# Patient Record
Sex: Female | Born: 1935 | Race: White | Hispanic: No | Marital: Married | Smoking: Former smoker
Health system: Southern US, Community
[De-identification: ages and names within clinical notes are randomized; demographics above are authoritative.]

## PROBLEM LIST (undated history)

## (undated) DIAGNOSIS — K529 Noninfective gastroenteritis and colitis, unspecified: Secondary | ICD-10-CM

## (undated) DIAGNOSIS — I4891 Unspecified atrial fibrillation: Secondary | ICD-10-CM

## (undated) DIAGNOSIS — I10 Essential (primary) hypertension: Secondary | ICD-10-CM

## (undated) DIAGNOSIS — G459 Transient cerebral ischemic attack, unspecified: Secondary | ICD-10-CM

## (undated) DIAGNOSIS — Z7901 Long term (current) use of anticoagulants: Secondary | ICD-10-CM

## (undated) DIAGNOSIS — I351 Nonrheumatic aortic (valve) insufficiency: Secondary | ICD-10-CM

## (undated) DIAGNOSIS — H9313 Tinnitus, bilateral: Secondary | ICD-10-CM

## (undated) DIAGNOSIS — K219 Gastro-esophageal reflux disease without esophagitis: Secondary | ICD-10-CM

## (undated) DIAGNOSIS — I639 Cerebral infarction, unspecified: Secondary | ICD-10-CM

## (undated) DIAGNOSIS — E785 Hyperlipidemia, unspecified: Secondary | ICD-10-CM

## (undated) DIAGNOSIS — C4331 Malignant melanoma of nose: Secondary | ICD-10-CM

## (undated) DIAGNOSIS — R0602 Shortness of breath: Secondary | ICD-10-CM

## (undated) DIAGNOSIS — M1990 Unspecified osteoarthritis, unspecified site: Secondary | ICD-10-CM

## (undated) DIAGNOSIS — H409 Unspecified glaucoma: Secondary | ICD-10-CM

## (undated) DIAGNOSIS — F419 Anxiety disorder, unspecified: Secondary | ICD-10-CM

## (undated) HISTORY — DX: Shortness of breath: R06.02

## (undated) HISTORY — DX: Noninfective gastroenteritis and colitis, unspecified: K52.9

## (undated) HISTORY — PX: TONSILLECTOMY: SUR1361

## (undated) HISTORY — DX: Nonrheumatic aortic (valve) insufficiency: I35.1

## (undated) HISTORY — DX: Malignant melanoma of nose: C43.31

## (undated) HISTORY — DX: Hyperlipidemia, unspecified: E78.5

## (undated) HISTORY — DX: Cerebral infarction, unspecified: I63.9

## (undated) HISTORY — DX: Long term (current) use of anticoagulants: Z79.01

## (undated) HISTORY — PX: BLEPHAROPLASTY: SUR158

## (undated) HISTORY — PX: OTHER SURGICAL HISTORY: SHX169

## (undated) HISTORY — DX: Unspecified osteoarthritis, unspecified site: M19.90

## (undated) HISTORY — DX: Unspecified atrial fibrillation: I48.91

## (undated) HISTORY — PX: CARDIOVERSION: SHX1299

## (undated) HISTORY — DX: Anxiety disorder, unspecified: F41.9

## (undated) HISTORY — PX: FRACTURE SURGERY: SHX138

## (undated) HISTORY — DX: Essential (primary) hypertension: I10

## (undated) HISTORY — DX: Gastro-esophageal reflux disease without esophagitis: K21.9

---

## 1997-09-09 ENCOUNTER — Ambulatory Visit (HOSPITAL_COMMUNITY): Admission: RE | Admit: 1997-09-09 | Discharge: 1997-09-09 | Payer: Self-pay | Admitting: Rheumatology

## 1997-11-04 ENCOUNTER — Other Ambulatory Visit: Admission: RE | Admit: 1997-11-04 | Discharge: 1997-11-04 | Payer: Self-pay | Admitting: Obstetrics & Gynecology

## 1998-11-30 ENCOUNTER — Other Ambulatory Visit: Admission: RE | Admit: 1998-11-30 | Discharge: 1998-11-30 | Payer: Self-pay | Admitting: Obstetrics & Gynecology

## 1999-01-05 ENCOUNTER — Ambulatory Visit (HOSPITAL_COMMUNITY): Admission: RE | Admit: 1999-01-05 | Discharge: 1999-01-05 | Payer: Self-pay | Admitting: Obstetrics & Gynecology

## 2001-08-04 ENCOUNTER — Encounter: Payer: Self-pay | Admitting: Orthopedic Surgery

## 2001-08-04 ENCOUNTER — Encounter: Payer: Self-pay | Admitting: Emergency Medicine

## 2001-08-04 ENCOUNTER — Inpatient Hospital Stay (HOSPITAL_COMMUNITY): Admission: EM | Admit: 2001-08-04 | Discharge: 2001-08-07 | Payer: Self-pay | Admitting: Emergency Medicine

## 2001-09-15 ENCOUNTER — Encounter: Payer: Self-pay | Admitting: Emergency Medicine

## 2001-09-15 ENCOUNTER — Inpatient Hospital Stay (HOSPITAL_COMMUNITY): Admission: EM | Admit: 2001-09-15 | Discharge: 2001-09-16 | Payer: Self-pay | Admitting: Emergency Medicine

## 2001-09-16 ENCOUNTER — Encounter: Payer: Self-pay | Admitting: Cardiology

## 2001-10-16 ENCOUNTER — Inpatient Hospital Stay (HOSPITAL_COMMUNITY): Admission: EM | Admit: 2001-10-16 | Discharge: 2001-10-17 | Payer: Self-pay

## 2002-02-11 ENCOUNTER — Other Ambulatory Visit: Admission: RE | Admit: 2002-02-11 | Discharge: 2002-02-11 | Payer: Self-pay | Admitting: Obstetrics & Gynecology

## 2002-08-26 ENCOUNTER — Encounter: Payer: Self-pay | Admitting: Rheumatology

## 2002-08-26 ENCOUNTER — Encounter: Admission: RE | Admit: 2002-08-26 | Discharge: 2002-08-26 | Payer: Self-pay | Admitting: Rheumatology

## 2002-09-25 ENCOUNTER — Encounter: Payer: Self-pay | Admitting: Rheumatology

## 2002-09-25 ENCOUNTER — Encounter: Admission: RE | Admit: 2002-09-25 | Discharge: 2002-09-25 | Payer: Self-pay | Admitting: Rheumatology

## 2002-12-12 ENCOUNTER — Encounter: Admission: RE | Admit: 2002-12-12 | Discharge: 2002-12-12 | Payer: Self-pay | Admitting: Internal Medicine

## 2003-01-28 ENCOUNTER — Observation Stay (HOSPITAL_COMMUNITY): Admission: RE | Admit: 2003-01-28 | Discharge: 2003-01-29 | Payer: Self-pay | Admitting: Orthopedic Surgery

## 2003-04-27 ENCOUNTER — Encounter (INDEPENDENT_AMBULATORY_CARE_PROVIDER_SITE_OTHER): Payer: Self-pay | Admitting: Specialist

## 2003-04-27 ENCOUNTER — Ambulatory Visit (HOSPITAL_COMMUNITY): Admission: RE | Admit: 2003-04-27 | Discharge: 2003-04-27 | Payer: Self-pay | Admitting: Gastroenterology

## 2004-03-29 ENCOUNTER — Ambulatory Visit: Payer: Self-pay | Admitting: Pulmonary Disease

## 2004-04-19 ENCOUNTER — Ambulatory Visit: Payer: Self-pay | Admitting: Internal Medicine

## 2004-05-04 ENCOUNTER — Emergency Department (HOSPITAL_COMMUNITY): Admission: EM | Admit: 2004-05-04 | Discharge: 2004-05-04 | Payer: Self-pay | Admitting: Emergency Medicine

## 2004-05-06 ENCOUNTER — Encounter: Admission: RE | Admit: 2004-05-06 | Discharge: 2004-05-06 | Payer: Self-pay | Admitting: Internal Medicine

## 2004-05-06 ENCOUNTER — Ambulatory Visit: Payer: Self-pay | Admitting: Internal Medicine

## 2004-05-18 ENCOUNTER — Ambulatory Visit: Payer: Self-pay | Admitting: Internal Medicine

## 2004-06-01 ENCOUNTER — Ambulatory Visit: Payer: Self-pay | Admitting: Internal Medicine

## 2004-06-02 ENCOUNTER — Encounter (INDEPENDENT_AMBULATORY_CARE_PROVIDER_SITE_OTHER): Payer: Self-pay | Admitting: *Deleted

## 2004-06-02 ENCOUNTER — Ambulatory Visit: Admission: RE | Admit: 2004-06-02 | Discharge: 2004-06-02 | Payer: Self-pay | Admitting: Internal Medicine

## 2004-06-02 ENCOUNTER — Ambulatory Visit: Payer: Self-pay | Admitting: Internal Medicine

## 2004-06-07 ENCOUNTER — Ambulatory Visit: Payer: Self-pay | Admitting: Internal Medicine

## 2004-07-20 ENCOUNTER — Ambulatory Visit: Payer: Self-pay | Admitting: Pulmonary Disease

## 2004-08-22 ENCOUNTER — Other Ambulatory Visit: Admission: RE | Admit: 2004-08-22 | Discharge: 2004-08-22 | Payer: Self-pay | Admitting: Obstetrics & Gynecology

## 2004-08-24 ENCOUNTER — Ambulatory Visit: Payer: Self-pay | Admitting: Internal Medicine

## 2004-10-06 ENCOUNTER — Ambulatory Visit: Payer: Self-pay | Admitting: Internal Medicine

## 2004-12-26 ENCOUNTER — Ambulatory Visit: Payer: Self-pay | Admitting: Internal Medicine

## 2005-01-24 ENCOUNTER — Ambulatory Visit: Payer: Self-pay | Admitting: Internal Medicine

## 2005-02-14 ENCOUNTER — Ambulatory Visit: Payer: Self-pay | Admitting: Internal Medicine

## 2005-02-24 ENCOUNTER — Ambulatory Visit: Payer: Self-pay | Admitting: Internal Medicine

## 2005-03-17 ENCOUNTER — Ambulatory Visit: Payer: Self-pay | Admitting: Internal Medicine

## 2005-04-03 ENCOUNTER — Ambulatory Visit (HOSPITAL_COMMUNITY): Admission: RE | Admit: 2005-04-03 | Discharge: 2005-04-03 | Payer: Self-pay | Admitting: Internal Medicine

## 2006-05-29 ENCOUNTER — Inpatient Hospital Stay (HOSPITAL_COMMUNITY): Admission: EM | Admit: 2006-05-29 | Discharge: 2006-05-31 | Payer: Self-pay | Admitting: Emergency Medicine

## 2006-05-30 ENCOUNTER — Ambulatory Visit: Payer: Self-pay | Admitting: Vascular Surgery

## 2006-07-13 ENCOUNTER — Ambulatory Visit: Payer: Self-pay | Admitting: Internal Medicine

## 2006-07-24 ENCOUNTER — Encounter: Admission: RE | Admit: 2006-07-24 | Discharge: 2006-07-24 | Payer: Self-pay | Admitting: Rheumatology

## 2007-03-04 ENCOUNTER — Emergency Department (HOSPITAL_COMMUNITY): Admission: EM | Admit: 2007-03-04 | Discharge: 2007-03-04 | Payer: Self-pay | Admitting: Emergency Medicine

## 2007-03-29 ENCOUNTER — Emergency Department (HOSPITAL_COMMUNITY): Admission: EM | Admit: 2007-03-29 | Discharge: 2007-03-29 | Payer: Self-pay | Admitting: Emergency Medicine

## 2007-04-05 ENCOUNTER — Ambulatory Visit (HOSPITAL_COMMUNITY): Admission: RE | Admit: 2007-04-05 | Discharge: 2007-04-05 | Payer: Self-pay | Admitting: Cardiovascular Disease

## 2008-08-14 ENCOUNTER — Encounter: Admission: RE | Admit: 2008-08-14 | Discharge: 2008-08-14 | Payer: Self-pay | Admitting: Family Medicine

## 2008-08-27 ENCOUNTER — Ambulatory Visit (HOSPITAL_COMMUNITY): Admission: RE | Admit: 2008-08-27 | Discharge: 2008-08-27 | Payer: Self-pay | Admitting: Family Medicine

## 2008-08-27 ENCOUNTER — Encounter (INDEPENDENT_AMBULATORY_CARE_PROVIDER_SITE_OTHER): Payer: Self-pay | Admitting: Family Medicine

## 2008-08-27 ENCOUNTER — Ambulatory Visit: Payer: Self-pay | Admitting: Vascular Surgery

## 2009-05-06 ENCOUNTER — Encounter: Admission: RE | Admit: 2009-05-06 | Discharge: 2009-05-06 | Payer: Self-pay | Admitting: Family Medicine

## 2009-05-13 ENCOUNTER — Encounter: Admission: RE | Admit: 2009-05-13 | Discharge: 2009-05-13 | Payer: Self-pay | Admitting: Gastroenterology

## 2009-10-13 ENCOUNTER — Ambulatory Visit: Payer: Self-pay | Admitting: Cardiology

## 2009-11-08 ENCOUNTER — Encounter: Payer: Self-pay | Admitting: Emergency Medicine

## 2009-11-10 ENCOUNTER — Ambulatory Visit: Payer: Self-pay | Admitting: Cardiovascular Disease

## 2009-12-08 ENCOUNTER — Ambulatory Visit: Payer: Self-pay | Admitting: Cardiovascular Disease

## 2009-12-13 DIAGNOSIS — I4891 Unspecified atrial fibrillation: Secondary | ICD-10-CM

## 2009-12-13 DIAGNOSIS — M109 Gout, unspecified: Secondary | ICD-10-CM

## 2009-12-13 DIAGNOSIS — K219 Gastro-esophageal reflux disease without esophagitis: Secondary | ICD-10-CM | POA: Insufficient documentation

## 2009-12-13 DIAGNOSIS — I10 Essential (primary) hypertension: Secondary | ICD-10-CM

## 2009-12-13 DIAGNOSIS — C4330 Malignant melanoma of unspecified part of face: Secondary | ICD-10-CM

## 2009-12-13 DIAGNOSIS — M810 Age-related osteoporosis without current pathological fracture: Secondary | ICD-10-CM | POA: Insufficient documentation

## 2009-12-13 DIAGNOSIS — G459 Transient cerebral ischemic attack, unspecified: Secondary | ICD-10-CM | POA: Insufficient documentation

## 2009-12-13 DIAGNOSIS — K5289 Other specified noninfective gastroenteritis and colitis: Secondary | ICD-10-CM | POA: Insufficient documentation

## 2009-12-13 DIAGNOSIS — E785 Hyperlipidemia, unspecified: Secondary | ICD-10-CM

## 2009-12-14 ENCOUNTER — Ambulatory Visit: Payer: Self-pay | Admitting: Emergency Medicine

## 2009-12-14 DIAGNOSIS — R05 Cough: Secondary | ICD-10-CM

## 2009-12-14 DIAGNOSIS — R059 Cough, unspecified: Secondary | ICD-10-CM | POA: Insufficient documentation

## 2009-12-14 DIAGNOSIS — R911 Solitary pulmonary nodule: Secondary | ICD-10-CM

## 2009-12-22 ENCOUNTER — Ambulatory Visit: Payer: Self-pay | Admitting: Cardiovascular Disease

## 2010-01-04 ENCOUNTER — Encounter: Admission: RE | Admit: 2010-01-04 | Discharge: 2010-01-04 | Payer: Self-pay | Admitting: Family Medicine

## 2010-01-05 ENCOUNTER — Ambulatory Visit: Payer: Self-pay | Admitting: Emergency Medicine

## 2010-01-19 ENCOUNTER — Ambulatory Visit: Payer: Self-pay | Admitting: Cardiovascular Disease

## 2010-02-11 ENCOUNTER — Ambulatory Visit: Payer: Self-pay | Admitting: Emergency Medicine

## 2010-02-23 ENCOUNTER — Ambulatory Visit: Payer: Self-pay | Admitting: Cardiology

## 2010-03-23 ENCOUNTER — Ambulatory Visit: Payer: Self-pay | Admitting: Cardiovascular Disease

## 2010-03-31 ENCOUNTER — Ambulatory Visit: Payer: Self-pay | Admitting: Cardiovascular Disease

## 2010-04-03 ENCOUNTER — Encounter: Payer: Self-pay | Admitting: Gastroenterology

## 2010-04-14 NOTE — Assessment & Plan Note (Signed)
Summary: cough, nodule   Visit Type:  Follow-up Copy to:  Antony Haste Primary Cleda Imel/Referring Mitchelle Sultan:  Dr. Antony Haste  CC:  Cough follow-up...the patient says her cough has improved...no longer using nasal spray due to nose bleeds.  History of Present Illness: 77 yo former smoker, HTN, A Fib on coumadin, GERD, chronic recurrent cough followed previously by Dr Sherene Sires. Worked up in the past with negative sinus CT scan in 2006, a negative chest CT scan in 2006, a negative bronchoscopy also in 2006, with no evidence of eosinophilia and a negative methacholine challenge test in January of 2007. PFT in 03/2005 showed mixed moderate obstruction and restriction.   12/14/09 -- Presents back for dry cough that began this Summer. Exposed to URI in August, developed scratchy throat, nasal drainage and congestion. Began to have cough prod of greenish mucous. Was treated w symptomatic relief and abx x 3. The nasal symptoms improved but left w dry cough. Bothers her all day, doesn't wake her from sleep. + globus sensation, + throat clearing. Occas nasal congestion. No real dyspnea, able to maintain her exercise regimen. CXR showed clear lungs, R parasternal nodule  01/04/10 -- 77 yo, chronic cough. Taking loratadine, stopped the nasonex 2 days ago due to nose bleed. Has taken the prilosec two times a day up until today. Cough is much better! Worked hard on not throat clearing. Planning for repeat CXR Dec  per Dr Cyndia Bent.   Current Medications (verified): 1)  Plavix 75 Mg Tabs (Clopidogrel Bisulfate) .Marland Kitchen.. 1 By Mouth Daily 2)  Zocor 40 Mg Tabs (Simvastatin) .Marland Kitchen.. 1 By Mouth Daily 3)  Toprol Xl 50 Mg Xr24h-Tab (Metoprolol Succinate) .Marland Kitchen.. 1 By Mouth Daily 4)  Digoxin 0.25 Mg Tabs (Digoxin) .Marland Kitchen.. 1 By Mouth Daily 5)  Benicar 20 Mg Tabs (Olmesartan Medoxomil) .Marland Kitchen.. 1 By Mouth Daily 6)  Coumadin 5 Mg Tabs (Warfarin Sodium) .... Use As Directed 7)  Prilosec 20 Mg Cpdr (Omeprazole) .Marland Kitchen.. 1 By Mouth Daily 8)   Centrum  Tabs (Multiple Vitamins-Minerals) .Marland Kitchen.. 1 By Mouth Daily 9)  Citracal Plus  Tabs (Multiple Minerals-Vitamins) .Marland Kitchen.. 1 By Mouth Daily 10)  Vitamin D 2000 Unit Tabs (Cholecalciferol) .Marland Kitchen.. 1 By Mouth Every Other Day 11)  Aspirin 81 Mg Tbec (Aspirin) .Marland Kitchen.. 1 By Mouth Daily 12)  Loratadine 10 Mg Tabs (Loratadine) .Marland Kitchen.. 1 By Mouth Once Daily  Allergies (verified): 1)  ! * Levaquin 2)  ! * Fosamax 3)  ! * Tramadol 4)  ! * Sulfa  Vital Signs:  Patient profile:   77 year old female Height:      67 inches (170.18 cm) Weight:      143 pounds (65 kg) BMI:     22.48 O2 Sat:      96 % on Room air Temp:     98.0 degrees F (36.67 degrees C) oral Pulse rate:   77 / minute BP sitting:   128 / 74  (left arm) Cuff size:   regular  Vitals Entered By: Michel Bickers CMA (January 05, 2010 1:25 PM)  O2 Sat at Rest %:  96 O2 Flow:  Room air CC: Cough follow-up...the patient says her cough has improved...no longer using nasal spray due to nose bleeds Comments Medications reviewed with patient Michel Bickers CMA  January 05, 2010 1:35 PM   Physical Exam  General:  normal appearance and healthy appearing.   Head:  normocephalic and atraumatic Eyes:  conjunctiva and sclera clear Nose:  no deformity, discharge, inflammation, or  lesions Mouth:  no deformity or lesions Neck:  no masses, thyromegaly, or abnormal cervical nodes, no stridor Lungs:  clear bilaterally to auscultation Heart:  regular rate and rhythm, S1, S2 without murmurs, rubs, gallops, or clicks Abdomen:  not examined Msk:  no deformity or scoliosis noted with normal posture Extremities:  no clubbing, cyanosis, edema, or deformity noted Neurologic:  non-focal Skin:  intact without lesions or rashes Psych:  alert and cooperative; normal mood and affect; normal attention span and concentration   Impression & Recommendations:  Problem # 1:  COUGH (ICD-786.2)  Orders: Est. Patient Level IV (04540)  Problem # 2:  PULMONARY NODULE  (ICD-518.89)  f/u CXR next time.   Orders: Est. Patient Level IV (98119)  Medications Added to Medication List This Visit: 1)  Zocor 40 Mg Tabs (Simvastatin) .Marland Kitchen.. 1 by mouth daily  Patient Instructions: 1)  Decrease omeprazole 20mg  to once daily  2)  Continue the loratadine 10mg  once daily  3)  Stop nasonex.  4)  Avoid throat clearing if possible 5)  Get your CXR as planned 6)  Follow up with Dr Delton Coombes in December. Please bring your CXRs with you to your appointment.    Immunization History:  Influenza Immunization History:    Influenza:  historical (12/29/2009)

## 2010-04-14 NOTE — Assessment & Plan Note (Signed)
Summary: cough, nodule   Visit Type:  Initial Consult Copy to:  Antony Haste Primary Provider/Referring Provider:  Dr. Antony Haste  CC:  Pulmonary consult...last seen by Dr. Sherene Sires in 2008...here for cough.  History of Present Illness: 77 yo former smoker, HTN, A Fib on coumadin, GERD, chronic recurrent cough followed previously by Dr Sherene Sires. Worked up in the past with negative sinus CT scan in 2006, a negative chest CT scan in 2006, a negative bronchoscopy also in 2006, with no evidence of eosinophilia and a negative methacholine challenge test in January of 2007. PFT in 03/2005 showed mixed moderate obstruction and restriction.   12/14/09 -- Presents back for dry cough that began this Summer. Exposed to URI in August, developed scratchy throat, nasal drainage and congestion. Began to have cough prod of greenish mucous. Was treated w symptomatic relief and abx x 3. The nasal symptoms improved but left w dry cough. Bothers her all day, doesn't wake her from sleep. + globus sensation, + throat clearing. Occas nasal congestion. No real dyspnea, able to maintain her exercise regimen. CXR showed clear lungs, R parasternal nodule.   Preventive Screening-Counseling & Management  Alcohol-Tobacco     Smoking Status: quit     Packs/Day: 0.75     Year Started: 1972     Year Quit: 1992     Pack years: 15  Current Medications (verified): 1)  Plavix 75 Mg Tabs (Clopidogrel Bisulfate) .Marland Kitchen.. 1 By Mouth Daily 2)  Zocor 80 Mg Tabs (Simvastatin) .Marland Kitchen.. 1 By Mouth Daily 3)  Toprol Xl 50 Mg Xr24h-Tab (Metoprolol Succinate) .Marland Kitchen.. 1 By Mouth Daily 4)  Digoxin 0.25 Mg Tabs (Digoxin) .Marland Kitchen.. 1 By Mouth Daily 5)  Benicar 20 Mg Tabs (Olmesartan Medoxomil) .Marland Kitchen.. 1 By Mouth Daily 6)  Coumadin 5 Mg Tabs (Warfarin Sodium) .... Use As Directed 7)  Prilosec 20 Mg Cpdr (Omeprazole) .Marland Kitchen.. 1 By Mouth Daily 8)  Centrum  Tabs (Multiple Vitamins-Minerals) .Marland Kitchen.. 1 By Mouth Daily 9)  Citracal Plus  Tabs (Multiple Minerals-Vitamins)  .Marland Kitchen.. 1 By Mouth Daily 10)  Vitamin D 2000 Unit Tabs (Cholecalciferol) .Marland Kitchen.. 1 By Mouth Every Other Day 11)  Aspirin 81 Mg Tbec (Aspirin) .Marland Kitchen.. 1 By Mouth Daily  Allergies (verified): 1)  ! * Levaquin 2)  ! * Fosamax 3)  ! * Tramadol 4)  ! 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  Smoking Status:  quit Packs/Day:  0.75 Pack years:  15  Vital Signs:  Patient profile:   77 year old female Height:      67 inches (170.18 cm) Weight:      142 pounds (64.55 kg) BMI:     22.32 O2 Sat:      97 % on Room air Temp:     97.6 degrees F (36.44 degrees C) oral Pulse rate:   91 / minute BP sitting:   130 / 82  (left arm) Cuff size:   regular  Vitals Entered By: Michel Bickers CMA (December 14, 2009 8:54 AM)  O2 Sat at Rest %:  97 O2 Flow:  Room air CC: Pulmonary consult...last seen by Dr. Sherene Sires in 2008...here for cough Comments Medications reviewed with patient Daytime phone verified. Michel Bickers CMA  December 14, 2009 9:04 AM   Physical Exam  General:  normal appearance and healthy appearing.   Head:  normocephalic and atraumatic Eyes:  conjunctiva and sclera clear Nose:  no deformity, discharge, inflammation, or lesions Mouth:  no deformity or lesions Neck:  no masses, thyromegaly, or abnormal cervical nodes, no stridor Lungs:  clear bilaterally to auscultation Heart:  regular rate and rhythm, S1, S2 without murmurs, rubs, gallops, or clicks Abdomen:  not examined Msk:  no deformity or scoliosis noted with normal posture Extremities:  no clubbing, cyanosis, edema, or deformity noted Neurologic:  non-focal Skin:  intact without lesions or rashes Psych:  alert and cooperative; normal mood and affect; normal attention span and concentration   Impression & Recommendations:  Problem # 1:  COUGH (ICD-786.2)  Started with URI, likely being drven by mild GERD and allergies. She is on Benicar which could be a minor contributor. Also  has mod AFL on PFT's from 2007 whoich could sustain cough.  - treat allergies and GERD empirically - follow up in 3 -4 weeks  - consider BD's if cough persists given her prior PFTs - consider repeat PFTs to compare w 2007  Orders: Prescription Created Electronically [phone]) Consultation Level IV [phone])  Problem # 2:  PULMONARY NODULE (ICD-518.89)  Planning for repeat CXR in December, will forward the films to me  Orders: Consultation Level IV (09811)  Medications Added to Medication List This Visit: 1)  Aspirin 81 Mg Tbec (Aspirin) .Marland Kitchen.. 1 by mouth daily 2)  Loratadine 10 Mg Tabs (Loratadine) .Marland Kitchen.. 1 by mouth once daily  Patient Instructions: 1)  Increase your Prilosec to two times a day for the next 2 weeks, then go back to once daily  2)  Start loratadine 10mg  once daily until next visit 3)  Start Nasonex 2 sprays each nostril once daily  4)  Repeat CXR on Dec 1,2011 as planned and have films forwarded to Dr Delton Coombes.  5)  Avoid throat clearing if at all possible.  6)  Try to rest your voice.  7)  Follow up with Dr Delton Coombes in 3 -4 weeks.  Prescriptions: LORATADINE 10 MG TABS (LORATADINE) 1 by mouth once daily  #30 x 3   Entered and Authorized by:   Leslye Peer MD   Signed by:   Leslye Peer MD on 12/14/2009   Method used:   Electronically to        [facility] [address] [phone]* (retail)       [address]       [place], [place]  [address]       Ph: [phone] or [phone]       Fax: [phone]   RxID:   [id number]

## 2010-04-14 NOTE — Assessment & Plan Note (Signed)
Summary: cough, pulmonary nodule   Visit Type:  Follow-up Copy to:  Antony Haste Primary Provider/Referring Provider:  Dr. Antony Haste  CC:  2 month followup with cxr.  Pt states that her cough has improved.  Only has occ dry cough.  No new complaints today.Marland Kitchen  History of Present Illness: 77 yo former smoker, HTN, A Fib on coumadin, GERD, chronic recurrent cough followed previously by Dr Sherene Sires. Worked up in the past with negative sinus CT scan in 2006, a negative chest CT scan in 2006, a negative bronchoscopy also in 2006, with no evidence of eosinophilia and a negative methacholine challenge test in January of 2007. PFT in 03/2005 showed mixed moderate obstruction and restriction.   12/14/09 -- Presents back for dry cough that began this Summer. Exposed to URI in August, developed scratchy throat, nasal drainage and congestion. Began to have cough prod of greenish mucous. Was treated w symptomatic relief and abx x 3. The nasal symptoms improved but left w dry cough. Bothers her all day, doesn't wake her from sleep. + globus sensation, + throat clearing. Occas nasal congestion. No real dyspnea, able to maintain her exercise regimen. CXR showed clear lungs, R parasternal nodule  01/04/10 -- 77 yo, chronic cough. Taking loratadine, stopped the nasonex 2 days ago due to nose bleed. Has taken the prilosec two times a day up until today. Cough is much better! Worked hard on not throat clearing. Planning for repeat CXR Dec per Dr Cyndia Bent.   ROV 02/11/10 -- 77 yo woman, chronic cough, better on prilosec two times a day. Last time we stopped nasonex and decreased omeprazole to once daily. Still w cough but not as much, not bothering her. She had CXR in 10/11 that showed R parasternal nodule, her prior films and today's film show chronic R mediastinal LAD.   Current Medications (verified): 1)  Plavix 75 Mg Tabs (Clopidogrel Bisulfate) .Marland Kitchen.. 1 By Mouth Daily 2)  Zocor 40 Mg Tabs (Simvastatin) .Marland Kitchen.. 1 By Mouth  Daily 3)  Toprol Xl 50 Mg Xr24h-Tab (Metoprolol Succinate) .Marland Kitchen.. 1 By Mouth Daily 4)  Digoxin 0.25 Mg Tabs (Digoxin) .Marland Kitchen.. 1 By Mouth Daily 5)  Benicar 20 Mg Tabs (Olmesartan Medoxomil) .Marland Kitchen.. 1 By Mouth Daily 6)  Coumadin 5 Mg Tabs (Warfarin Sodium) .... Use As Directed 7)  Prilosec 20 Mg Cpdr (Omeprazole) .Marland Kitchen.. 1 By Mouth Daily 8)  Centrum  Tabs (Multiple Vitamins-Minerals) .Marland Kitchen.. 1 By Mouth Daily 9)  Citracal Plus  Tabs (Multiple Minerals-Vitamins) .Marland Kitchen.. 1 By Mouth Daily 10)  Vitamin D 2000 Unit Tabs (Cholecalciferol) .Marland Kitchen.. 1 By Mouth Every Other Day 11)  Aspirin 81 Mg Tbec (Aspirin) .Marland Kitchen.. 1 By Mouth Daily  Allergies (verified): 1)  ! * Levaquin 2)  ! * Fosamax 3)  ! * Tramadol 4)  ! * Sulfa  Vital Signs:  Patient profile:   77 year old female Weight:      148 pounds O2 Sat:      97 % on Room air Temp:     97.5 degrees F oral Pulse rate:   75 / minute BP sitting:   152 / 80  (left arm)  Vitals Entered By: Vernie Murders (February 11, 2010 2:26 PM)  O2 Flow:  Room air  Physical Exam  General:  normal appearance and healthy appearing.   Head:  normocephalic and atraumatic Eyes:  conjunctiva and sclera clear Nose:  no deformity, discharge, inflammation, or lesions Mouth:  no deformity or lesions Neck:  no masses,  thyromegaly, or abnormal cervical nodes, no stridor Lungs:  clear bilaterally to auscultation Heart:  regular rate and rhythm, S1, S2 without murmurs, rubs, gallops, or clicks Abdomen:  not examined Msk:  no deformity or scoliosis noted with normal posture Extremities:  no clubbing, cyanosis, edema, or deformity noted Neurologic:  non-focal Skin:  intact without lesions or rashes Psych:  alert and cooperative; normal mood and affect; normal attention span and concentration   Impression & Recommendations:  Problem # 1:  PULMONARY NODULE (ICD-518.89) Chronic granulomatous disease noted on today's CXR, no different from multiple prior films. Consistent with histo,  nothing to support malignancy.   Problem # 2:  COUGH (ICD-786.2)  - stopped nasal steroid - stopped loratadine 1 weeks  ago.  - omeprazole appears to be helping, no real drop off since decrease to once daily   Orders: Est. Patient Level III (57846)  Other Orders: T-2 View CXR ([id number])  Patient Instructions: 1)  Continue your prilosec (omeprazole) once daily  2)  If your cough returns, restart loratadine 10mg  once daily  3)  Your CXR is stable, unchanged from prior films, and shows evidence for old lymph node inflammation.  4)  Follow up with Dr Delton Coombes as needed

## 2010-04-21 ENCOUNTER — Other Ambulatory Visit: Payer: Self-pay

## 2010-04-26 ENCOUNTER — Ambulatory Visit (INDEPENDENT_AMBULATORY_CARE_PROVIDER_SITE_OTHER): Payer: Medicare Other | Admitting: *Deleted

## 2010-04-26 DIAGNOSIS — I10 Essential (primary) hypertension: Secondary | ICD-10-CM

## 2010-05-10 ENCOUNTER — Telehealth: Payer: Self-pay | Admitting: *Deleted

## 2010-05-10 NOTE — Telephone Encounter (Signed)
Pt called

## 2010-05-24 ENCOUNTER — Ambulatory Visit: Payer: Medicare Other | Admitting: Nurse Practitioner

## 2010-05-26 ENCOUNTER — Encounter: Payer: Self-pay | Admitting: Cardiovascular Disease

## 2010-05-26 ENCOUNTER — Ambulatory Visit (INDEPENDENT_AMBULATORY_CARE_PROVIDER_SITE_OTHER): Payer: Medicare Other | Admitting: Cardiovascular Disease

## 2010-05-26 DIAGNOSIS — I10 Essential (primary) hypertension: Secondary | ICD-10-CM

## 2010-05-26 DIAGNOSIS — I4891 Unspecified atrial fibrillation: Secondary | ICD-10-CM

## 2010-05-26 DIAGNOSIS — I119 Hypertensive heart disease without heart failure: Secondary | ICD-10-CM

## 2010-05-26 DIAGNOSIS — Z7901 Long term (current) use of anticoagulants: Secondary | ICD-10-CM

## 2010-05-26 NOTE — Progress Notes (Signed)
Subjective: Natalie Ho an elderly female with a history of atrial fibrillation, remote stroke, aortic insufficiency, hypertension, and hypercholesterolemia. She presents today for followup of her hypertension. She has been seeing our nurse practitioner for labile blood pressure readings.She's been taking an extra One half tablet of Bystolic at night occasionally.  She has occasional episodes of shortness of breath. She also has occasional headaches. She denies any chest pain or dizziness.  Current medications: Benicar 20 mg twice a day Prilosec 20 mg a day Digoxin 0.5 mg a day Bystolic 5 mg once or twice a day Zoloft 50 mg a day Multivitamin once a day Aspirin 81 mg a day Vitamin D 2000 international units every other day Citracal once a day Coumadin 5 mg 5 days week, 5 mg 2 days week Atorvastatin 20 mg a day Plavix 75 mg a day Amlodipine 5 mg a day  Allergies  Allergen Reactions  . Alendronate Sodium   . Levofloxacin   . Sulfonamide Derivatives   . Tramadol     Patient Active Problem List  Diagnoses  . MELANOMA, NOSE  . HYPERLIPIDEMIA  . GOUT  . HYPERTENSION  . ATRIAL FIBRILLATION  . TIA  . PULMONARY NODULE  . G E R D  . COLITIS  . OSTEOPOROSIS  . COUGH    History  Smoking status  . Former Smoker  Smokeless tobacco  . Not on file    History  Alcohol Use  . 2.5 oz/week  . 5 drink(s) per week    No family history on file.  Review of Systems - Noted in the history of present illness. All other systems are negative  Physical Exam: The patient is alert and oriented x 3.  The mood and affect are normal.The HEENT exam reveals that the sclera are nonicteric.  The mucous membranes are moist.  The carotids are 2+ without bruits.  There is no thyromegaly.  There is no JVD.  The lungs are clear.  The chest wall is non tender.  The heart exam reveals an irregular rate with a normal S1 and S2.  There are no murmurs, gallops, or rubs.  The PMI is not displaced.   Abdominal exam  reveals good bowel sounds.  There is no guarding or rebound.  There is no hepatosplenomegaly or tenderness.  There are no masses.  Exam of the legs reveal no clubbing, cyanosis, or edema.  The legs are without rashes.  The distal pulses are intact.  Cranial nerves II - XII are intact.  Motor and sensory functions are intact.  The gait is normal.  Her INR levels have been therapeutic.

## 2010-05-26 NOTE — Patient Instructions (Signed)
Return office visit in 3

## 2010-05-26 NOTE — Assessment & Plan Note (Signed)
Her blood pressure readings have been somewhat variable. I think part of this because she is taking a different dose of blood pressure medicine every week. I've asked her to stick with a Dosage regimen. We'll evaluate that at her next visit. She is to call me with her systolic blood pressure goes above 200 if it goes below 80. We will have her take Bystolic 5 once in the morning.

## 2010-05-26 NOTE — Assessment & Plan Note (Addendum)
Her atrial fibrillation seems to be well-controlled.She does have some shortness of breath particularly with exertion. I suspect that her heart rate might be going fast. We'll place a Holter monitor on her. She does not want to get it today but she will return in several days. Alternatively may do a treadmill test as the heart rate responses to exercise. I'll see her again in the office in 3 months.

## 2010-05-30 ENCOUNTER — Other Ambulatory Visit: Payer: Medicare Other

## 2010-06-01 ENCOUNTER — Ambulatory Visit (INDEPENDENT_AMBULATORY_CARE_PROVIDER_SITE_OTHER): Payer: Medicare Other | Admitting: *Deleted

## 2010-06-01 DIAGNOSIS — R5383 Other fatigue: Secondary | ICD-10-CM

## 2010-06-01 DIAGNOSIS — I4891 Unspecified atrial fibrillation: Secondary | ICD-10-CM

## 2010-06-01 DIAGNOSIS — R0602 Shortness of breath: Secondary | ICD-10-CM

## 2010-06-07 ENCOUNTER — Telehealth: Payer: Self-pay | Admitting: *Deleted

## 2010-06-07 ENCOUNTER — Other Ambulatory Visit: Payer: Self-pay | Admitting: Cardiovascular Disease

## 2010-06-07 DIAGNOSIS — I4891 Unspecified atrial fibrillation: Secondary | ICD-10-CM

## 2010-06-07 NOTE — Telephone Encounter (Signed)
Pt called and informed hr was well controlled through holter monitoring. Pt states feeling well today and will set up follow up app soon.

## 2010-06-09 NOTE — Telephone Encounter (Signed)
[facility] °

## 2010-06-23 ENCOUNTER — Ambulatory Visit (INDEPENDENT_AMBULATORY_CARE_PROVIDER_SITE_OTHER): Payer: Medicare Other | Admitting: *Deleted

## 2010-06-23 DIAGNOSIS — R5383 Other fatigue: Secondary | ICD-10-CM

## 2010-06-23 DIAGNOSIS — I4891 Unspecified atrial fibrillation: Secondary | ICD-10-CM

## 2010-06-23 DIAGNOSIS — R0602 Shortness of breath: Secondary | ICD-10-CM

## 2010-07-19 ENCOUNTER — Other Ambulatory Visit: Payer: Self-pay | Admitting: *Deleted

## 2010-07-19 DIAGNOSIS — E7800 Pure hypercholesterolemia, unspecified: Secondary | ICD-10-CM

## 2010-07-22 ENCOUNTER — Ambulatory Visit (INDEPENDENT_AMBULATORY_CARE_PROVIDER_SITE_OTHER): Payer: Medicare Other | Admitting: *Deleted

## 2010-07-22 DIAGNOSIS — I4891 Unspecified atrial fibrillation: Secondary | ICD-10-CM

## 2010-07-26 NOTE — Assessment & Plan Note (Signed)
[facility]                             PULMONARY OFFICE NOTE   NAME:LINDEMEYERBrayla, Pat                     MRN:          [id number]  DATE:07/13/2006                            DOB:          April 22, 1930    PULMONARY/ACUTE EXTENDED OFFICE VISIT:   HISTORY:  A 77 year old white female with a recurrent pattern of cough  dating back at least 4 years, worked up in the past with negative sinus  CT scan in 2006, a negative chest CT scan in 2006, a negative  bronchoscopy also in 2006, with no evidence of eosinophilia and a  negative methacholine challenge test in January of 2007.  She has not  been seen since that time and reports that she was doing 100% for a  year, for about a month ago caught a cold that went into her chest and  has been present ever since.  She is coughing up slightly yellowish  sputum.  The cough is more prominent during the day, and also on lying  down.  It is not associated with any fevers, chills, sweats, unintended  weight loss, overt sinus or reflux symptoms at present.  No orthopnea,  PND or leg swelling.   Also she takes Prilosec daily, she did not remember to double the dose  as previously recommended, and has been taking Robitussin with limited  benefit.   PHYSICAL EXAMINATION:  GENERAL:  She is a pleasant, ambulatory white  female in no acute distress.  VITAL SIGNS:  Stable.  HEENT:  Unremarkable.  Oropharynx clear.  LUNGS:  Lung fields reveal end expiratory rhonchi bilaterally, but a  prominent pseudo-wheeze is present.  HEART:  Regular rhythm without murmur, gallop, or rub.  ABDOMEN:  Soft, benign.  EXTREMITIES:  Warm without calf tenderness, clubbing, cyanosis, or  edema.   MEDICATIONS:  Taken in detail on the progress note dated Jul 13, 2006.  Significantly, she takes p.r.n. propranolol, but says she has not used  it in over a month.   CHEST X-RAY:  The chest x-ray today showed no definite abnormalities.   IMPRESSION:  Classic upper airway cough syndrome, perhaps initially  triggered by a head and chest cold with persistent slightly discolored  sputum.  This suggests the possibility of sinusitis, but note that we  have ruled out chronic sinus disease previously, as well as asthma and  most likely at least a component of her cough is related to a cyclical  phenomenon.   RECOMMENDATIONS:  Cyclical pattern of reflux and pseudo-wheeze.  1. I recommend, therefore, the following:  Prilosec 20 mg tablets      taken 30 to 60 minutes before the first meal and last meal until      100% better.  Reglan also 10 mg tablets one-half before eating and      at bedtime until 100% better, and then eliminate Reglan and      continue Prilosec on a maintenance basis.  2. I emphasized the optimal use of diet (I noticed that she had been      using lots of cough drops which have menthol,  which I have strongly      discouraged; I recommended she use no mint or menthol products and      use ice and sugarless candy instead).  3. For cough, use Mucinex DM 1-2 every 12 hours.  4. Because she has purulent sputum, I recommended a 10-day course of      Augmentin and a follow up sinus CT scan if not improved within 2      weeks (number given to call to schedule the appointment).   The patient's chart arrived after she left, indicating that shefelt  dizzy on tramadol, which had been recommended in the past every 4 hours  p.r.n. extensive coughing.  I will check by phone to make sure she is  tolerating this or offer alternative therapy.     Charlaine Dalton. Sherene Sires, MD, [facility]  Electronically Signed    MBW/MedQ  DD: 07/16/2006  DT: 07/16/2006  Job #: [id number]   cc:   Demetria Pore. Coral Spikes, M.D.

## 2010-07-29 NOTE — H&P (Signed)
NAME:  CHARISMA, CHARLOT NO.:  [id number]   MEDICAL RECORD NO.:  [id number]                   PATIENT TYPE:  INP   LOCATION:  4740                                 FACILITY:  [facility]   PHYSICIAN:  Vesta Mixer, M.D.              DATE OF BIRTH:  07/27/1930   DATE OF ADMISSION:  10/16/2001  DATE OF DISCHARGE:  10/17/2001                                HISTORY & PHYSICAL   Ms. Morace is a 77 year old white female with a history of intermittent  atrial fibrillation and recent hip surgery, who is admitted with atrial  fibrillation.   The patient has a history of atrial fibrillation diagnosed last month.  She  had been relatively healthy with the exception of some hyperlipidemia.  She  presented to the emergency room with palpitations and was found to have  intermittent atrial fibrillation.  The atrial fibrillation resolved fairly  quickly after being started on a Cardizem drip.  She did fairly well and  awoke this morning around 3 a.m. with tachycardic palpations.  She was taken  to the emergency room and was found to have recurrent atrial fibrillation.   She denies any episodes of chest pain or shortness of breath.  She does have  some weakness.  She denies any syncope or presyncope.   CURRENT MEDICATIONS:  1. Lipitor 10 mg a day.  2. Toprol XL 25 mg a day.  3. Cipro twice a day.   ALLERGIES:  No known drug allergies   PAST MEDICAL HISTORY:  1. History of hip fracture.  2. Hypercholesterolemia.  3. Intermittent atrial fibrillation.   SOCIAL HISTORY:  The patient is a nonsmoker.  She drinks wine occasionally.   FAMILY HISTORY:  Positive for cardiac disease.   REVIEW OF SYSTEMS:  Was reviewed and is negative except as noted in the HPI.   PHYSICAL EXAMINATION:  GENERAL:  She is a middle-aged female in no acute  distress.  She is alert and oriented x 3, and her mood and affect are  normal.  VITAL SIGNS:  Heart rate 100, blood pressure  110/71,  NECK:  Exam reveals 2+ carotids.  She has no bruits.  There is no JVD.  LUNGS:  Clear to auscultation.  HEART:  Irregularly irregular.  She has no murmurs, gallops, or rubs.  ABDOMEN:  Good bowel sounds and is nontender.  EXTREMITIES:  She has good pulses.  There is no calf tenderness.  She has no  clubbing, cyanosis, or edema.  NEUROLOGIC:  Nonfocal.  Cranial nerves II-XII intact, and motor and sensory  functions are intact.   LABORATORY DATA:  Pending.   EKG reveals atrial fibrillation with a rapid ventricular response.   ASSESSMENT:  Ms. Lepak presents with recurrent atrial fibrillation.  We  will start her on a Cardizem drip.  We will consider heparin with possible  Coumadin.  This is her second episode of atrial  fibrillation in a month.  We  will also add digoxin 0.25 mg a day and will consider increasing Toprol  versus adding p.o. Cardizem.  She has not had any cardiac symptoms, so I do  not think that this represents a myocardial infarction.                                               Vesta Mixer, M.D.    PJN/MEDQ  D:  10/16/2001  T:  10/20/2001  Job:  [id number]   cc:   Demetria Pore. Coral Spikes, M.D.

## 2010-07-29 NOTE — Consult Note (Signed)
[place]. [facility]  Patient:    Natalie Ho, Natalie Ho Visit Number: [id number] MRN: [id number]          Service Type: MED Location: 2000 2009 01 Attending Physician:  Julieanne Manson Dictated by:   Alvia Grove., M.D. Proc. Date: 09/15/01 Admit Date:  09/15/2001 Discharge Date: 09/16/2001   CC:         Demetria Pore. Coral Spikes, M.D.   Consultation Report  REASON FOR CONSULTATION:  The patient is a relatively healthy 77 year old female with a history of hyperlipidemia. She presents to the emergency room with palpitations and dyspnea.  HISTORY OF PRESENT ILLNESS:  The patient has been relatively healthy. She has had hyperlipidemia which has been fairly well controlled. She recently fell and broke her hip when she stumbled while running. She was treated with Coumadin for several weeks for a DVT prophylaxis. She has been under considerable stress recently with the christening of a grandchild and her husbands illness (he recently developed blindness in the right eye). Today, she developed tachypalpitations and dyspnea. She presented to the emergency room and was found to have atrial fibrillation with a rapid ventricular response. We are consulted for further management.  She denies any chest pain or shortness of breath. She is somewhat fatigued. She denies any syncope or presyncopal episodes.  CURRENT MEDICATIONS:  Lipitor 10 mg a day.  ALLERGIES:  No known drug allergies.  PAST MEDICAL HISTORY: 1. Hypercholesterolemia. 2. Recent hip fracture.  SOCIAL HISTORY:  The patient is a nonsmoker. She drinks wine occasionally.  FAMILY HISTORY:  Positive for cardiac disease.  REVIEW OF SYSTEMS:  Was reviewed. It is as per the HPI. It is otherwise negative.  PHYSICAL EXAMINATION:  GENERAL:  She is an elderly female in no acute distress. She is alert and oriented x3 and her mood and affect are normal.  VITAL SIGNS:  Heart rate 110 (on Cardizem). Her  blood pressure is 140/100. She is afebrile.  HEENT:  Reveals 2+ carotids. She has no bruits. There is no JVD.  LUNGS:  Clear to auscultation.  HEART:  Irregularly irregular. She has no murmurs, rubs or gallops.  ABDOMEN:  Good bowel sounds and was nontender.  EXTREMITIES:  She has no cyanosis, clubbing, or edema. She has no palpable cords. Her pulses are intact.  NEUROLOGICAL:  Nonfocal. Cranial nerves II-XII are intact and her motor and sensory function are intact.  LABORATORY AND ACCESSORY DATA:  Her EKG reveals atrial fibrillation with a rapid ventricular response.  Her laboratory and chest x-ray are pending.  IMPRESSION:  The patient presents with atrial fibrillation with a rapid ventricular response. She is currently on a Cardizem drip. We will titrate the drip to aim for a heart rate between 70 and 80. I agree with her Lovenox and Coumadin for now. We will get an echocardiogram in the morning or perhaps as an outpatient. We will also start her on low-dose Toprol.  Her hypercholesterolemia is well controlled. We will continue with her current dose of Lipitor. Dictated by:   Alvia Grove., M.D. Attending Physician:  Julieanne Manson DD:  09/15/01 TD:  09/17/01 Job: [id number]

## 2010-07-29 NOTE — Discharge Summary (Signed)
NAME:  Natalie Ho, Natalie Ho NO.:  [id number]   MEDICAL RECORD NO.:  [id number]                   PATIENT TYPE:  INP   LOCATION:  2009                                 FACILITY:  [facility]   PHYSICIAN:  Marcene Duos, M.D.         DATE OF BIRTH:  [birth year]   DATE OF ADMISSION:  09/15/2001  DATE OF DISCHARGE:  09/16/2001                                 DISCHARGE SUMMARY   ADMISSION DIAGNOSES:  1. Atrial fibrillation, new onset.  2. Hyperlipidemia.  3. Recent left hip fracture and repair.   DISCHARGE DIAGNOSES:  1. Atrial fibrillation, new onset.  2. Hyperlipidemia.  3. Recent left hip fracture and repair.   CONSULTATIONS:  Dr. Elease Hashimoto, Cardiology.   HISTORY OF PRESENT ILLNESS AND HOSPITAL COURSE:  This is a 77 year old with  history of hypercholesterolemia and left hip pain six weeks ago following  left hip fracture who presented to the emergency room with a history of an  irregular heartbeat with a rate ranging from 77 to 144 since 6:00 a.m. the  day of admission.  Denied any history of shortness of breath, chest pain or  lightheadedness associated with this.  She has had a bit of fatigue and  anxiousness.  She described a similar episode one year ago that lasted  around two hours and resolved on its own.  She did not receive medical  attention at that time.   FINDINGS:  Patient states that she was on Coumadin for two weeks  postoperatively from her hip pinning six weeks ago.  She has had some  cramping in the left thigh since.  On examination in the emergency room her  vitals showed heart rate of 90-120 on 5 mg of Cardizem IV, respiratory rate  was 20, O2 saturations 99% on room air, blood pressure 122/81.  EKG showed  atrial fibrillation without definite ischemic changes.  She had an  occasional PVC versus supraventricular aberrantly conducted complex.  Electrolytes were normal except for mildly elevated glucose at 123.  Hemoglobin was 14.3.   On cardiac examination her rhythm was regularly  irregular with variable rate as noted previously.  Lungs were clear.  CT  scan of the chest and thighs was obtained to rule out pulmonary embolus with  recent hip surgery.  That was negative for DVT or PE.  Cardiac enzymes were  negative for cardiac injury.  Patient was placed on Lovenox and Coumadin and  IV Cardizem were increased for better rate control and conversion.   PROBLEM LIST:  Atrial fibrillation.  Cardiology was consulted the evening of  admission.  Dr. Delrae Alfred saw the patient and agreed with echocardiogram.  Patient to be initiated on low-dose Toprol XL.  Echocardiogram was received  the following morning and patient was noted to have converted to sinus  bradycardia around midnight.  Blood pressure was stable and she remained 95%  on room air.  Serial cardiac enzymes remained in  the normal range.  Later in  the day, Dr. Elease Hashimoto rounded on the patient and wrote for discharge orders  following echocardiogram.  Placed her on Toprol XL 25 mg daily to continue  Lipitor 10 mg daily and enteric-coated aspirin 325 mg daily.  Patient was  quite a good historian and had been in atrial fibrillation less than 24  hours.  Echocardiogram did return subsequent to discharge which showed  normal left ventricular size.  Ejection fraction 55-65%, trivial mitral  valvular regurgitation, normal left atrial size, trial pulmonic  regurgitation and trivial tricuspid valvular regurgitation.  Right and left  atrial size were normal.  There were no regional wall motion abnormalities.  The patient was instructed to follow up with Dr. Elease Hashimoto in two weeks for an  ECG in the office.  At time of discharge, TSH was also pending that did  return normal at 3.002.  Patient was discharged thus now in sinus rhythm and  stable.                                                Marcene Duos, M.D.    EMM/MEDQ  D:  10/08/2001  T:  10/11/2001  Job:  [id number]    cc:   Demetria Pore. Coral Spikes, M.D.

## 2010-07-29 NOTE — Discharge Summary (Signed)
[place] . [facility]  Patient:    Natalie Ho, Natalie Ho Visit Number: [id number] MRN: [id number]          Service Type: SUR Location: 5000 5013 01 Attending Physician:  Loanne Drilling Dictated by:   Alexzandrew L. Perkins, P.A.-C. Admit Date:  08/04/2001 Discharge Date: 08/07/2001                             Discharge Summary  ADMISSION DIAGNOSES: 1. Left femoral neck fracture. 2. Hypercholesterolemia.  DISCHARGE DIAGNOSES: 1. Left femoral neck fracture status post pinning of left femoral neck. 2. Hypercholesterolemia.  PROCEDURE:  The patient was taken to the operating room on Aug 04, 2001 and underwent a pinning of the left femoral neck.  SURGEON:  Ollen Gross, M.D.  ANESTHESIA:  General anesthesia.  CONSULTATIONS:  None.  HISTORY OF PRESENT ILLNESS:  The patient is a 77 year old female who slipped on her kitchen floor earlier in the morning of Aug 04, 2001.  She sustained a left hip injury.  The patient had immediate onset of pain and was transferred to [facility] emergency department where x-rays revealed patient sustained a left femoral neck fracture.  The patient was seen and evaluated by Dr. Lequita Halt who felt she would require surgical intervention.  She was subsequently admitted to the hospital.  LABORATORY DATA:  CBC on admission showed hemoglobin 13.7, hematocrit 39.7, white blood cell count 5.1, red cell count 4.44.  Differential was within normal limits with the exception of minimally elevated neutrophils at 78. Postoperative hemoglobin and hematocrit 11.2 and 33.0.  Protime and PTT were 12.2 and 34 respectively with an INR of 0.8.  Serial protimes followed with last noted protime and INR were 17.1 and 1.5.  Chemistry panel on admission was all within normal limits with the exception of minimally elevated glucose of 135.  Electrocardiogram dated Aug 04, 2001 normal sinus rhythm, early repolarization, probably normal variant  confirmed by Dr. Lewayne Bunting.  Chest x-ray taken on Aug 04, 2001 cardiomegaly, no active disease.  HOSPITAL COURSE:  The patient was admitted to [facility], taken to the operating room and underwent the above-stated procedure without complications.  The patient tolerated the procedure well and was later transferred to the recovery room and then to the orthopedic floor for continued postoperative care.  The patient was placed on PCA analgesics for pain control following surgery, received 24 hours of postoperative antibiotics in the form of Ancef.  She was placed on Coumadin protocol for three weeks. She was weaned off her PCA analgesics and her p.o. analgesics by postoperative day #1 and postoperative day #2.  Physical therapy was consulted to assist with gait training ambulation.  She was allowed to be weight-bearing as tolerated.  Dressing was changed on postoperative day #2.  Incision was healing well with no signs of infection.  The patient continued to progress. Discharge planning was consulted to assist with post hospitalization needs. By postoperative day #3 the patient was doing quite well and was ready to go home.  DISCHARGE PLANS:  The patient is discharged home on Aug 07, 2001.  DISCHARGE DIAGNOSES: Please see above.  DISCHARGE MEDICATIONS: 1. Patient was placed on Colace 100 mg b.i.d. 2. Coumadin as per pharmacy protocol. 3. Percocet 5 mg one or two q.4-6h. p.r.n. pain. 4. Robaxin 500 mg p.o. q.6h. p.r.n. spasm.  DIET:  Low cholesterol diet as tolerated.  ACTIVITY:  She is weight-bearing as tolerated  to the left lower extremity. She may be up as tolerated.  She will walk for mobility and therapy.  DISPOSITION:  Home.  FOLLOW UP:  Follow up in two weeks from surgery.  CONDITION ON DISCHARGE:  Improved.Dictated by:   Alexzandrew L. Perkins, P.A.-C. Attending Physician:  Loanne Drilling DD:  09/11/01 TD:  09/13/01 Job: [id number]

## 2010-07-29 NOTE — Discharge Summary (Signed)
NAME:  Natalie Ho, Natalie Ho NO.:  [id number]   MEDICAL RECORD NO.:  [id number]          PATIENT TYPE:  INP   LOCATION:  3731                         FACILITY:  [facility]   PHYSICIAN:  Vesta Mixer, M.D. DATE OF BIRTH:  [birth year]   DATE OF ADMISSION:  05/29/2006  DATE OF DISCHARGE:  05/31/2006                               DISCHARGE SUMMARY   DISCHARGE DIAGNOSES:  1. Transient ischemic attack.  2. Intermittent atrial fibrillation.  3. Dyslipidemia.  4. Hypertension.   DISCHARGE MEDICATIONS:  1. Coumadin 7.5 mg two days a week with 5 mg five days a week.  2. Plavix 75 mg a day.  3. Lipitor 20 mg a day  4. Benicar 20 mg a day.  5. Digoxin 0.25 mg a day.  6. Toprol XL 50 mg a day.  7. Calcium 1200 mg a day.  8. Prilosec 20 mg a day.  9. Aspirin 81 mg a day.   DISPOSITION:  The patient will see Dr. Elease Hashimoto in 2-3 weeks.  She is to  check a protime in our office on Monday.   HISTORY:  [name] is an elderly female with a history of  intermittent atrial fibrillation.  She was admitted with findings  consistent with a TIA.  Please see dictated H&P for further details.   HOSPITAL COURSE BY PROBLEMS:  1. TIA.  The patient was admitted for TIA symptoms.  Her INR was found      to be 1.4.  She was started on heparin and her Coumadin was      increased.  Her INR is now 2.0 and she is ready for discharge.  Her      TIA symptoms have completely cleared and there are no neurologic      deficits.  She will follow up with Dr. Vickey Huger for further      evaluation of her TIA.   The patient had a head CT which was unremarkable.   1. Atrial fibrillation.  The patient has known intermittent atrial      fibrillation.  She has remained in sinus rhythm during this      hospitalization.  We will send her home on the above noted      medications.  We have added an aspirin 81 mg a day.   All of her other medical problems are stable.     ______________________________  Vesta Mixer, M.D.     PJN/MEDQ  D:  05/31/2006  T:  05/31/2006  Job:  [id number]   cc:   Melvyn Novas, M.D.  Vesta Mixer, M.D.

## 2010-07-29 NOTE — H&P (Signed)
[place]. [facility]  Patient:    Natalie Ho, Natalie Ho Visit Number: [id number] MRN: [id number]          Service Type: MED Location: 2000 2009 01 Attending Physician:  Julieanne Manson Dictated by:   Julieanne Manson, M.D. Admit Date:  09/15/2001 Discharge Date: 09/16/2001                           History and Physical  DATE OF BIRTH:  07/11/1930  CHIEF COMPLAINT:  Irregular heartbeat with a rate of 77-144 throughout today.  HISTORY OF PRESENT ILLNESS:  This is a 77 year old female with a history of hypercholesterolemia and left hip pinning six weeks ago following a left hip fracture who presents with an irregular heartbeat with a rate ranging from 77-144 since 6 a.m. today.  She has no history of shortness of breath, chest pain, or lightheadedness associated with this.  She possibly has a bit of fatigue and anxiousness.  Similar episode suffered one year ago lasting around two years that resolved on its own.  She did not seek medical attention at that time.  She has no history of thyroid or heart disease.  No history of diabetes mellitus.  Hip pinning six weeks ago.  Was on Coumadin for two weeks postoperatively. Has had some cramping in left thigh since.  No swelling or erythema of lower extremity.  PAST MEDICAL HISTORY: 1. Hyperlipidemia. 2. Left hip fracture.  PAST SURGICAL HISTORY:  Left hip pinning x3.  Pinned six weeks ago.  MEDICATIONS: 1. Aspirin 325 mg p.o. q.d. 2. Calcium. 3. Vitamins. 4. Omega-3. 5. Fish oil. 6. Folate 800 mcg p.o. q.d.  ALLERGIES:  No known drug allergies.  REVIEW OF SYSTEMS:  Flexible sigmoidoscopy two years ago within normal limits. She has a history of hemorrhoids.  No history of a GI bleed.  SOCIAL HISTORY:  Married for 49 years.  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  She is currently babysitting for her grandson.  FAMILY HISTORY:  Father died age 74 heart  disease, question of embolism, unknown type.  Mother died age 58 MI.  She had diabetes and was obese.  Five sisters all in their 71s-80s, essentially healthy.  Two brothers died age 42 of alcohol related problems and heart disease and another at 20 of same.  One brother age 4 with a CABG x3 recently.  Two children ages 48 and 57 healthy.  PHYSICAL EXAMINATION  VITAL SIGNS:  Temperature 97.5, blood pressure 122/81, heart rate 90s-120 on 5 mg Cardizem IV, respiratory rate 20, SAO2 99% on room air.  EKG shows atrial fibrillation.  No definite ischemic changes.  Occasional PVC versus an eberrantly conducted complex.  Electrolytes within normal limits save for mildly elevated glucose at 123, hemoglobin 14.3.  HEENT:  Pupils are equal, round, and reactive to light.  Extraocular movements are intact.  Disks are sharp.  Tympanic membranes are clear.  Throat is without injection.  NECK:  Supple without adenopathy.  No thyromegaly.  CHEST:  Clear.  CARDIOVASCULAR:  Irregularly irregular rhythm with no S3, murmur, or rub.  No carotid bruits.  Carotid radial, femoral, DP, PT pulses normodynamic and equal.  No JVD.  ABDOMEN:  Soft, nontender.  No organomegaly or masses appreciated.  Bowel sounds present throughout.  EXTREMITIES:  No edema.  Nontender to palpation.  RECTAL:  External hemorrhoids.  Heme-negative light brown stool.  NEUROLOGIC:  Alert and oriented x3.  Cranial nerves II-XII grossly intact.  ASSESSMENT AND PLAN:  New onset atrial fibrillation.  Check echocardiogram. Check CT scan of chest and thighs.  Rule out pulmonary embolism, deep venous thrombosis with recent orthopedic history.  Rule out myocardial infarction, unlikely.  TSH pending.  Will anticoagulate with Lovenox and Coumadin. Intravenous Cardizem for rate control and hopefully conversion.  Dr. Elease Hashimoto has been consulted also in her evaluation. Dictated by:   Julieanne Manson, M.D. Attending Physician:  Julieanne Manson DD:  09/15/01 TD:  09/17/01 Job: [id number]

## 2010-07-29 NOTE — Op Note (Signed)
NAME:  Natalie Ho, Natalie Ho NO.:  [id number]   MEDICAL RECORD NO.:  [id number]                   PATIENT TYPE:  AMB   LOCATION:  ENDO                                 FACILITY:  [facility]   PHYSICIAN:  Danise Edge, M.D.                DATE OF BIRTH:  1930-10-24   DATE OF PROCEDURE:  04/27/2003  DATE OF DISCHARGE:                                 OPERATIVE REPORT   REFERRING PHYSICIAN:  Dr. Lennox Pippins   PROCEDURE:  Diagnostic colonoscopy.   PROCEDURE INDICATION:  [name] is a 77 year old female, born  April 30, 1930.  Diagnostic colonoscopy is scheduled to evaluate painless  hematochezia.   ENDOSCOPIST:  Charolett Bumpers, M.D.   PREMEDICATION:  1. Versed 5 mg.  2. Demerol 50 mg.   DESCRIPTION OF PROCEDURE:  After obtaining informed consent, [name]  was placed in the left lateral decubitus position.  I administered  intravenous Demerol and intravenous Versed to achieve conscious sedation for  the procedure.  The patient's blood pressure, oxygen saturation, and cardiac  rhythm were monitored throughout the procedure and documented in the medical  record.   Anal inspection revealed a small, nonbleeding, thrombosed external  hemorrhoid.  Digital rectal exam was normal.  The Olympus adjustable  pediatric colonoscope was introduced into the rectum and advanced to the  cecum.  A normal-appearing ileocecal valve was intubated and the distal  ileum inspected.  Colonic preparation for the exam today was excellent.   RECTUM:  From the mid-distal rectum, three 1 mm sessile polyps were removed  with the cold biopsy forceps.  SIGMOID COLON AND DESCENDING COLON:  Normal.  SPLENIC FLEXURE:  Normal.  TRANSVERSE COLON:  Normal.  HEPATIC FLEXURE:  Normal.  ASCENDING COLON:  From the proximal ascending colon, a 1 mm sessile polyp  was removed with the cold biopsy forceps.  CECUM AND ILEOCECAL VALVE:  Normal.  DISTAL ILEUM:  Normal.   ASSESSMENT:  1. No endoscopic evidence for the presence of colon cancer.  2. Small thrombosed external hemorrhoid.  3. A diminutive polyp was removed from the ascending colon, and three     diminutive polyps were removed from the rectum.  All polyps were     submitted in one bottle for pathological evaluation.   RECOMMENDATIONS:  If polyps return neoplastic pathologically, [name]  is to undergo a repeat colonoscopy in 5 years.                                               Danise Edge, M.D.    MJ/MEDQ  D:  04/27/2003  T:  04/27/2003  Job:  [id number]   cc:   Demetria Pore. Coral Spikes, M.D.  [address]  [address]  [place]  [place] [address]  Fax: [phone]

## 2010-07-29 NOTE — Consult Note (Signed)
NAME:  Natalie Ho, Natalie Ho NO.:  [id number]   MEDICAL RECORD NO.:  [id number]          PATIENT TYPE:  EMS   LOCATION:  MAJO                         FACILITY:  [facility]   PHYSICIAN:  Melvyn Novas, M.D.  DATE OF BIRTH:  10/12/1930   DATE OF CONSULTATION:  05/29/2006  DATE OF DISCHARGE:                                 CONSULTATION   Patient of Dr. Kristeen Miss, seen at the 17 hours on May 29, 2006, at  the [facility]. [facility] Emergency Room, room five.   The patient is a pleasant 77 year old right-handed retired, married  Caucasian female who suffered, this morning, a spell of palpitation that  she says she frequently encounters between 3:00 and 6:00 a.m.  This  irregular heart rate is known to her and she relates it to her known  paroxysmal condition of atrial fibrillation.  She went to have an INR  drawn recently at her  cardiology office which returned this morning at  1.4 and she was asked to increase her Coumadin dose.  She felt fine for  the rest of the day until about a 4:30 when she had sudden onset of  blurred vision and a decreased level of consciousness.  It is not clear  to me if the patient actually passed out.  He felt dizzy, more in the  form of lightheadedness, there was no vertigo.  She denies any nausea or  fever.  She did not have any lateralized symptoms.   The ER called a code stroke but the NIH stroke scale at the time of  arrival already 0.  The patient mentioned to me a cardiologist, Dr.  Elease Hashimoto, to have him informed of her admission.  I called his partner  today, Dr. Reyes Ivan.   PHYSICAL EXAMINATION:  VITAL SIGNS:  The patient is currently in 94% O2  on room air.  She is in normal sinus rhythm at this point with 78-82  beats per minute and a blood pressure of 113/78.  GENERAL APPEARANCE:  Alert and oriented x3 with fluent and clear speech.  LUNGS:  Clear to auscultation.  COR:  Regular rate and rhythm.  No murmur.  No bruit.  HEENT:  Mallampati grade B.  NEUROLOGIC:  The patient has full extraocular movements and pupils react  equal to light.  She has full visual fields.  No papilledema.  Facial  symmetry is preserved.  Facial sensory is preserved.  Tongue and uvula  are in midline.  She has equal deep tendon reflexes at 2+ in the upper  and 1+ in the lower extremities.  There is no Babinski response.  No  sensory loss to touch, temperature, pinprick and vibration.   SOCIAL HISTORY:  She is married, retired.  She denies having been  exposed to any toxic substances in the past.  A nondrinker, nonsmoker.   FAMILY HISTORY:  Not available and I could, do to time constraints, not  returned to ask additional questions.   The patient had told me that she believes she had an onset of slurred  speech at the same time her blurring  of vision occurred. She dated the  symptoms at about 16 hours.   ASSESSMENT:  This patient suffered a possible transient ischemic attack  which has completely resolved at the time of evaluation.  The CT scan  was negative for bleed.  An EKG shows no atrial fibrillation at this  time.   PLAN:  The patient will be admitted for IV heparin to bridge over until  her Coumadin will resume an INR between 2 and 3.  Cardiologist, Dr.  Reyes Ivan on call, was so kind as to admit this patient to his service.      Melvyn Novas, M.D.  Electronically Signed     CD/MEDQ  D:  05/29/2006  T:  05/30/2006  Job:  [id number]

## 2010-07-29 NOTE — H&P (Signed)
NAME:  Natalie Ho, Natalie Ho NO.:  [id number]   MEDICAL RECORD NO.:  [id number]          PATIENT TYPE:  EMS   LOCATION:  MAJO                         FACILITY:  [facility]   PHYSICIAN:  Elmore Guise., M.D.DATE OF BIRTH:  12-29-1930   DATE OF ADMISSION:  05/29/2006  DATE OF DISCHARGE:                              HISTORY & PHYSICAL   INDICATION FOR ADMISSION:  1. Palpitations.  2. History of paroxysmal atrial fibrillation with possible transient      ischemic attack.   HISTORY OF PRESENT ILLNESS:  Natalie Ho is a very pleasant 77-year-  old white female with past medical history of paroxysmal atrial  fibrillation, hypertension, dyslipidemia who presents for evaluation of  headache, blurry vision and palpitations.  The patient has been followed  by Dr. Kristeen Miss for her symptoms.  She reports 9 episodes of  palpitations over the last 6 months.  They will last anywhere from 3-10  hours.  Today she went to the office, had blood work done, was  complaining of palpitations with her heart racing and skipping.  Her  symptoms started this morning approximately 3 a.m. and lasted until 10  a.m..  Her INR was subtherapeutic with a level 1.4 and she was told to  increase her Coumadin dose.  She then went about her normal activities,  started having a headache approximately 3 p.m.  This continued with  significant blurry vision, both resolved within 5 minutes.  She is now  referred for hospital admission.  The patient did request cardiology  evaluation and treatment.   CURRENT MEDICATIONS:  1. Prilosec.  2. Benicar.  3. Lipitor.  4. Toprol.  5. Digoxin.  6. Coumadin.  7. Plavix.   ALLERGIES:  None.   FAMILY HISTORY:  Positive for heart disease and stroke.   SOCIAL HISTORY:  She is married.  No tobacco, does have occasional  alcoholic beverage, primarily wine.   EXAM:  VITAL SIGNS:  She is satting 94% on room air,  blood pressure  113/78, heart rate 82 per  minute in normal sinus rhythm. GENERAL:  She  is a very pleasant, elderly white female, alert and oriented x4.  No  acute distress.  NECK:  She has no JVD and no bruits.  LUNGS:  Clear.  HEART:  Regular with no significant murmurs, gallops or rubs.  ABDOMEN:  Soft, nontender, nondistended.  EXTREMITIES:  Warm with 2+ pulses.  No significant edema.  NEUROLOGIC:  Cranial nerves are intact and strength is 5/5 and equal  bilaterally.   Her CT of the head shows no acute abnormalities with old right frontal  stroke.   Her lab work shows a hemoglobin of 14.5, white count of 5.5, platelet  count 317.  Her INR in the office 1.4.  ECG shows sinus bradycardia rate  of 53 per minute with possible old anterior infarct . No significant ST  or T-wave changes noted.   IMPRESSION:  1. Paroxysmal atrial fibrillation now in sinus rhythm with      subtherapeutic INR.  2. Possible transient ischemic attack.  3. Hypertension.  4. Dyslipidemia.  PLAN:  1. The patient will be admitted for telemetry monitoring.  She will      have an echocardiogram performed.  We will also continue her      Toprol, aspirin, Lipitor, increase her Coumadin 7.5 mg daily.  She      will continue with heparin until INR is therapeutic.  Neurology has      already been consulted and has been in for evaluation.  All the      patient's questions were answered.  She does request further      evaluation by Dr. Kristeen Miss who will be available in the      morning.      Elmore Guise., M.D.  Electronically Signed     TWK/MEDQ  D:  05/29/2006  T:  05/30/2006  Job:  [id number]   cc:   Vesta Mixer, M.D.

## 2010-07-29 NOTE — Op Note (Signed)
NAME:  Natalie Ho, KERNODLE NO.:  [id number]   MEDICAL RECORD NO.:  [id number]                   PATIENT TYPE:  AMB   LOCATION:  DAY                                  FACILITY:  [facility]   PHYSICIAN:  Ollen Gross, M.D.                 DATE OF BIRTH:  [birth year]   DATE OF PROCEDURE:  01/28/2003  DATE OF DISCHARGE:                                 OPERATIVE REPORT   PREOPERATIVE DIAGNOSIS:  Painful hardware, left hip.   POSTOPERATIVE DIAGNOSIS:  Painful hardware, left hip.   PROCEDURE:  Hardware removal, left hip.   SURGEON:  Ollen Gross, M.D.   ANESTHESIA:  General.   ESTIMATED BLOOD LOSS:  Minimal.   DRAINS:  None.   COMPLICATIONS:  None.   CONDITION:  Stable to recovery.   BRIEF CLINICAL NOTE:  [name] is a 77 year old female who had an in-  situ pinning of a left femoral neck fracture over a year ago.  She has had  pain with respect to the hardware.  The fracture healed uneventfully.  She  presents now for hardware removal.   PROCEDURE IN DETAIL:  After the successful administration of a general  anesthetic, the patient is placed in the right lateral decubitus position  with the left side up and held with the hip positioner.  The left lower  extremity was isolated from the perineum with a plastic drape, was then  prepped and draped in the usual sterile fashion.  Previous incisions were  utilized, skin cut with a 10 blade through the subcutaneous tissue to the  level of the fascia lata, which is incised in line with the skin incision.  Screw heads have been palpated and the screws are removed with the Ace  cannulated screwdriver.  There were three screws overall.  I then placed her  hip through a range of motion and everything moved as one solitary unit.  The wound was then copiously irrigated with antibiotic solution and the  fascia lata closed with interrupted #1 Vicryl, the subcu closed with  interrupted 2-0 Vicryl, and subcuticular  running 4-0 Monocryl.  The incision  was cleaned and dried and Steri-Strips and a bulky sterile dressing applied.  She was then awakened and transported to recovery in stable condition.                                               Ollen Gross, M.D.    FA/MEDQ  D:  01/28/2003  T:  01/28/2003  Job:  [id number]

## 2010-07-29 NOTE — Discharge Summary (Signed)
   NAME:  Natalie Ho, Natalie Ho NO.:  [id number]   MEDICAL RECORD NO.:  [id number]                   PATIENT TYPE:  INP   LOCATION:  4740                                 FACILITY:  [facility]   PHYSICIAN:  Vesta Mixer, M.D.              DATE OF BIRTH:  [birth year]   DATE OF ADMISSION:  10/16/2001  DATE OF DISCHARGE:  10/17/2001                                 DISCHARGE SUMMARY   DISCHARGE DIAGNOSIS:  Atrial fibrillation, now improved.   DISCHARGE MEDICATIONS:  1. Plavix 75 mg a day.  2. Enteric-coated aspirin 325 mg a day.  3. Toprol XL 25 mg a day.  4. Propranolol 10 mg 4 times a day as needed.  5. Digoxin 0.25 mg a day.   DISPOSITION:  The patient will return to see Dr. Elease Hashimoto in approximately one  week.   HISTORY:  [name] is a 77 year old female with a history of  intermittent atrial fibrillation.  She also has recent hip surgery.  She was  admitted with recurrent episodes of atrial fibrillation.   HOSPITAL COURSE:  1 - ATRIAL FIBRILLATION:  The patient was started on a  Cardizem drip in the hospital.  She did not have any episodes of chest pain  or shortness of breath.  She converted to normal sinus rhythm during the  night.  She did have some sinus bradycardia, and the Cardizem drip was  discontinued.  The patient's TSH was found to be normal.  The patient will  be discharged on the above-noted medications and disposition.  We had a long  discussion regarding Coumadin.  The patient does not want to start Coumadin  at this point.  Since her episodes of atrial fibrillation are fairly short  lived, I do not think that we need to start Coumadin at this point.  We will  continue with the aspirin, and we will add Plavix 75 mg a day.  We have also  added digoxin which is a new medicine for her.  I will see her again in one  week in the office.                                               Vesta Mixer, M.D.    PJN/MEDQ  D:  10/17/2001  T:   10/22/2001  Job:  [id number]   cc:   Demetria Pore. Coral Spikes, M.D.

## 2010-07-29 NOTE — Op Note (Signed)
NAME:  Natalie Ho, Natalie Ho NO.:  [id number]   MEDICAL RECORD NO.:  [id number]          PATIENT TYPE:  AMB   LOCATION:  CARD                         FACILITY:  [facility]   PHYSICIAN:  Casimiro Needle B. Sherene Sires, M.D. [facility] OF BIRTH:  05-16-1930   DATE OF PROCEDURE:  06/02/2004  DATE OF DISCHARGE:                                 OPERATIVE REPORT   PROCEDURE:  Fiberoptic bronchoscopy, diagnostic with lavage of the lingula.   HISTORY AND INDICATIONS:  Please see dictated office notes.  This is a 77-  year-old white female with a chronic cough with sputum production each  morning, variable in terms of quantity and quality.  I recommended before  any additional empiric therapy, obtaining lavage looking for any evidence of  eosinophilic bronchitis or __________ organisms such as MAI, and she agreed  to the procedure after a full discussion of risks, benefits, and  alternatives in the office.   The procedure was performed in the bronchoscopy suite with continuous  monitoring by surface ECG and oximetry.  The patient maintained adequate  saturation throughout the procedure in sinus rhythm.  She received a total  of 25 mg of Demerol IV and 5 mg IV Versed for adequate sedation and cough  suppression.   Using a standard flexible fiberoptic bronchoscope, the right naris was  easily cannulated with good visualization of the entire oropharynx and  larynx.  The cords moved normally, and there were no apparent upper airway  lesions.   Using additional 1% lidocaine as needed, the entire tracheobronchial tree  was explored bilaterally with the following findings.   The trachea, carina, and all of the major airways were opened widely to the  subsegmental area.  There were only minimal increased secretions, more on  the left side than the right side, more on the upper lobe than the lower  lobe.  These secretions were mucoid and not particularly thick.   The underlying mucosa was normal.   I  therefore elected to perform a lavage to the level of the lingula with  adequate return and sent this for cell count and differential, Gram stain  and culture, AFB and fungal stain and culture as well as cytology.   Follow up chest x-ray is pending.   The patient tolerated the procedure well.   IMPRESSION:  Chronic cough with minimal increased secretions and/or evidence  of airway inflammation based on bronchoscopy.   RECOMMENDATIONS:  Await BAL studies before any further empiric therapy.  Continue in the meantime treatment directed at suppressing reflux  vigorously.      MBW/MEDQ  D:  06/02/2004  T:  06/02/2004  Job:  [id number]   cc:   Demetria Pore. Coral Spikes, M.D.  [address]  [address]  [place]  [place] [address]  Fax: [phone]

## 2010-07-29 NOTE — Op Note (Signed)
[place]  Patient:    MAZELLA, DEEN Visit Number: [id number] MRN: [id number]          Service Type: SUR Location: [id number] Attending Physician:  Loanne Drilling Dictated by:   Ollen Gross, M.D. Proc. Date: 08/04/01 Admit Date:  08/04/2001                             Operative Report  PREOPERATIVE DIAGNOSIS:  Left femoral neck fracture.  POSTOPERATIVE DIAGNOSIS:  Left femoral neck fracture.  PROCEDURE:  Pinning of left femoral neck fracture.  SURGEON:  Ollen Gross, M.D.  ANESTHESIA:  General.  ESTIMATED BLOOD LOSS:  Minimal.  DRAINS:  None.  COMPLICATIONS:  None.  CONDITION:  Stable to recovery.  CLINICAL NOTE:  Ms. Creed is a 77 year old female who had a fall in her kitchen earlier this morning, sustaining a valgus-impacted left femoral neck fracture.  She presents now for pinning of the fracture.  DESCRIPTION OF PROCEDURE:  After the successful administration of general anesthetic, the patient was placed on the [facility] fracture table with her left foot well-padded in a traction boot and right leg well-padded in a well-leg holder.  The left lower extremity was then prepped and draped in the usual sterile fashion after fluoroscopy confirmed that the fracture had not displaced.  She had a valgus-impacted position.  Once she was prepped and draped, I put a guide pin over the thigh to mark my incision.  Skin incision was made approximately 1-1/2 inches in length.  Cut down through the skin and subcutaneous tissues to the fascia lata, then the fascia of the vastus lateralis was incised and the muscle fibers split to get to the lateral cortex of the femur.  First guide pin is placed.  We placed the guide pin so it would be at a 90 degree angle to the fracture; thus, it was in the center of the femoral neck but the inferior portion of the femoral head given the valgus position.  The first pin is placed and found to be in  excellent position on AP and then central positioned in the lateral.  We then placed another pin superior to this and another one anterior to this.  I was very pleased at the positions of the pins, and we placed appropriate-length screws of 100, 100, and 95 mm, respectively, utilizing 6.5 mm Ace cannulated screws.  Guide pins were then removed.  Spot fluoro film was taken AP and lateral showing the hardware to be in good position with maintenance of the fracture reduction. The wound was then copiously irrigated with antibiotic solution and the fascia lata closed with interrupted #1 Vicryl, subcu with interrupted 2-0 Vicryl, subcuticular in 4-0 Monocryl.  The incision cleaned, dried, and Steri-Strips and bulky sterile dressing applied.  The patient was awakened and transported to the recovery room in stable condition. Dictated by:   Ollen Gross, M.D. Attending Physician:  Loanne Drilling DD:  08/04/01 TD:  08/06/01 Job: [id number]

## 2010-08-05 ENCOUNTER — Other Ambulatory Visit: Payer: Self-pay | Admitting: Cardiovascular Disease

## 2010-08-05 NOTE — Telephone Encounter (Signed)
Fax received from pharmacy. Refill completed. Jodette Lyon Dumont RN  

## 2010-08-09 ENCOUNTER — Other Ambulatory Visit: Payer: Self-pay | Admitting: Obstetrics & Gynecology

## 2010-08-24 ENCOUNTER — Encounter: Payer: Self-pay | Admitting: Cardiovascular Disease

## 2010-08-24 ENCOUNTER — Ambulatory Visit (INDEPENDENT_AMBULATORY_CARE_PROVIDER_SITE_OTHER)
Admission: RE | Admit: 2010-08-24 | Discharge: 2010-08-24 | Disposition: A | Discharge status: Home / Self Care | Payer: Medicare Other | Source: Ambulatory Visit | Attending: Pulmonary Disease | Admitting: Pulmonary Disease

## 2010-08-24 ENCOUNTER — Ambulatory Visit (INDEPENDENT_AMBULATORY_CARE_PROVIDER_SITE_OTHER): Payer: Medicare Other | Admitting: Pulmonary Disease

## 2010-08-24 ENCOUNTER — Encounter: Payer: Self-pay | Admitting: Pulmonary Disease

## 2010-08-24 DIAGNOSIS — R05 Cough: Secondary | ICD-10-CM

## 2010-08-24 DIAGNOSIS — R059 Cough, unspecified: Secondary | ICD-10-CM

## 2010-08-24 DIAGNOSIS — R042 Hemoptysis: Secondary | ICD-10-CM

## 2010-08-24 NOTE — Patient Instructions (Signed)
Cont current medications and therapies as prescribed. Follow up with Dr Delton Coombes as needed if cough persists

## 2010-08-26 ENCOUNTER — Other Ambulatory Visit: Payer: Self-pay | Admitting: *Deleted

## 2010-08-26 DIAGNOSIS — E785 Hyperlipidemia, unspecified: Secondary | ICD-10-CM

## 2010-08-26 NOTE — Progress Notes (Signed)
  Subjective:    Patient ID: Natalie Ho, female    DOB: 05/27/1930, 77 y.o.   MRN: [id number]  HPI Pt of Dr Delton Coombes for chronic cough and PNDS. Has also been evaluated for a RUL nodule in past which is now deemed to be benign. Seen as add-on 6/13 with persistent to worsening cough, particularly in the past couple of weeks. Had 2 episodes of scant hemoptysis on 6/12. No increase in SOB. Denies CP or pleurodynia. Denies F/C/S, LE edema or calf tenderness. On warfarin for CAF. PT last checked approx 3 wks ago and has been in therapeutic range consistently. Denies overt epistaxis but thinks blood might have come from nose    Review of Systems  Constitutional: Negative.   HENT: Positive for congestion and postnasal drip. Negative for ear pain, nosebleeds, rhinorrhea, sneezing and ear discharge.   Eyes: Negative.   Respiratory: Negative.   Cardiovascular: Negative.   Gastrointestinal: Negative.   Hematological: Bruises/bleeds easily.       Objective:   Physical Exam  Constitutional: She is oriented to person, place, and time. She appears well-developed and well-nourished. No distress.  HENT:  Head: Normocephalic and atraumatic.  Right Ear: External ear normal.  Left Ear: External ear normal.  Nose: Nose normal.  Mouth/Throat: Oropharynx is clear and moist.  Eyes: Conjunctivae and EOM are normal. Pupils are equal, round, and reactive to light.  Neck: Neck supple. No JVD present. No thyromegaly present.  Cardiovascular: Normal rate.  Exam reveals no gallop.   No murmur heard. Pulmonary/Chest: Effort normal and breath sounds normal.  Abdominal: Soft. Bowel sounds are normal.  Musculoskeletal: She exhibits no edema.  Lymphadenopathy:    She has no cervical adenopathy.  Neurological: She is alert and oriented to person, place, and time.  Skin: Skin is warm and dry.  Psychiatric: She has a normal mood and affect.      CXR:  NSC    Assessment & Plan:  1) Chronic cough - perhaps  worse in past couple of weeks. Refractory to prior attempts @ therapy 2) RUL nodule - no change. Benign 3) Scant hemoptysis - likely due to anticoagulation and vigorous coughing. I have offered reassurance and no specific therapies. She will have her PT-INR rechecked next week as scheduled. She will followup with Dr Delton Coombes

## 2010-08-29 ENCOUNTER — Ambulatory Visit (INDEPENDENT_AMBULATORY_CARE_PROVIDER_SITE_OTHER): Payer: Medicare Other | Admitting: Cardiovascular Disease

## 2010-08-29 ENCOUNTER — Other Ambulatory Visit (INDEPENDENT_AMBULATORY_CARE_PROVIDER_SITE_OTHER): Payer: Medicare Other | Admitting: *Deleted

## 2010-08-29 ENCOUNTER — Encounter: Payer: Self-pay | Admitting: Cardiovascular Disease

## 2010-08-29 VITALS — BP 158/78 | HR 56 | Ht 66.5 in | Wt 138.6 lb

## 2010-08-29 DIAGNOSIS — E7800 Pure hypercholesterolemia, unspecified: Secondary | ICD-10-CM

## 2010-08-29 DIAGNOSIS — I10 Essential (primary) hypertension: Secondary | ICD-10-CM

## 2010-08-29 LAB — BASIC METABOLIC PANEL
CO2: 30 mEq/L (ref 19–32)
Chloride: 94 mEq/L — ABNORMAL LOW (ref 96–112)
Glucose, Bld: 101 mg/dL — ABNORMAL HIGH (ref 70–99)
Potassium: 4.4 mEq/L (ref 3.5–5.1)
Sodium: 135 mEq/L (ref 135–145)

## 2010-08-29 LAB — HEPATIC FUNCTION PANEL
ALT: 27 U/L (ref 0–35)
AST: 27 U/L (ref 0–37)
Albumin: 4.1 g/dL (ref 3.5–5.2)
Alkaline Phosphatase: 75 U/L (ref 39–117)
Bilirubin, Direct: 0.2 mg/dL (ref 0.0–0.3)
Total Protein: 6.6 g/dL (ref 6.0–8.3)

## 2010-08-29 LAB — LIPID PANEL
HDL: 45.2 mg/dL (ref >39.00)
Total CHOL/HDL Ratio: 3

## 2010-08-29 LAB — POCT INR: INR: 3.1

## 2010-08-29 MED ORDER — AMLODIPINE BESYLATE 2.5 MG PO TABS: 2.5000 mg | ORAL_TABLET | Freq: Every day | ORAL | Status: DC

## 2010-08-29 MED ORDER — NEBIVOLOL HCL 10 MG PO TABS: 10.0000 mg | ORAL_TABLET | Freq: Every day | ORAL | Status: DC

## 2010-08-29 NOTE — Patient Instructions (Signed)
Try taking Amlodipine at night instead of morning.

## 2010-08-29 NOTE — Progress Notes (Signed)
[name] Date of Birth  1930/10/12 [facility] [address]     [address] [place], [place]  [address] [phone]  Fax  [phone]  History of Present Illness:  Continues to have problems with HTN.  BP is normal during the day and then goes up at night.  160 range.  BP is normal at other times.  Active but not exercising much due to arthritis.  Current Outpatient Prescriptions on File Prior to Visit  Medication Sig Dispense Refill  . amLODipine (NORVASC) 5 MG tablet Take 2.5 mg by mouth daily.       Marland Kitchen aspirin 81 MG tablet Take 81 mg by mouth daily.        Marland Kitchen atorvastatin (LIPITOR) 20 MG tablet Take 20 mg by mouth daily.        . Cholecalciferol (VITAMIN D) 1000 UNITS capsule Take 1,000 Units by mouth every other day.       . clopidogrel (PLAVIX) 75 MG tablet Take 75 mg by mouth daily.        . digoxin (LANOXIN) 0.25 MG tablet take 1 tablet by mouth once daily  90 tablet  3  . nebivolol (BYSTOLIC) 5 MG tablet Take 10 mg by mouth daily.       Marland Kitchen olmesartan (BENICAR) 20 MG tablet Take 20 mg by mouth 2 (two) times daily.       Marland Kitchen omeprazole (PRILOSEC) 20 MG capsule Take 20 mg by mouth daily.        . sertraline (ZOLOFT) 50 MG tablet Take 50 mg by mouth daily.        Marland Kitchen warfarin (COUMADIN) 5 MG tablet take as directed  50 tablet  3      Allergies  Allergen Reactions  . Alendronate Sodium   . Levofloxacin   . Sulfonamide Derivatives   . Tramadol     Past Medical History  Diagnosis Date  . Atrial fibrillation   . Stroke   . Aortic insufficiency   . Hypertension   . Hyperlipidemia   . SOB (shortness of breath)   . Headache   . Gout   . GERD (gastroesophageal reflux disease)   . Colitis   . Melanoma of nose   . Arthritis   . Anxiety   . Chronic anticoagulation     No past surgical history on file.  History  Smoking status  . Former Smoker -- 0.5 packs/day for 25 years  . Types: Cigarettes  . Quit date: 03/13/1990    Smokeless tobacco  . Not on file    History  Alcohol Use  . 2.5 oz/week  . 5 drink(s) per week    No family history on file.  Reviw of Systems:  Reviewed in the HPI.  All other systems are negative.  Physical Exam: BP 158/78  Pulse 56  Ht 5' 6.5" (1.689 m)  Wt 138 lb 9.6 oz (62.869 kg)  BMI 22.04 kg/m2 The patient is alert and oriented x 3.  The mood and affect are normal.  The skin is warm and dry.  Color is normal.  The HEENT exam reveals that the sclera are nonicteric.  The mucous membranes are moist.  The carotids are 2+ without bruits.  There is no thyromegaly.  There is no JVD.  The lungs are clear.  The chest wall is non tender.  The heart exam reveals a regular rate with a normal S1 and S2.  There are no murmurs,  gallops, or rubs.  The PMI is not displaced.   Abdominal exam reveals good bowel sounds.  There is no guarding or rebound.  There is no hepatosplenomegaly or tenderness.  There are no masses.  Exam of the legs reveal no clubbing, cyanosis, or edema.  The legs are without rashes.  The distal pulses are intact.  Cranial nerves II - XII are intact.  Motor and sensory functions are intact.  The gait is normal.  ECG:  Assessment / Plan:

## 2010-08-29 NOTE — Assessment & Plan Note (Signed)
Natalie Ho's blood pressure continues to be mildly elevated. She states that it is normal during many parts of the day but it is always elevated in the morning. We'll have her try taking her amlodipine at night instead of in the morning. I've written her a new prescription for 2.5 mg tablets to take at night. She can also try taking the Bystolic at night instead of in the morning if needed.  We've had her on higher doses of amlodipine and Bystolic but she developed symptoms of orthostatic hypotension. As a result we have cut her dose down some which is caused her some episodes of hypertension. We may have to settle in occasional episodes of hypertension in order to avoid her orthostatic hypotension.

## 2010-08-30 ENCOUNTER — Ambulatory Visit (INDEPENDENT_AMBULATORY_CARE_PROVIDER_SITE_OTHER): Payer: Medicare Other | Admitting: *Deleted

## 2010-08-30 DIAGNOSIS — I4891 Unspecified atrial fibrillation: Secondary | ICD-10-CM

## 2010-08-31 NOTE — Progress Notes (Signed)
Pt called with labs  

## 2010-09-26 ENCOUNTER — Ambulatory Visit (INDEPENDENT_AMBULATORY_CARE_PROVIDER_SITE_OTHER): Payer: Medicare Other | Admitting: *Deleted

## 2010-09-26 DIAGNOSIS — I4891 Unspecified atrial fibrillation: Secondary | ICD-10-CM

## 2010-09-26 LAB — POCT INR: INR: 2

## 2010-10-24 ENCOUNTER — Ambulatory Visit (INDEPENDENT_AMBULATORY_CARE_PROVIDER_SITE_OTHER): Payer: Medicare Other | Admitting: *Deleted

## 2010-10-24 DIAGNOSIS — I4891 Unspecified atrial fibrillation: Secondary | ICD-10-CM

## 2010-10-24 LAB — POCT INR: INR: 2.4

## 2010-11-08 ENCOUNTER — Other Ambulatory Visit: Payer: Self-pay | Admitting: Cardiovascular Disease

## 2010-11-21 ENCOUNTER — Encounter: Payer: Medicare Other | Admitting: *Deleted

## 2010-11-28 ENCOUNTER — Ambulatory Visit (INDEPENDENT_AMBULATORY_CARE_PROVIDER_SITE_OTHER): Payer: Medicare Other | Admitting: *Deleted

## 2010-11-28 DIAGNOSIS — I4891 Unspecified atrial fibrillation: Secondary | ICD-10-CM

## 2010-11-28 LAB — POCT INR: INR: 2.5

## 2010-12-01 LAB — CBC
HCT: 46.2 — ABNORMAL HIGH
Hemoglobin: 15.8 — ABNORMAL HIGH
MCHC: 34.3
MCV: 90.3
Platelets: 281
RBC: 5.12 — ABNORMAL HIGH
RDW: 12.8
WBC: 4.4

## 2010-12-01 LAB — URINALYSIS, ROUTINE W REFLEX MICROSCOPIC
Bilirubin Urine: NEGATIVE
Glucose, UA: NEGATIVE
Hgb urine dipstick: NEGATIVE
Ketones, ur: NEGATIVE
Leukocytes, UA: NEGATIVE
Nitrite: NEGATIVE
Protein, ur: 30 — AB
Specific Gravity, Urine: 1.016
Urobilinogen, UA: 0.2
pH: 8

## 2010-12-01 LAB — POCT CARDIAC MARKERS
CKMB, poc: 2.4
Myoglobin, poc: 87.7
Operator id: 198171
Troponin i, poc: <0.05

## 2010-12-01 LAB — DIFFERENTIAL
Basophils Absolute: 0.1
Basophils Relative: 2 — ABNORMAL HIGH
Eosinophils Absolute: 0.1
Eosinophils Relative: 3
Lymphocytes Relative: 27
Lymphs Abs: 1.2
Monocytes Absolute: 0.5
Monocytes Relative: 11
Neutro Abs: 2.5
Neutrophils Relative %: 58

## 2010-12-01 LAB — BASIC METABOLIC PANEL WITH GFR
BUN: 10
CO2: 29
Chloride: 98
Creatinine, Ser: 0.76
Glucose, Bld: 126 — ABNORMAL HIGH

## 2010-12-01 LAB — URINE MICROSCOPIC-ADD ON

## 2010-12-01 LAB — BASIC METABOLIC PANEL
Calcium: 9.5
GFR calc Af Amer: >60
GFR calc non Af Amer: >60
Potassium: 4.2
Sodium: 135

## 2010-12-01 LAB — PROTIME-INR
INR: 2.3 — ABNORMAL HIGH
Prothrombin Time: 25.9 — ABNORMAL HIGH

## 2010-12-01 LAB — APTT: aPTT: 48 — ABNORMAL HIGH

## 2010-12-08 ENCOUNTER — Encounter: Payer: Self-pay | Admitting: Cardiovascular Disease

## 2010-12-08 ENCOUNTER — Ambulatory Visit (INDEPENDENT_AMBULATORY_CARE_PROVIDER_SITE_OTHER): Payer: Medicare Other | Admitting: Cardiovascular Disease

## 2010-12-08 VITALS — BP 122/68 | HR 52 | Ht 67.0 in | Wt 141.6 lb

## 2010-12-08 DIAGNOSIS — I4891 Unspecified atrial fibrillation: Secondary | ICD-10-CM

## 2010-12-08 DIAGNOSIS — I10 Essential (primary) hypertension: Secondary | ICD-10-CM

## 2010-12-08 MED ORDER — OLMESARTAN MEDOXOMIL 20 MG PO TABS: 20.0000 mg | ORAL_TABLET | Freq: Two times a day (BID) | ORAL | Status: DC

## 2010-12-08 NOTE — Assessment & Plan Note (Addendum)
She's very stable. Her INR. levels have been therapeutic.  Her heart rate is a little slow and we will stop the digoxin. Dr. Cyndia Bent has previously decreased her digoxin to half its previous dose.   She will call me back if her heart rate increases.

## 2010-12-08 NOTE — Progress Notes (Signed)
[name] Date of Birth  21-Mar-1930 [facility] [address]    [address] [place], [place]  [address] [phone]  Fax  [phone]  History of Present Illness:  77 yo Hx of a-Fib, remote stroke, HTN, hypercholesterolemia.  Her last visit we added amlodipine 2.5 mg a day. She also started taking her Benicar 20 mg twice a day instead of 40 mg at once. She is feeling quite a bit better. Her blood pressure readings have stabilized. She still occasionally has some signs of mild hypertension with a systolic blood pressure 140.      She's not been limited by chest pain or shortness of breath.  She was having some fairly profound episodes of bradycardia. She was seen by her primary medical doctor. A digoxin level was drawn and she was found to be elevated. He cut her digoxin to 0.125 mg a day and she is doing better.  Current Outpatient Prescriptions on File Prior to Visit  Medication Sig Dispense Refill  . amLODipine (NORVASC) 2.5 MG tablet Take 1 tablet (2.5 mg total) by mouth daily.  30 tablet  12  . aspirin 81 MG tablet Take 81 mg by mouth daily.        Marland Kitchen atorvastatin (LIPITOR) 20 MG tablet Take 20 mg by mouth daily.        Marland Kitchen BENICAR 20 MG tablet take 2 tablets by mouth once daily  60 tablet  5  . Calcium Citrate-Vitamin D (CITRACAL + D PO) Take 800 mg by mouth 2 (two) times daily. 4 tablets daily       . Cholecalciferol (VITAMIN D) 1000 UNITS capsule Take 1,000 Units by mouth every other day.       . clopidogrel (PLAVIX) 75 MG tablet Take 75 mg by mouth daily.        . digoxin (LANOXIN) 0.25 MG tablet take 1 tablet by mouth once daily  90 tablet  3  . nebivolol (BYSTOLIC) 10 MG tablet Take 1 tablet (10 mg total) by mouth daily.  30 tablet  12  . omeprazole (PRILOSEC) 20 MG capsule Take 20 mg by mouth daily.        . sertraline (ZOLOFT) 50 MG tablet Take 50 mg by mouth daily.        Marland Kitchen warfarin (COUMADIN) 5 MG tablet take as directed  50 tablet  3    Allergies  Allergen  Reactions  . Alendronate Sodium   . Levofloxacin   . Shrimp (Shellfish Allergy)   . Sulfonamide Derivatives   . Tramadol     Past Medical History  Diagnosis Date  . Atrial fibrillation   . Stroke   . Aortic insufficiency   . Hypertension   . Hyperlipidemia   . SOB (shortness of breath)   . Headache   . Gout   . GERD (gastroesophageal reflux disease)   . Colitis   . Melanoma of nose   . Arthritis   . Anxiety   . Chronic anticoagulation     No past surgical history on file.  History  Smoking status  . Former Smoker -- 0.5 packs/day for 25 years  . Types: Cigarettes  . Quit date: 03/13/1990  Smokeless tobacco  . Not on file    History  Alcohol Use  . 2.5 oz/week  . 5 drink(s) per week    No family history on file.  Reviw of Systems:  Reviewed in the HPI.  All other systems are negative.  Physical Exam:  BP 122/68  Pulse 52  Ht 5\' 7"  (1.702 m)  Wt 141 lb 9.6 oz (64.229 kg)  BMI 22.18 kg/m2 The patient is alert and oriented x 3.  The mood and affect are normal.   Skin: warm and dry.  Color is normal.    HEENT:   the sclera are nonicteric.  The mucous membranes are moist.  The carotids are 2+ without bruits.  There is no thyromegaly.  There is no JVD.    Lungs: clear.  The chest wall is non tender.    Heart: regular rate with a normal S1 and S2.  There are no murmurs, gallops, or rubs. The PMI is not displaced.     Abdomen: good bowel sounds.  There is no guarding or rebound.  There is no hepatosplenomegaly or tenderness.  There are no masses.   Extremities:  no clubbing, cyanosis, or edema.  The legs are without rashes.  The distal pulses are intact.   Neuro:  Cranial nerves II - XII are intact.  Motor and sensory functions are intact.    The gait is normal.   Assessment / Plan:

## 2010-12-08 NOTE — Assessment & Plan Note (Addendum)
Her blood pressures have been well controlled since we changed the Benicar and added amlodipine.  She'll continue with her current medical regimen.

## 2010-12-08 NOTE — Patient Instructions (Signed)
-   Discontinue digoxin

## 2010-12-26 ENCOUNTER — Ambulatory Visit (INDEPENDENT_AMBULATORY_CARE_PROVIDER_SITE_OTHER): Payer: Medicare Other | Admitting: *Deleted

## 2010-12-26 DIAGNOSIS — I4891 Unspecified atrial fibrillation: Secondary | ICD-10-CM

## 2010-12-31 ENCOUNTER — Other Ambulatory Visit: Payer: Self-pay | Admitting: Cardiovascular Disease

## 2011-01-03 ENCOUNTER — Other Ambulatory Visit: Payer: Self-pay | Admitting: *Deleted

## 2011-01-03 MED ORDER — CLOPIDOGREL BISULFATE 75 MG PO TABS: 75.0000 mg | ORAL_TABLET | Freq: Every day | ORAL | Status: DC

## 2011-01-03 NOTE — Telephone Encounter (Signed)
Fax Received. Refill Completed. Arcadio Cope Chowoe (M.A)  

## 2011-02-06 ENCOUNTER — Ambulatory Visit (INDEPENDENT_AMBULATORY_CARE_PROVIDER_SITE_OTHER): Payer: Medicare Other | Admitting: *Deleted

## 2011-02-06 DIAGNOSIS — I4891 Unspecified atrial fibrillation: Secondary | ICD-10-CM

## 2011-02-06 LAB — POCT INR: INR: 2.1

## 2011-03-04 ENCOUNTER — Other Ambulatory Visit: Payer: Self-pay | Admitting: Cardiovascular Disease

## 2011-03-20 ENCOUNTER — Ambulatory Visit (INDEPENDENT_AMBULATORY_CARE_PROVIDER_SITE_OTHER): Payer: Medicare Other | Admitting: *Deleted

## 2011-03-20 DIAGNOSIS — I4891 Unspecified atrial fibrillation: Secondary | ICD-10-CM

## 2011-03-20 LAB — POCT INR: INR: 2

## 2011-03-20 NOTE — Patient Instructions (Signed)
Consistency with leafy green vegetables

## 2011-04-03 ENCOUNTER — Other Ambulatory Visit: Payer: Self-pay | Admitting: Cardiovascular Disease

## 2011-04-14 ENCOUNTER — Encounter: Payer: Self-pay | Admitting: Internal Medicine

## 2011-04-14 ENCOUNTER — Ambulatory Visit (INDEPENDENT_AMBULATORY_CARE_PROVIDER_SITE_OTHER): Payer: Medicare Other | Admitting: Internal Medicine

## 2011-04-14 ENCOUNTER — Ambulatory Visit (INDEPENDENT_AMBULATORY_CARE_PROVIDER_SITE_OTHER)
Admission: RE | Admit: 2011-04-14 | Discharge: 2011-04-14 | Disposition: A | Discharge status: Home / Self Care | Payer: Medicare Other | Source: Ambulatory Visit | Attending: Internal Medicine | Admitting: Internal Medicine

## 2011-04-14 VITALS — BP 122/72 | HR 56 | Ht 67.25 in | Wt 142.6 lb

## 2011-04-14 DIAGNOSIS — R0600 Dyspnea, unspecified: Secondary | ICD-10-CM

## 2011-04-14 DIAGNOSIS — J984 Other disorders of lung: Secondary | ICD-10-CM

## 2011-04-14 DIAGNOSIS — R0609 Other forms of dyspnea: Secondary | ICD-10-CM

## 2011-04-14 DIAGNOSIS — R0989 Other specified symptoms and signs involving the circulatory and respiratory systems: Secondary | ICD-10-CM

## 2011-04-14 NOTE — Progress Notes (Signed)
08/24/10- Dr Eula Listen of Dr Delton Coombes for chronic cough and PNDS. Has also been evaluated for a RUL nodule in past which is now deemed to be benign. Seen as add-on 6/13 with persistent to worsening cough, particularly in the past couple of weeks. Had 2 episodes of scant hemoptysis on 6/12. No increase in SOB. Denies CP or pleurodynia. Denies F/C/S, LE edema or calf tenderness. On warfarin for CAF. PT last checked approx 3 wks ago and has been in therapeutic range consistently. Denies overt epistaxis but thinks blood might have come from nose.  04/14/11- 53 yoF former smoker  Newly referred to [facility] by [facility] with complaint of trouble breathing when exercising. Husband is here. Over the last year she's been more aware of dyspnea with exertion. She is comfortable at rest. The sensation has been constant without any original event recognized, variation from day-to-day or progression. This is noticed mainly if she is walking up an incline, taking stairs quickly without stopping or otherwise exercising a little harder than usual ADLs. She was seen here in June as noted above will followup of her chronic cough problems which had been present for years. Cough has been mostly dry with occasional wheeze. Heavy dust exposure may be a trigger. Atrial fibrillation diagnosed about 2007, no pacemaker/Dr. Elease Hashimoto. Denies anemia.  Denies history of pneumonia or TB exposure. PPD negative in the past. She worked in the past/[place] with possibility of histoplasma exposure but no rebound liver is. No diagnosis of sarcoid. Soc- Married housewife, quit cigs 1992. CXR- 04/14/11- images reviewed: IMPRESSION:  No acute cardiopulmonary process. Central vascular congestion  without overt edema.  Evidence of prior granulomatous disease.  Original Report Authenticated By: Harrel Lemon, M.D.    ROS-see HPI Constitutional:   No-   weight loss, night sweats, fevers, chills, fatigue, lassitude. HEENT:    No-  headaches, difficulty swallowing, tooth/dental problems, sore throat,       No-  sneezing, itching, ear ache, nasal congestion, post nasal drip,  CV:  No-   chest pain, orthopnea, PND, swelling in lower extremities, anasarca, dizziness, palpitations Resp: + shortness of breath with exertion or at rest.              No-   productive cough,  + non-productive cough,  No- coughing up of blood.              No-   change in color of mucus.  No- wheezing.   Skin: No-   rash or lesions. GI:  No-   heartburn, indigestion, abdominal pain, nausea, vomiting, diarrhea,                 change in bowel habits, loss of appetite GU: No-   dysuria,  MS:  No-   joint pain or swelling.  No- decreased range of motion.  No- back pain. Neuro-     nothing unusual Psych:  No- change in mood or affect. No depression or anxiety.  No memory loss.  OBJ- Physical Exam General- Alert, Oriented, Affect-appropriate, Distress- none acute, slender Skin- rash-none, lesions- none, excoriation- none Lymphadenopathy- none Head- atraumatic            Eyes- Gross vision intact, PERRLA, conjunctivae and secretions clear            Ears- Hearing, canals-normal            Nose- Clear, no-Septal dev, mucus, polyps, erosion, perforation  Throat- Mallampati II , mucosa clear , drainage- none, tonsils- atrophic Neck- flexible , trachea midline, no stridor , thyroid nl, carotid no bruit Chest - symmetrical excursion , unlabored           Heart/CV- RRR , no murmur , no gallop  , no rub, nl s1 s2                           - JVD- none , edema- none, stasis changes- none, varices- none           Lung- clear to P&A, unlabored, wheeze- none, cough- none , dullness-none, rub- none. No cough noted.           Chest wall-  Abd- tender-no, distended-no, bowel sounds-present, HSM- no Br/ Gen/ Rectal- Not done, not indicated Extrem- cyanosis- none, clubbing, none, atrophy- none, strength- nl Neuro- grossly intact to  observation

## 2011-04-14 NOTE — Patient Instructions (Signed)
Order - schedule PFT                 Dx Dyspnea              Schedule 6 MWT               CXR

## 2011-04-18 NOTE — Assessment & Plan Note (Signed)
Calcified granulomas may reflect old histoplasmosis in the absence of TB exposure or history of pneumonia. Granuloma with calcification can erode into an airway and cause cough, sometimes associated with some bleeding. This can be watched.

## 2011-04-18 NOTE — Assessment & Plan Note (Signed)
Exertional dyspnea described as having been present for about a year, not progressive and with little day-to-day variation. I suspect some relation to her atrial fibrillation/related medications, and an uncertain amount of underlying lung disease residual from her years of smoking. Plan-PFT, 6 minute walk test, update chest x-ray.

## 2011-04-20 ENCOUNTER — Ambulatory Visit (INDEPENDENT_AMBULATORY_CARE_PROVIDER_SITE_OTHER): Payer: Medicare Other | Admitting: Internal Medicine

## 2011-04-20 DIAGNOSIS — R0609 Other forms of dyspnea: Secondary | ICD-10-CM

## 2011-04-20 DIAGNOSIS — R0600 Dyspnea, unspecified: Secondary | ICD-10-CM

## 2011-04-20 DIAGNOSIS — R0989 Other specified symptoms and signs involving the circulatory and respiratory systems: Secondary | ICD-10-CM

## 2011-04-20 LAB — PULMONARY FUNCTION TEST

## 2011-04-20 NOTE — Progress Notes (Signed)
PFT done today. 

## 2011-04-28 NOTE — Progress Notes (Signed)
Quick Note:  Pt aware of results. ______ 

## 2011-05-01 ENCOUNTER — Ambulatory Visit (INDEPENDENT_AMBULATORY_CARE_PROVIDER_SITE_OTHER): Payer: Medicare Other

## 2011-05-01 ENCOUNTER — Other Ambulatory Visit: Payer: Self-pay | Admitting: Cardiovascular Disease

## 2011-05-01 ENCOUNTER — Telehealth: Payer: Self-pay

## 2011-05-01 DIAGNOSIS — I4891 Unspecified atrial fibrillation: Secondary | ICD-10-CM

## 2011-05-01 NOTE — Telephone Encounter (Signed)
Message copied by Migdalia Dk on Mon May 01, 2011  3:37 PM ------      Message from: Vesta Mixer      Created: Mon May 01, 2011  2:38 PM       OK to hold coumadin for 5 days prior to eye surgery.  No need for bridging.                  ----- Message -----         From: Luan Moore, RN         Sent: 05/01/2011  10:41 AM           To: Elyn Aquas., MD, Sable Feil, RN            Pt is pending eye surgery at [facility] on 04/07/11.  Pt states she was told by eye MD she will need to hold Coumadin prior to surgery.  Pt is unsure how long she will need to hold Coumadin prior to procedure, but she has an appt with them tomorrow for detailed instructions.   Please advise if ok to hold Coumadin and if Lovenox bridging is needed.  Thanks

## 2011-05-01 NOTE — Telephone Encounter (Signed)
Called spoke with pt advised ok per Dr Elease Hashimoto to hold Coumadin x 5 days prior to eye surgery, no Lovenox bridge necessary.

## 2011-05-02 ENCOUNTER — Telehealth: Payer: Self-pay | Admitting: Cardiovascular Disease

## 2011-05-02 NOTE — Telephone Encounter (Signed)
Left message for pt to call back.  Need to know if she is taking both Plavix and coumadin?

## 2011-05-02 NOTE — Telephone Encounter (Signed)
KATHY, needs to get ok for pt to go off plavix for 5 days

## 2011-05-02 NOTE — Telephone Encounter (Signed)
Aware that pt states MD doing the surgery told her ok to stay on Plavix.  Pt will hold coumadin as instructed

## 2011-05-12 ENCOUNTER — Encounter: Payer: Self-pay | Admitting: Internal Medicine

## 2011-05-12 ENCOUNTER — Ambulatory Visit (INDEPENDENT_AMBULATORY_CARE_PROVIDER_SITE_OTHER): Payer: Medicare Other | Admitting: Internal Medicine

## 2011-05-12 VITALS — BP 122/72 | HR 60 | Ht 67.25 in | Wt 144.8 lb

## 2011-05-12 DIAGNOSIS — R0989 Other specified symptoms and signs involving the circulatory and respiratory systems: Secondary | ICD-10-CM

## 2011-05-12 NOTE — Progress Notes (Signed)
08/24/10- Dr Eula Listen of Dr Delton Coombes for chronic cough and PNDS. Has also been evaluated for a RUL nodule in past which is now deemed to be benign. Seen as add-on 6/13 with persistent to worsening cough, particularly in the past couple of weeks. Had 2 episodes of scant hemoptysis on 6/12. No increase in SOB. Denies CP or pleurodynia. Denies F/C/S, LE edema or calf tenderness. On warfarin for CAF. PT last checked approx 3 wks ago and has been in therapeutic range consistently. Denies overt epistaxis but thinks blood might have come from nose.  04/14/11- 12 yoF former smoker  Newly referred to [facility] by  [facility] with complaint of trouble breathing when exercising. Husband is here. Over the last year she's been more aware of dyspnea with exertion. She is comfortable at rest. The sensation has been constant without any original event recognized, variation from day-to-day or progression. This is noticed mainly if she is walking up an incline, taking stairs quickly without stopping or otherwise exercising a little harder than usual ADLs. She was seen here in June as noted above will followup of her chronic cough problems which had been present for years. Cough has been mostly dry with occasional wheeze. Heavy dust exposure may be a trigger. Atrial fibrillation diagnosed about 2007, no pacemaker/Dr. Elease Hashimoto. Denies anemia.  Denies history of pneumonia or TB exposure. PPD negative in the past. She worked in the past/[place] with possibility of histoplasma exposure but no rebound liver is. No diagnosis of sarcoid. Soc- Married housewife, quit cigs 1992. CXR- 04/14/11- images reviewed: IMPRESSION:  No acute cardiopulmonary process. Central vascular congestion  without overt edema.  Evidence of prior granulomatous disease.  Original Report Authenticated By: Harrel Lemon, M.D.   05/12/11- 85 yoF former smoker followed for dyspnea on exertion, complicated by hx lung nodule, GERD, AFib,  HBP, TIA. Little cough or phlegm. Still gets short of breath with exertion. Tolerated eye surgery. PFT: 04/20/2011-mild obstructive airways disease with insignificant response to bronchodilator, air trapping, diffusion mildly reduced. FEV1/FVC 0.63, DLCO 76%. 6 minute walk test-97%, 96%, 98%, 513 m. Well-maintained oxygenation.  ROS-see HPI Constitutional:   No-   weight loss, night sweats, fevers, chills, fatigue, lassitude. HEENT:   No-  headaches, difficulty swallowing, tooth/dental problems, sore throat,       No-  sneezing, itching, ear ache, nasal congestion, post nasal drip,  CV:  No-   chest pain, orthopnea, PND, swelling in lower extremities, anasarca, dizziness, palpitations Resp: + shortness of breath with exertion or at rest.              No-   productive cough,  + non-productive cough,  No- coughing up of blood.              No-   change in color of mucus.  No- wheezing.   Skin: No-   rash or lesions. GI:  No-   heartburn, indigestion, abdominal pain, nausea, vomiting, diarrhea,                 change in bowel habits, loss of appetite GU: No-   dysuria,  MS:  No-   joint pain or swelling. No- back pain. Neuro-     nothing unusual Psych:  No- change in mood or affect. No depression or anxiety.  No memory loss.  OBJ- Physical Exam General- Alert, Oriented, Affect-appropriate, Distress- none acute, medium build Skin- rash-none, lesions- none, excoriation- none Lymphadenopathy- none Head- atraumatic  Eyes- Gross vision intact, PERRLA, conjunctivae and secretions clear            Ears- Hearing, canals-normal            Nose- Clear, no-Septal dev, mucus, polyps, erosion, perforation             Throat- Mallampati II , mucosa clear , drainage- none, tonsils- atrophic Neck- flexible , trachea midline, no stridor , thyroid nl, carotid no bruit Chest - symmetrical excursion , unlabored           Heart/CV- RRR , no murmur , no gallop  , no rub, nl s1 s2                            - JVD- none , edema- none, stasis changes- none, varices- none           Lung- clear to P&A, unlabored, wheeze- none, cough- none , dullness-none, rub- none. No cough noted.           Chest wall-  Abd-  Br/ Gen/ Rectal- Not done, not indicated Extrem- cyanosis- none, clubbing, none, atrophy- none, strength- nl Neuro- grossly intact to observation

## 2011-05-12 NOTE — Patient Instructions (Signed)
Sample and script Tudorza    1 puff twice daily  Walk and other activities to maintain some stamina

## 2011-05-15 ENCOUNTER — Emergency Department (HOSPITAL_COMMUNITY)
Admission: EM | Admit: 2011-05-15 | Discharge: 2011-05-16 | Discharge status: Against Medical Advice | Payer: Medicare Other | Attending: Emergency Medicine | Admitting: Emergency Medicine

## 2011-05-15 ENCOUNTER — Encounter (HOSPITAL_COMMUNITY): Payer: Self-pay | Admitting: Emergency Medicine

## 2011-05-15 DIAGNOSIS — Z7982 Long term (current) use of aspirin: Secondary | ICD-10-CM | POA: Insufficient documentation

## 2011-05-15 DIAGNOSIS — H5710 Ocular pain, unspecified eye: Secondary | ICD-10-CM | POA: Insufficient documentation

## 2011-05-15 DIAGNOSIS — Z85820 Personal history of malignant melanoma of skin: Secondary | ICD-10-CM | POA: Insufficient documentation

## 2011-05-15 DIAGNOSIS — I4891 Unspecified atrial fibrillation: Secondary | ICD-10-CM | POA: Insufficient documentation

## 2011-05-15 DIAGNOSIS — K219 Gastro-esophageal reflux disease without esophagitis: Secondary | ICD-10-CM | POA: Insufficient documentation

## 2011-05-15 DIAGNOSIS — Z7901 Long term (current) use of anticoagulants: Secondary | ICD-10-CM | POA: Insufficient documentation

## 2011-05-15 DIAGNOSIS — F411 Generalized anxiety disorder: Secondary | ICD-10-CM | POA: Insufficient documentation

## 2011-05-15 DIAGNOSIS — I10 Essential (primary) hypertension: Secondary | ICD-10-CM

## 2011-05-15 DIAGNOSIS — Z87891 Personal history of nicotine dependence: Secondary | ICD-10-CM | POA: Insufficient documentation

## 2011-05-15 DIAGNOSIS — E785 Hyperlipidemia, unspecified: Secondary | ICD-10-CM | POA: Insufficient documentation

## 2011-05-15 DIAGNOSIS — M129 Arthropathy, unspecified: Secondary | ICD-10-CM | POA: Insufficient documentation

## 2011-05-15 DIAGNOSIS — R51 Headache: Secondary | ICD-10-CM | POA: Insufficient documentation

## 2011-05-15 DIAGNOSIS — I359 Nonrheumatic aortic valve disorder, unspecified: Secondary | ICD-10-CM | POA: Insufficient documentation

## 2011-05-15 NOTE — ED Notes (Signed)
Pt had eye surgery on her R eye. Pt is unsure of exact procedure. Pt continues to have pain in that eye and c/o HA. No distress noted. Pt thinks she might have a "bug".

## 2011-05-16 ENCOUNTER — Other Ambulatory Visit: Payer: Self-pay

## 2011-05-16 MED ORDER — PROPARACAINE HCL 0.5 % OP SOLN
1.0000 [drp] | Freq: Once | OPHTHALMIC | Status: DC
  Filled 2011-05-16 (×2): qty 15

## 2011-05-16 MED ORDER — FLUORESCEIN SODIUM 1 MG OP STRP
1.0000 | ORAL_STRIP | Freq: Once | OPHTHALMIC | Status: DC
  Filled 2011-05-16: qty 1

## 2011-05-16 NOTE — ED Notes (Signed)
Pt and family upset about care received from [facility]. Per family and pt, nursing care was great, but they did not receive good care from [facility]. Asked pt if she would like to speak with someone in charge and she declined.

## 2011-05-16 NOTE — Discharge Instructions (Signed)
Hypertension Information As your heart beats, it forces blood through your arteries. This force is your blood pressure. If the pressure is too high, it is called hypertension (HTN) or high blood pressure. HTN is dangerous because you may have it and not know it. High blood pressure may mean that your heart has to work harder to pump blood. Your arteries may be narrow or stiff. The extra work puts you at risk for heart disease, stroke, and other problems.  Blood pressure consists of two numbers, a higher number over a lower, 110/72, for example. It is stated as "110 over 72." The ideal is below 120 for the top number (systolic) and under 80 for the bottom (diastolic).  You should pay close attention to your blood pressure if you have certain conditions such as:  Heart failure.   Prior heart attack.   Diabetes   Chronic kidney disease.   Prior stroke.   Multiple risk factors for heart disease.  To see if you have HTN, your blood pressure should be measured while you are seated with your arm held at the level of the heart. It should be measured at least twice. A one-time elevated blood pressure reading (especially in the Emergency Department) does not mean that you need treatment. There may be conditions in which the blood pressure is different between your right and left arms. It is important to see your caregiver soon for a recheck. Most people have essential hypertension which means that there is not a specific cause. This type of high blood pressure may be lowered by changing lifestyle factors such as:  Stress.   Smoking.   Lack of exercise.   Excessive weight.   Drug/tobacco/alcohol use.   Eating less salt.  Most people do not have symptoms from high blood pressure until it has caused damage to the body. Effective treatment can often prevent, delay or reduce that damage. TREATMENT  Treatment for high blood pressure, when a cause has been identified, is directed at the cause. There  are a large number of medications to treat HTN. These fall into several categories, and your caregiver will help you select the medicines that are best for you. Medications may have side effects. You should review side effects with your caregiver. If your blood pressure stays high after you have made lifestyle changes or started on medicines,   Your medication(s) may need to be changed.   Other problems may need to be addressed.   Be certain you understand your prescriptions, and know how and when to take your medicine.   Be sure to follow up with your caregiver within the time frame advised (usually within two weeks) to have your blood pressure rechecked and to review your medications.   If you are taking more than one medicine to lower your blood pressure, make sure you know how and at what times they should be taken. Taking two medicines at the same time can result in blood pressure that is too low.  Document Released: 05/02/2005 Document Revised: 11/09/2010 Document Reviewed: 05/09/2007 [facility] Patient Information 2012 [place], [place].Hypertension Hypertension is another name for high blood pressure. High blood pressure may mean that your heart needs to work harder to pump blood. Blood pressure consists of two numbers, which includes a higher number over a lower number (example: 110/72). HOME CARE   Make lifestyle changes as told by your doctor. This may include weight loss and exercise.   Take your blood pressure medicine every day.   Limit  how much salt you use.   Stop smoking if you smoke.   Do not use drugs.   Talk to your doctor if you are using decongestants or birth control pills. These medicines might make blood pressure higher.   Females should not drink more than 1 alcoholic drink per day. Males should not drink more than 2 alcoholic drinks per day.   See your doctor as told.  GET HELP RIGHT AWAY IF:   You have a blood pressure reading with a top number of 180 or  higher.   You get a very bad headache.   You get blurred or changing vision.   You feel confused.   You feel weak, numb, or faint.   You get chest or belly (abdominal) pain.   You throw up (vomit).   You cannot breathe very well.  MAKE SURE YOU:   Understand these instructions.   Will watch your condition.   Will get help right away if you are not doing well or get worse.  Document Released: 08/16/2007 Document Revised: 02/16/2011 Document Reviewed: 08/16/2007 [facility] Patient Information 2012 [place], [place].  RESOURCE GUIDE  Dental Problems  Patients with Medicaid: [facility] [phone] [address]                                           [phone] [address] Phone:  [phone]                                                  Phone:  [phone]  If unable to pay or uninsured, contact:  [facility] or [facility] to become qualified for the adult dental clinic.  Chronic Pain Problems Contact [facility]  [phone] Patients need to be referred by their primary care doctor.  Insufficient Money for Medicine Contact [employer]:  call "211" or [facility] 681-298-6342.  No Primary Care Doctor Call [facility]  226-204-9611 Other agencies that provide inexpensive medical care    [facility]  [phone]    [facility]  [phone]    [facility]  [phone]    [facility]  [phone]    [facility]  [phone]    [facility]  [phone]  Psychological Services [facility]  [phone] [facility]  [phone] [facility]   [phone] (emergency services [phone])  Substance Abuse Resources [facility]  2541808802 [facility] [phone] [facility] 365-205-2596 Floydene Flock [phone] Residential & Outpatient Substance Abuse Program   ([phone]  Abuse/Neglect [facility] Child Abuse Hotline 580-696-1287 [facility] Child Abuse Hotline (478)520-1142 (After Hours)  Emergency Shelter [facility] (224)793-6329  Maternity Homes Room at the [place] [facility] [place] ([phone] [facility] [phone]  MRSA Hotline #:   239-716-8581    [facility]  [facility] Dept. [address] [place]                       [address]      [address] [place] [address]  Blondell Reveal Phone:  [phone]                                   Phone:  [phone]                 Phone:  [phone]  [facility] Phone:  [phone]  [facility] Child Abuse Hotline [phone] (After Hours)

## 2011-05-16 NOTE — ED Provider Notes (Signed)
History    78 year old female with headache and right eye pain. Patient just had recent right eye surgery, but she is unsure of the specifics of this. She has had some mild right eye discomfort since then it has increased in the last day and is now associated with a diffuse headache. Foreign body sensation in her right eye. Symptoms are constant with no appreciable exacerbating relieving factors. She reports no acute change in her visual acuity. Denies trauma. No fevers or chills. No tearing or discharge. No facial swelling.  CSN: [id number]  Arrival date & time 05/15/11  2338   First MD Initiated Contact with Patient 05/16/11 0125      Chief Complaint  Patient presents with  . Hypertension    (Consider location/radiation/quality/duration/timing/severity/associated sxs/prior treatment) HPI  Past Medical History  Diagnosis Date  . Atrial fibrillation   . Stroke   . Aortic insufficiency   . Hypertension   . Hyperlipidemia   . SOB (shortness of breath)   . Headache   . Gout   . GERD (gastroesophageal reflux disease)   . Colitis   . Melanoma of nose   . Arthritis   . Anxiety   . Chronic anticoagulation     No past surgical history on file.  No family history on file.  History  Substance Use Topics  . Smoking status: Former Smoker -- 0.5 packs/day for 25 years    Types: Cigarettes    Quit date: 03/13/1990  . Smokeless tobacco: Not on file  . Alcohol Use: 2.5 oz/week    5 drink(s) per week    OB History    Grav Para Term Preterm Abortions TAB SAB Ect Mult Living                  Review of Systems   Review of symptoms negative unless otherwise noted in HPI.   Allergies  Shrimp; Alendronate sodium; Levofloxacin; Pradaxa; Sulfonamide derivatives; and Tramadol  Home Medications   Current Outpatient Rx  Name Route Sig Dispense Refill  . AMLODIPINE BESYLATE 2.5 MG PO TABS Oral Take 1 tablet (2.5 mg total) by mouth daily. 30 tablet 12  . ASPIRIN 81 MG PO TABS  Oral Take 81 mg by mouth daily.      . ATORVASTATIN CALCIUM 20 MG PO TABS  take 1 tablet by mouth at bedtime 30 tablet 12  . CITRACAL + D PO Oral Take 800 mg by mouth 2 (two) times daily. 4 tablets daily     . VITAMIN D 1000 UNITS PO CAPS Oral Take 1,000 Units by mouth every other day.     . CLOPIDOGREL BISULFATE 75 MG PO TABS Oral Take 1 tablet (75 mg total) by mouth daily. 90 tablet 1  . DIGOXIN 0.125 MG PO TABS Oral Take 125 mcg by mouth daily.    . NEBIVOLOL HCL 10 MG PO TABS Oral Take 1 tablet (10 mg total) by mouth daily. 30 tablet 12  . OLMESARTAN MEDOXOMIL 20 MG PO TABS Oral Take 20 mg by mouth 2 (two) times daily.    Marland Kitchen OMEPRAZOLE 20 MG PO CPDR Oral Take 20 mg by mouth daily.      . SERTRALINE HCL 50 MG PO TABS Oral Take 50 mg by mouth daily.      . WARFARIN SODIUM 5 MG PO TABS Oral Take 5-7.5 mg by mouth daily. Take 1(5mg )  tablet all days except Wednesday & Saturday take 1.5 tablets (7.5mg )    . EPIPEN 2-PAK 0.3  MG/0.3ML IJ DEVI        BP 169/90  Pulse 65  Temp(Src) 97.3 F (36.3 C) (Oral)  Resp 19  SpO2 98%  Physical Exam  Nursing note and vitals reviewed. Constitutional: She appears well-developed and well-nourished. No distress.  HENT:  Head: Normocephalic and atraumatic.  Eyes: Conjunctivae are normal. Right eye exhibits no discharge. Left eye exhibits no discharge.       Periorbital tissues are unremarkable. Corneas clear. No obvious hyphema or hypopyon. Extraocular muscles are intact. Mild conjunctival injection bilaterally. No proptosis. Lids everted and no foreign body noted  Neck: Neck supple.  Cardiovascular: Normal rate, regular rhythm and normal heart sounds.  Exam reveals no gallop and no friction rub.   No murmur heard. Pulmonary/Chest: Effort normal and breath sounds normal. No respiratory distress.  Abdominal: Soft. She exhibits no distension. There is no tenderness.  Musculoskeletal: She exhibits no edema and no tenderness.  Neurological: She is alert.    Skin: Skin is warm and dry.  Psychiatric: She has a normal mood and affect. Her behavior is normal. Thought content normal.    ED Course  Procedures (including critical care time)  Labs Reviewed - No data to display No results found.   1. Hypertension   2. Eye pain    4:11 AM Patient and her husband are unhappy. "We don't feel like we are very important in this organization." I was busy with other patients and apologized. Explained plan and that would like to speak with her eye doctor after exam. Pt and husband do not want for this. York Spaniel they would rather just follow-up with her eye doctor tomorrow.   MDM  78 year old female with headache and right eye pain. Patient is status post recent right eye surgery. Brief examination was fairly unremarkable. Planned to return for fluorescein testing, testing intraocular pressures and slit-lamp exam. Was side tracked with a sick patient and there was a prolonged delay returning to patient. When returning with patient and her husband stated that they would just rather leave. Understand that she did not receive an adequate examination and could have sight threatening etiology of her. The understand the risk and would rather followup with her eye doctor. Return precautions were discussed.        Raeford Razor, MD 05/22/11 Ric[facility] Primus

## 2011-05-16 NOTE — ED Notes (Signed)
Pt states that she has been having difficulty maintaining her BP since her eye surgery last week. And 05/15/11 her BP was in 200s per her primary doctor. They told her to come to the ED.

## 2011-05-16 NOTE — ED Notes (Signed)
Pt and family upset about the wait time to see EDP. Explained to the pt and family the plan of care. Both stated that they understand and they will wait for the doctor to come in room. Made EDP aware that pt is upset and would like to see them.

## 2011-05-16 NOTE — ED Notes (Signed)
Family came out of pts rooms stating that EDP still has not came in the room to look at pts eye. Spoke with EDP and he stated that he will be in there in a few minutes. Informed pt and family.

## 2011-05-16 NOTE — ED Notes (Signed)
Family and pt upset because of their wait time. Spoke with EDP following up about pt. Explained to pt and family about EDP plans. Stated they are aware. Stirp and eye drops at bedside. EDP aware.

## 2011-05-17 ENCOUNTER — Ambulatory Visit (INDEPENDENT_AMBULATORY_CARE_PROVIDER_SITE_OTHER): Payer: Medicare Other | Admitting: *Deleted

## 2011-05-17 DIAGNOSIS — I4891 Unspecified atrial fibrillation: Secondary | ICD-10-CM

## 2011-05-17 NOTE — Assessment & Plan Note (Signed)
Mild obstructive airways disease and good oxygenation with respectable distance walked. These don't suggest much of an exercise limitation by her lungs at this exercise level. Recognizes some additional cardiac role but there is room to improve stamina with exercise and training.

## 2011-06-06 ENCOUNTER — Encounter: Payer: Self-pay | Admitting: Cardiovascular Disease

## 2011-06-06 ENCOUNTER — Ambulatory Visit (INDEPENDENT_AMBULATORY_CARE_PROVIDER_SITE_OTHER): Payer: Medicare Other | Admitting: *Deleted

## 2011-06-06 ENCOUNTER — Ambulatory Visit (INDEPENDENT_AMBULATORY_CARE_PROVIDER_SITE_OTHER): Payer: Medicare Other | Admitting: Cardiovascular Disease

## 2011-06-06 VITALS — BP 143/65 | HR 68 | Ht 67.0 in | Wt 142.8 lb

## 2011-06-06 DIAGNOSIS — I4891 Unspecified atrial fibrillation: Secondary | ICD-10-CM

## 2011-06-06 DIAGNOSIS — I10 Essential (primary) hypertension: Secondary | ICD-10-CM

## 2011-06-06 NOTE — Assessment & Plan Note (Signed)
Natalie Ho has had some very his blood pressure readings. Some readings are high and symmetric quite low. I don't think that we should increase her blood pressure medicines at this time so she's still having some systolic blood pressure readings of 90-100. She will keep a detailed blood pressure log. She'll try to eat consistent of salt. I'll see her in 3 months for followup visit.

## 2011-06-06 NOTE — Assessment & Plan Note (Addendum)
Sham  remains in atrial fibrillation. She's on chronic anticoagulation. Her rate is well controlled.

## 2011-06-06 NOTE — Progress Notes (Signed)
Natalie Ho Public Date of Birth  [birth year] [facility] [address]    [address] [place], [place]  [address] [phone]  Fax  [phone]  Problems: 1. Atrial fibrillation 2. Remote stroke 3. Aortic insufficiency 4. Hypertension 5. Hypercholesterolemia  History of Present Illness:  78 yo Hx of a-Fib, remote stroke, HTN, hypercholesterolemia.  Her last visit we added amlodipine 2.5 mg a day. She also started taking her Benicar 20 mg twice a day instead of 40 mg at once. She is feeling quite a bit better. Her blood pressure readings have stabilized. She still occasionally has some signs of mild hypertension with a systolic blood pressure 140.      She also has episodes where her blood pressure will become fairly low especially at night. They're given some nights when she has held her medication for several hours because her blood pressure was in the 90 range.  She's not been limited by chest pain or shortness of breath.  She has been having some problems with hypertension.     Current Outpatient Prescriptions on File Prior to Visit  Medication Sig Dispense Refill  . amLODipine (NORVASC) 2.5 MG tablet Take 1 tablet (2.5 mg total) by mouth daily.  30 tablet  12  . aspirin 81 MG tablet Take 81 mg by mouth daily.        Marland Kitchen atorvastatin (LIPITOR) 20 MG tablet take 1 tablet by mouth at bedtime  30 tablet  12  . Calcium Citrate-Vitamin D (CITRACAL + D PO) Take 800 mg by mouth 2 (two) times daily. 4 tablets daily       . Cholecalciferol (VITAMIN D) 1000 UNITS capsule Take 1,000 Units by mouth every other day.       . clopidogrel (PLAVIX) 75 MG tablet Take 1 tablet (75 mg total) by mouth daily.  90 tablet  1  . digoxin (LANOXIN) 0.125 MG tablet Take 125 mcg by mouth daily.      Marland Kitchen EPIPEN 2-PAK 0.3 MG/0.3ML DEVI       . nebivolol (BYSTOLIC) 10 MG tablet Take 1 tablet (10 mg total) by mouth daily.  30 tablet  12  . olmesartan (BENICAR) 20 MG tablet Take 20 mg by mouth 2 (two) times  daily.      Marland Kitchen omeprazole (PRILOSEC) 20 MG capsule Take 20 mg by mouth daily.        . sertraline (ZOLOFT) 50 MG tablet Take 50 mg by mouth daily.        Marland Kitchen warfarin (COUMADIN) 5 MG tablet Take 5-7.5 mg by mouth daily. Take 1(5mg )  tablet all days except Wednesday & Saturday take 1.5 tablets (7.5mg )        Allergies  Allergen Reactions  . Shrimp (Shellfish Allergy) Anaphylaxis and Swelling  . Alendronate Sodium Other (See Comments)    Unknown reaction  . Levofloxacin Other (See Comments)    "made her funny in the head"  . Pradaxa Other (See Comments)    Upset stomach  . Sulfonamide Derivatives Other (See Comments)    Unknown reaction  . Tramadol Other (See Comments)    Unknown reaction    Past Medical History  Diagnosis Date  . Atrial fibrillation   . Stroke   . Aortic insufficiency   . Hypertension   . Hyperlipidemia   . SOB (shortness of breath)   . Headache   . Gout   . GERD (gastroesophageal reflux disease)   . Colitis   . Melanoma of nose   .  Arthritis   . Anxiety   . Chronic anticoagulation     No past surgical history on file.  History  Smoking status  . Former Smoker -- 0.5 packs/day for 25 years  . Types: Cigarettes  . Quit date: 03/13/1990  Smokeless tobacco  . Not on file    History  Alcohol Use  . 2.5 oz/week  . 5 drink(s) per week    No family history on file.  Reviw of Systems:  Reviewed in the HPI.  All other systems are negative.  Physical Exam: BP 143/65  Pulse 68  Ht 5\' 7"  (1.702 m)  Wt 142 lb 12.8 oz (64.774 kg)  BMI 22.37 kg/m2 The patient is alert and oriented x 3.  The mood and affect are normal.   Skin: warm and dry.  Color is normal.    HEENT:   the sclera are nonicteric.  The mucous membranes are moist.  The carotids are 2+ without bruits.  There is no thyromegaly.  There is no JVD.    Lungs: clear.  The chest wall is non tender.    Heart: Irregularly irregular normal S1-S2.Marland Kitchen  There are no murmurs, gallops, or rubs. The  PMI is not displaced.     Abdomen: good bowel sounds.  There is no guarding or rebound.  There is no hepatosplenomegaly or tenderness.  There are no masses.   Extremities:  no clubbing, cyanosis, or edema.  The legs are without rashes.  The distal pulses are intact.   Neuro:  Cranial nerves II - XII are intact.  Motor and sensory functions are intact.    The gait is normal.   Assessment / Plan:

## 2011-06-06 NOTE — Patient Instructions (Signed)
Your physician recommends that you schedule a follow-up appointment in: 3 months   Please bring bp log with you on appointment and call if you have any questions or concerns

## 2011-06-22 ENCOUNTER — Telehealth: Payer: Self-pay | Admitting: Internal Medicine

## 2011-06-22 MED ORDER — AMOXICILLIN 500 MG PO CAPS: 500.0000 mg | ORAL_CAPSULE | Freq: Three times a day (TID) | ORAL | Status: AC

## 2011-06-22 MED ORDER — PROMETHAZINE-CODEINE 6.25-10 MG/5ML PO SYRP: 5.0000 mL | ORAL_SOLUTION | ORAL | Status: DC | PRN

## 2011-06-22 NOTE — Telephone Encounter (Signed)
Spoke with pt's spouse and made aware of appt date/time. He verbalized understanding and states nothing further needed.

## 2011-06-22 NOTE — Telephone Encounter (Signed)
Called and spoke with pts husband---he stated that the pt has been coughing x 10 days.  The last 2 days the sputum has been mixed with blood--not bright red blood but she is unable to tell if the sputum is any other color due to the blood.  Pt is SOB and wheezing but was able to talk in complete sentences.  Pt is on coumadin and is followed by the coumadin clinic.  CY please advise. Thanks  Allergies  Allergen Reactions  . Shrimp (Shellfish Allergy) Anaphylaxis and Swelling  . Alendronate Sodium Other (See Comments)    Unknown reaction  . Levofloxacin Other (See Comments)    "made her funny in the head"  . Pradaxa Other (See Comments)    Upset stomach  . Sulfonamide Derivatives Other (See Comments)    Unknown reaction  . Tramadol Other (See Comments)    Unknown reaction

## 2011-06-22 NOTE — Telephone Encounter (Signed)
Offer  Phenergan/ codeine cough syrup. 150 ml,  5 ml  Every 4 hours as needed for cough Amoxacillin 500 mg # 21,    1 three times daily Early f/u here.

## 2011-06-22 NOTE — Telephone Encounter (Signed)
I spoke with pt and is aware of CDY recs. She voiced her understanding and rx's have been sent to the pharmacy. No apts available earlier. Please advise Natalie Ho were to work pt in thanks

## 2011-06-22 NOTE — Telephone Encounter (Signed)
Please let pt know that CY had open appt for Monday 06-26-2011 at 10am. Please have her come in then; I have put her on the schedule.

## 2011-06-23 ENCOUNTER — Telehealth: Payer: Self-pay | Admitting: Internal Medicine

## 2011-06-23 MED ORDER — PROMETHAZINE-CODEINE 6.25-10 MG/5ML PO SYRP: 5.0000 mL | ORAL_SOLUTION | ORAL | Status: AC | PRN

## 2011-06-23 NOTE — Telephone Encounter (Signed)
Called and spoke with pt and she stated that she dropped the cough med---phenergan with codeine that was filled on 4-11  150 ML.  Pt is requesting that this be called into her pharmacy again.  CY please advise. Thanks  Allergies  Allergen Reactions  . Shrimp (Shellfish Allergy) Anaphylaxis and Swelling  . Alendronate Sodium Other (See Comments)    Unknown reaction  . Levofloxacin Other (See Comments)    "made her funny in the head"  . Pradaxa Other (See Comments)    Upset stomach  . Sulfonamide Derivatives Other (See Comments)    Unknown reaction  . Tramadol Other (See Comments)    Unknown reaction

## 2011-06-23 NOTE — Telephone Encounter (Signed)
Called this med to the pharmacy for the pt.  Called and spoke with the pt and she is aware of rx called to the pharmacy.

## 2011-06-23 NOTE — Telephone Encounter (Signed)
Per CDY, ok to refill this.

## 2011-06-26 ENCOUNTER — Encounter: Payer: Self-pay | Admitting: Internal Medicine

## 2011-06-26 ENCOUNTER — Ambulatory Visit (INDEPENDENT_AMBULATORY_CARE_PROVIDER_SITE_OTHER): Payer: Medicare Other | Admitting: Internal Medicine

## 2011-06-26 VITALS — BP 102/68 | HR 65 | Ht 67.25 in | Wt 140.6 lb

## 2011-06-26 DIAGNOSIS — J45909 Unspecified asthma, uncomplicated: Secondary | ICD-10-CM

## 2011-06-26 MED ORDER — BENZONATATE 200 MG PO CAPS: 200.0000 mg | ORAL_CAPSULE | Freq: Three times a day (TID) | ORAL | Status: AC | PRN

## 2011-06-26 MED ORDER — MOMETASONE FURO-FORMOTEROL FUM 100-5 MCG/ACT IN AERO: INHALATION_SPRAY | RESPIRATORY_TRACT | Status: DC

## 2011-06-26 NOTE — Progress Notes (Signed)
08/24/10- Dr Eula Listen of Dr [facility] for chronic cough and PNDS. Has also been evaluated for a RUL nodule in past which is now deemed to be benign. Seen as add-on 6/13 with persistent to worsening cough, particularly in the past couple of weeks. Had 2 episodes of scant hemoptysis on 6/12. No increase in SOB. Denies CP or pleurodynia. Denies F/C/S, LE edema or calf tenderness. On warfarin for CAF. PT last checked approx 3 wks ago and has been in therapeutic range consistently. Denies overt epistaxis but thinks blood might have come from nose.  04/14/11- 32 yoF former smoker  Newly referred to [facility] by  [facility] with complaint of trouble breathing when exercising. Husband is here. Over the last year she's been more aware of dyspnea with exertion. She is comfortable at rest. The sensation has been constant without any original event recognized, variation from day-to-day or progression. This is noticed mainly if she is walking up an incline, taking stairs quickly without stopping or otherwise exercising a little harder than usual ADLs. She was seen here in June as noted above will followup of her chronic cough problems which had been present for years. Cough has been mostly dry with occasional wheeze. Heavy dust exposure may be a trigger. Atrial fibrillation diagnosed about 2007, no pacemaker/Dr. Elease Hashimoto. Denies anemia.  Denies history of pneumonia or TB exposure. PPD negative in the past. She worked in the past/[place] with possibility of histoplasma exposure but no rebound liver is. No diagnosis of sarcoid. Soc- Married housewife, quit cigs 1992. CXR- 04/14/11- images reviewed: IMPRESSION:  No acute cardiopulmonary process. Central vascular congestion  without overt edema.  Evidence of prior granulomatous disease.  Original Report Authenticated By: Harrel Lemon, M.D.   05/12/11- 37 yoF former smoker followed for dyspnea on exertion, complicated by hx lung nodule, GERD, AFib,  HBP, TIA. Little cough or phlegm. Still gets short of breath with exertion. Tolerated eye surgery. PFT: 04/20/2011-mild obstructive airways disease with insignificant response to bronchodilator, air trapping, diffusion mildly reduced. FEV1/FVC 0.63, DLCO 76%. 6 minute walk test-97%, 96%, 98%, 513 m. Well-maintained oxygenation.  06/26/11- 87 yoF former smoker followed for dyspnea on exertion, complicated by hx lung nodule, GERD, AFib, HBP, TIA. Acute visit-cough-productive at times-yellow; coughed up blood on Friday-not a lot. Husband here. Tudorza sample made her cough worse.  We called in an antibiotic which is helping acute bronchitis. Producing less phlegm. Codeine cough syrup is some help.  ROS-see HPI Constitutional:   No-   weight loss, night sweats, fevers, chills, fatigue, lassitude.Thin. HEENT:   No-  headaches, difficulty swallowing, tooth/dental problems, sore throat,       No-  sneezing, itching, ear ache, nasal congestion, post nasal drip,  CV:  No-   chest pain, orthopnea, PND, swelling in lower extremities, anasarca, dizziness, palpitations Resp: + shortness of breath with exertion or at rest.              No-   productive cough,  + non-productive cough,  No- coughing up of blood.              No-   change in color of mucus.  + wheezing.   Skin: No-   rash or lesions. GI:  No-   heartburn, indigestion, abdominal pain, nausea, vomiting,  GU: No-   dysuria,  MS:  No-   joint pain or swelling. No- back pain. Neuro-     nothing unusual Psych:  No- change in mood  or affect. No depression or anxiety.  No memory loss.  OBJ- Physical Exam General- Alert, Oriented, Affect-appropriate, Distress- none acute, thin Skin- rash-none, lesions- none, excoriation- none Lymphadenopathy- none Head- atraumatic            Eyes- Gross vision intact, PERRLA, conjunctivae and secretions clear            Ears- Hearing, canals-normal            Nose- Clear, no-Septal dev, mucus, polyps, erosion,  perforation             Throat- Mallampati II , mucosa clear , drainage- none, tonsils- atrophic Neck- flexible , trachea midline, no stridor , thyroid nl, carotid no bruit Chest - symmetrical excursion , unlabored           Heart/CV- RRR , no murmur , no gallop  , no rub, nl s1 s2                           - JVD- none , edema- none, stasis changes- none, varices- none           Lung- clear to P&A, unlabored, +coarse wheeze, cough- none , dullness-none, rub- none. No cough noted.           Chest wall-  Abd-  Br/ Gen/ Rectal- Not done, not indicated Extrem- cyanosis- none, clubbing, none, atrophy- none, strength- nl Neuro- grossly intact to observation

## 2011-06-26 NOTE — Patient Instructions (Addendum)
Sample and script Dulera 100     2 puffs and rinse twice daily every day  Script for benzonatate to try for cough

## 2011-07-01 DIAGNOSIS — J449 Chronic obstructive pulmonary disease, unspecified: Secondary | ICD-10-CM | POA: Insufficient documentation

## 2011-07-01 NOTE — Assessment & Plan Note (Signed)
Plan-sample Dulera 100, benzonatate for cough

## 2011-07-04 ENCOUNTER — Ambulatory Visit (INDEPENDENT_AMBULATORY_CARE_PROVIDER_SITE_OTHER): Payer: Medicare Other | Admitting: *Deleted

## 2011-07-04 DIAGNOSIS — I4891 Unspecified atrial fibrillation: Secondary | ICD-10-CM

## 2011-07-14 ENCOUNTER — Other Ambulatory Visit: Payer: Self-pay | Admitting: Cardiovascular Disease

## 2011-08-01 ENCOUNTER — Ambulatory Visit (INDEPENDENT_AMBULATORY_CARE_PROVIDER_SITE_OTHER): Payer: Medicare Other | Admitting: *Deleted

## 2011-08-01 DIAGNOSIS — I4891 Unspecified atrial fibrillation: Secondary | ICD-10-CM

## 2011-08-01 LAB — POCT INR: INR: 2.3

## 2011-08-05 ENCOUNTER — Other Ambulatory Visit: Payer: Self-pay | Admitting: Cardiovascular Disease

## 2011-08-10 ENCOUNTER — Ambulatory Visit (INDEPENDENT_AMBULATORY_CARE_PROVIDER_SITE_OTHER): Payer: Medicare Other | Admitting: Internal Medicine

## 2011-08-10 ENCOUNTER — Encounter: Payer: Self-pay | Admitting: Internal Medicine

## 2011-08-10 VITALS — BP 114/64 | HR 77 | Ht 67.25 in | Wt 142.4 lb

## 2011-08-10 DIAGNOSIS — J45909 Unspecified asthma, uncomplicated: Secondary | ICD-10-CM

## 2011-08-10 NOTE — Patient Instructions (Signed)
Please call as needed 

## 2011-08-10 NOTE — Progress Notes (Signed)
08/24/10- Dr Eula Listen of Dr [facility] for chronic cough and PNDS. Has also been evaluated for a RUL nodule in past which is now deemed to be benign. Seen as add-on 6/13 with persistent to worsening cough, particularly in the past couple of weeks. Had 2 episodes of scant hemoptysis on 6/12. No increase in SOB. Denies CP or pleurodynia. Denies F/C/S, LE edema or calf tenderness. On warfarin for CAF. PT last checked approx 3 wks ago and has been in therapeutic range consistently. Denies overt epistaxis but thinks blood might have come from nose.  04/14/11- 40 yoF former smoker  Newly referred to [facility] by  [facility] with complaint of trouble breathing when exercising. Husband is here. Over the last year she's been more aware of dyspnea with exertion. She is comfortable at rest. The sensation has been constant without any original event recognized, variation from day-to-day or progression. This is noticed mainly if she is walking up an incline, taking stairs quickly without stopping or otherwise exercising a little harder than usual ADLs. She was seen here in June as noted above will followup of her chronic cough problems which had been present for years. Cough has been mostly dry with occasional wheeze. Heavy dust exposure may be a trigger. Atrial fibrillation diagnosed about 2007, no pacemaker/Dr. Elease Hashimoto. Denies anemia.  Denies history of pneumonia or TB exposure. PPD negative in the past. She worked in the past/[place] with possibility of histoplasma exposure but no rebound liver is. No diagnosis of sarcoid. Soc- Married housewife, quit cigs 1992. CXR- 04/14/11- images reviewed: IMPRESSION:  No acute cardiopulmonary process. Central vascular congestion  without overt edema.  Evidence of prior granulomatous disease.  Original Report Authenticated By: Harrel Lemon, M.D.   05/12/11- 85 yoF former smoker followed for dyspnea on exertion, complicated by hx lung nodule, GERD, AFib,  HBP, TIA. Little cough or phlegm. Still gets short of breath with exertion. Tolerated eye surgery. PFT: 04/20/2011-mild obstructive airways disease with insignificant response to bronchodilator, air trapping, diffusion mildly reduced. FEV1/FVC 0.63, DLCO 76%. 6 minute walk test-97%, 96%, 98%, 513 m. Well-maintained oxygenation.  06/26/11- 63 yoF former smoker followed for dyspnea on exertion, complicated by hx lung nodule, GERD, AFib, HBP, TIA. Acute visit-cough-productive at times-yellow; coughed up blood on Friday-not a lot. Husband here. Tudorza sample made her cough worse.  We called in an antibiotic which is helping acute bronchitis. Producing less phlegm. Codeine cough syrup is some help.  08/10/11- 31 yoF former smoker followed for dyspnea on exertion, complicated by hx lung nodule, GERD, AFib, HBP, TIA.    Dr Cyndia Bent PCP    Husband here Reports doing much better. Used up sample [facility]- says no longer needed. Benzonatate caused hallucination. We discussed CXR as reviewed w/ her- suggested vascular congestion, so improvement may have started with that. She no longer feels palpitation and denies orthopnea or swelling.   CXR 04/28/11 IMPRESSION:  No acute cardiopulmonary process. Central vascular congestion  without overt edema.  Evidence of prior granulomatous disease.  Original Report Authenticated By: Harrel Lemon, M.D.   ROS-see HPI Constitutional:   No-   weight loss, night sweats, fevers, chills, fatigue, lassitude.Thin. HEENT:   No-  headaches, difficulty swallowing, tooth/dental problems, sore throat,       No-  sneezing, itching, ear ache, nasal congestion, post nasal drip,  CV:  No-   chest pain, orthopnea, PND, swelling in lower extremities, anasarca, dizziness, palpitations Resp: No acute shortness of breath with exertion  or at rest.              No-   productive cough,   non-productive cough,  No- coughing up of blood.              No-   change in color of mucus,   wheezing.   Skin: No-   rash or lesions. GI:  No-   heartburn, indigestion, abdominal pain, nausea, vomiting,  GU:   MS:  No-   joint pain or swelling. No- back pain. Neuro-     nothing unusual Psych:  No- change in mood or affect. No depression or anxiety.  No memory loss.  OBJ- Physical Exam General- Alert, Oriented, Affect-appropriate, Distress- none acute, thin Skin- rash-none, lesions- none, excoriation- none Lymphadenopathy- none Head- atraumatic            Eyes- Gross vision intact, PERRLA, conjunctivae and secretions clear            Ears- Hearing, canals-normal            Nose- Clear, no-Septal dev, mucus, polyps, erosion, perforation             Throat- Mallampati II , mucosa clear , drainage- none, tonsils- atrophic Neck- flexible , trachea midline, no stridor , thyroid nl, carotid no bruit Chest - symmetrical excursion , unlabored           Heart/CV- almost regular pulse , no murmur , no gallop  , no rub, nl s1 s2                           - JVD- none , edema- none, stasis changes- none, varices- none           Lung- clear to P&A, unlabored, No- wheeze, cough- none , dullness-none, rub- none. No cough noted.           Chest wall-  Abd-  Br/ Gen/ Rectal- Not done, not indicated Extrem- cyanosis- none, clubbing, none, atrophy- none, strength- nl Neuro- grossly intact to observation

## 2011-08-16 NOTE — Assessment & Plan Note (Signed)
Much improved. There may have also been improvement in fluid overload since that was suggested on last CXR.

## 2011-08-29 ENCOUNTER — Ambulatory Visit (INDEPENDENT_AMBULATORY_CARE_PROVIDER_SITE_OTHER): Payer: Medicare Other

## 2011-08-29 DIAGNOSIS — I4891 Unspecified atrial fibrillation: Secondary | ICD-10-CM

## 2011-08-30 ENCOUNTER — Encounter: Payer: Self-pay | Admitting: Cardiovascular Disease

## 2011-09-04 ENCOUNTER — Other Ambulatory Visit: Payer: Self-pay | Admitting: Cardiovascular Disease

## 2011-09-07 ENCOUNTER — Encounter: Payer: Self-pay | Admitting: Cardiovascular Disease

## 2011-09-07 ENCOUNTER — Ambulatory Visit (INDEPENDENT_AMBULATORY_CARE_PROVIDER_SITE_OTHER): Payer: Medicare Other | Admitting: Cardiovascular Disease

## 2011-09-07 VITALS — BP 129/71 | HR 54 | Ht 67.5 in | Wt 142.2 lb

## 2011-09-07 DIAGNOSIS — I4891 Unspecified atrial fibrillation: Secondary | ICD-10-CM

## 2011-09-07 DIAGNOSIS — I10 Essential (primary) hypertension: Secondary | ICD-10-CM

## 2011-09-07 MED ORDER — OLMESARTAN MEDOXOMIL 20 MG PO TABS: 20.0000 mg | ORAL_TABLET | Freq: Every day | ORAL | Status: DC

## 2011-09-07 NOTE — Patient Instructions (Addendum)
Your physician wants you to follow-up in: 6 months You will receive a reminder letter in the mail two months in advance. If you don't receive a letter, please call our office to schedule the follow-up appointment.   Your physician has recommended you make the following change in your medication:   Reduce benicar to 20 mg once daily.

## 2011-09-07 NOTE — Assessment & Plan Note (Signed)
She remains in atrial fibrillation. She complains of lots of bruising. Her INR levels have been in the therapeutic range although she has had some 2.9 levels.  We had long discussion regarding continuation of Coumadin versus starting Xarelto.  After long discussion she will continue with the Coumadin. I'll see her again in 6 months.

## 2011-09-07 NOTE — Progress Notes (Signed)
Natalie Ho Public Date of Birth  1930-10-29 [facility] [address]    [address] [place], [place]  [address] [phone]  Fax  [phone]  Problems: 1. Atrial fibrillation 2. Remote stroke 3. Aortic insufficiency 4. Hypertension 5. Hypercholesterolemia  History of Present Illness:  78 yo Hx of a-Fib, remote stroke, HTN, hypercholesterolemia.  Her last visit we added amlodipine 2.5 mg a day. She also started taking her Benicar 20 mg twice a day instead of 40 mg at once. She is feeling quite a bit better. Her blood pressure readings have stabilized. She still occasionally has some signs of mild hypertension with a systolic blood pressure 140.      She also has episodes where her blood pressure will become fairly low especially at night. They're given some nights when she has held her medication for several hours because her blood pressure was in the 90 range.  She's not been limited by chest pain or shortness of breath.  She has been having some problems with hypertension.  We added amlodipine 2.5 mg at her last visit.  Her blood pressures have been better.     Current Outpatient Prescriptions on File Prior to Visit  Medication Sig Dispense Refill  . amLODipine (NORVASC) 2.5 MG tablet take 1 tablet by mouth once daily  30 tablet  9  . aspirin 81 MG tablet Take 81 mg by mouth daily.        Marland Kitchen atorvastatin (LIPITOR) 20 MG tablet take 1 tablet by mouth at bedtime  30 tablet  12  . BYSTOLIC 10 MG tablet take 1 tablet by mouth once daily  30 tablet  9  . Calcium Citrate-Vitamin D (CITRACAL + D PO) Take 800 mg by mouth 2 (two) times daily. 4 tablets daily       . Cholecalciferol (VITAMIN D) 1000 UNITS capsule Take 1,000 Units by mouth daily.       . clopidogrel (PLAVIX) 75 MG tablet take 1 tablet by mouth once daily  90 tablet  2  . digoxin (LANOXIN) 0.125 MG tablet Take 125 mcg by mouth daily.      Marland Kitchen EPIPEN 2-PAK 0.3 MG/0.3ML DEVI       . olmesartan (BENICAR) 20 MG tablet Take 20  mg by mouth 2 (two) times daily.      Marland Kitchen omeprazole (PRILOSEC) 20 MG capsule Take 20 mg by mouth daily.        . sertraline (ZOLOFT) 50 MG tablet Take 50 mg by mouth daily.        Marland Kitchen warfarin (COUMADIN) 5 MG tablet Take 5-7.5 mg by mouth daily. Take 1(5mg )  tablet all days except Wednesday & Saturday take 1.5 tablets (7.5mg )        Allergies  Allergen Reactions  . Benzonatate Other (See Comments)    Halluccinations  . Shrimp (Shellfish Allergy) Anaphylaxis and Swelling  . Alendronate Sodium Other (See Comments)    Unknown reaction  . Dabigatran Etexilate Mesylate Other (See Comments)    Upset stomach  . Levofloxacin Other (See Comments)    "made her funny in the head"  . Sulfonamide Derivatives Other (See Comments)    Unknown reaction  . Tramadol Other (See Comments)    Unknown reaction    Past Medical History  Diagnosis Date  . Atrial fibrillation   . Stroke   . Aortic insufficiency   . Hypertension   . Hyperlipidemia   . SOB (shortness of breath)   . Headache   .  Gout   . GERD (gastroesophageal reflux disease)   . Colitis   . Melanoma of nose   . Arthritis   . Anxiety   . Chronic anticoagulation     No past surgical history on file.  History  Smoking status  . Former Smoker -- 0.5 packs/day for 25 years  . Types: Cigarettes  . Quit date: 03/13/1990  Smokeless tobacco  . Not on file    History  Alcohol Use  . 2.5 oz/week  . 5 drink(s) per week    No family history on file.  Reviw of Systems:  Reviewed in the HPI.  All other systems are negative.  Physical Exam: BP 129/71  Pulse 54  Ht 5' 7.5" (1.715 m)  Wt 142 lb 3.2 oz (64.501 kg)  BMI 21.94 kg/m2 The patient is alert and oriented x 3.  The mood and affect are normal.   Skin: warm and dry.  Color is normal.    HEENT:   the sclera are nonicteric.  The mucous membranes are moist.  The carotids are 2+ without bruits.  There is no thyromegaly.  There is no JVD.    Lungs: clear.  The chest wall is  non tender.    Heart: Irregularly irregular normal S1-S2.Marland Kitchen  There are no murmurs, gallops, or rubs. The PMI is not displaced.     Abdomen: good bowel sounds.  There is no guarding or rebound.  There is no hepatosplenomegaly or tenderness.  There are no masses.   Extremities:  no clubbing, cyanosis, or edema.  The legs are without rashes.  The distal pulses are intact.   Neuro:  Cranial nerves II - XII are intact.  Motor and sensory functions are intact.    The gait is normal.   Assessment / Plan:

## 2011-09-07 NOTE — Assessment & Plan Note (Signed)
Her blood pressure has been fairly normal. She has been holding her nightly dose of Benicar because she is concerned that her blood pressure too low. She brought her blood pressure log with her. Her blood pressures have been normal on just once a day Benicar.  Will continue with current dose of Benicar 20 mg a day and amlodipine 2.5 mg a day. I'll see her again in 6 months for followup visit.

## 2011-09-12 ENCOUNTER — Ambulatory Visit: Payer: Medicare Other | Admitting: Internal Medicine

## 2011-09-26 ENCOUNTER — Ambulatory Visit (INDEPENDENT_AMBULATORY_CARE_PROVIDER_SITE_OTHER): Payer: Medicare Other | Admitting: *Deleted

## 2011-09-26 DIAGNOSIS — I4891 Unspecified atrial fibrillation: Secondary | ICD-10-CM

## 2011-09-26 LAB — POCT INR: INR: 2.3

## 2011-10-04 ENCOUNTER — Other Ambulatory Visit: Payer: Self-pay | Admitting: Cardiovascular Disease

## 2011-10-04 NOTE — Telephone Encounter (Signed)
..   Requested Prescriptions   Pending Prescriptions Disp Refills  . digoxin (LANOXIN) 0.25 MG tablet [Pharmacy Med Name: DIGOXIN 250 MCG TABLET] 90 tablet 3    Sig: take 1 tablet by mouth once daily

## 2011-10-10 ENCOUNTER — Ambulatory Visit (INDEPENDENT_AMBULATORY_CARE_PROVIDER_SITE_OTHER): Payer: Medicare Other | Admitting: *Deleted

## 2011-10-10 DIAGNOSIS — I4891 Unspecified atrial fibrillation: Secondary | ICD-10-CM

## 2011-10-10 LAB — POCT INR: INR: 1.6

## 2011-10-24 ENCOUNTER — Ambulatory Visit (INDEPENDENT_AMBULATORY_CARE_PROVIDER_SITE_OTHER): Payer: Medicare Other | Admitting: Pharmacist

## 2011-10-24 DIAGNOSIS — I4891 Unspecified atrial fibrillation: Secondary | ICD-10-CM

## 2011-11-07 ENCOUNTER — Encounter (HOSPITAL_COMMUNITY): Payer: Self-pay

## 2011-11-07 ENCOUNTER — Emergency Department (INDEPENDENT_AMBULATORY_CARE_PROVIDER_SITE_OTHER): Payer: Medicare Other

## 2011-11-07 ENCOUNTER — Emergency Department (INDEPENDENT_AMBULATORY_CARE_PROVIDER_SITE_OTHER)
Admission: EM | Admit: 2011-11-07 | Discharge: 2011-11-07 | Disposition: A | Discharge status: Home / Self Care | Payer: Medicare Other | Source: Home / Self Care | Attending: Emergency Medicine | Admitting: Emergency Medicine

## 2011-11-07 DIAGNOSIS — S3992XA Unspecified injury of lower back, initial encounter: Secondary | ICD-10-CM

## 2011-11-07 DIAGNOSIS — W010XXA Fall on same level from slipping, tripping and stumbling without subsequent striking against object, initial encounter: Secondary | ICD-10-CM

## 2011-11-07 DIAGNOSIS — Z23 Encounter for immunization: Secondary | ICD-10-CM

## 2011-11-07 DIAGNOSIS — IMO0002 Reserved for concepts with insufficient information to code with codable children: Secondary | ICD-10-CM

## 2011-11-07 MED ORDER — TETANUS-DIPHTH-ACELL PERTUSSIS 5-2.5-18.5 LF-MCG/0.5 IM SUSP
0.5000 mL | Freq: Once | INTRAMUSCULAR | Status: AC
  Administered 2011-11-07: 0.5 mL via INTRAMUSCULAR

## 2011-11-07 MED ORDER — TETANUS-DIPHTH-ACELL PERTUSSIS 5-2.5-18.5 LF-MCG/0.5 IM SUSP
INTRAMUSCULAR | Status: AC
  Filled 2011-11-07: qty 0.5

## 2011-11-07 NOTE — ED Notes (Signed)
States she fell from roll around garden cart at home, and landed on her buttocks onto paved driveway. Denies LOC, but does have abrasion on right elbow; unsure last DT. Large swollen and ecchymotic area noted at base of tailbone (on coumadin)

## 2011-11-07 NOTE — ED Provider Notes (Signed)
History     CSN: [id number]  Arrival date & time 11/07/11  1540   First MD Initiated Contact with Patient 11/07/11 1815      Chief Complaint  Patient presents with  . Fall    (Consider location/radiation/quality/duration/timing/severity/associated sxs/prior treatment) The history is provided by the patient.  patient reports one week ago she was pulling weeds while sitting on a rolling chair, states upon attempting to stand she lost her balance and fell backwards on her buttocks and elbows.  Reports intermittent buttocks pain since then for which she has been taking Vicodin left over from a previous surgery.  States pain relieved with Vicodin and using donut pillow.  Noted pain to be worse with movement and sitting.  Reports history of osteopenia for which she takes calcium and vitamin d daily.   Past Medical History  Diagnosis Date  . Atrial fibrillation   . Stroke   . Aortic insufficiency   . Hypertension   . Hyperlipidemia   . SOB (shortness of breath)   . Headache   . Gout   . GERD (gastroesophageal reflux disease)   . Colitis   . Melanoma of nose   . Arthritis   . Anxiety   . Chronic anticoagulation     History reviewed. No pertinent past surgical history.  History reviewed. No pertinent family history.  History  Substance Use Topics  . Smoking status: Former Smoker -- 0.5 packs/day for 25 years    Types: Cigarettes    Quit date: 03/13/1990  . Smokeless tobacco: Not on file  . Alcohol Use: 2.5 oz/week    5 drink(s) per week    OB History    Grav Para Term Preterm Abortions TAB SAB Ect Mult Living                  Review of Systems  Constitutional: Negative.   Respiratory: Negative.   Cardiovascular: Negative.   Musculoskeletal: Positive for back pain. Negative for myalgias, joint swelling, arthralgias and gait problem.  Skin: Negative.   Neurological: Negative.     Allergies  Benzonatate; Shrimp; Alendronate sodium; Dabigatran etexilate mesylate;  Levofloxacin; Sulfonamide derivatives; and Tramadol  Home Medications   Current Outpatient Rx  Name Route Sig Dispense Refill  . AMLODIPINE BESYLATE 2.5 MG PO TABS  take 1 tablet by mouth once daily 30 tablet 9  . ASPIRIN 81 MG PO TABS Oral Take 81 mg by mouth daily.      . ATORVASTATIN CALCIUM 20 MG PO TABS  take 1 tablet by mouth at bedtime 30 tablet 12  . BYSTOLIC 10 MG PO TABS  take 1 tablet by mouth once daily 30 tablet 9  . CITRACAL + D PO Oral Take 800 mg by mouth 2 (two) times daily. 4 tablets daily     . VITAMIN D 1000 UNITS PO CAPS Oral Take 1,000 Units by mouth daily.     Marland Kitchen CLOPIDOGREL BISULFATE 75 MG PO TABS  take 1 tablet by mouth once daily 90 tablet 2  . DIGOXIN 0.125 MG PO TABS Oral Take 125 mcg by mouth daily.    Marland Kitchen DIGOXIN 0.25 MG PO TABS  take 1 tablet by mouth once daily 90 tablet 3    Refill Approved with Changes  . EPIPEN 2-PAK 0.3 MG/0.3ML IJ DEVI      . OLMESARTAN MEDOXOMIL 20 MG PO TABS Oral Take 1 tablet (20 mg total) by mouth daily. 30 tablet 6  . OMEPRAZOLE 20 MG PO CPDR  Oral Take 20 mg by mouth daily.      . SERTRALINE HCL 50 MG PO TABS Oral Take 50 mg by mouth daily.      . WARFARIN SODIUM 5 MG PO TABS Oral Take 5-7.5 mg by mouth daily. Take 1(5mg )  tablet all days except Wednesday & Saturday take 1.5 tablets (7.5mg )      BP 166/77  Pulse 65  Temp 98.7 F (37.1 C) (Oral)  Resp 16  SpO2 98%  Physical Exam  Nursing note and vitals reviewed. Constitutional: She is oriented to person, place, and time. Vital signs are normal. She appears well-developed and well-nourished. She is active and cooperative.  HENT:  Head: Normocephalic.  Eyes: Conjunctivae are normal. Pupils are equal, round, and reactive to light. No scleral icterus.  Neck: Trachea normal. Neck supple.  Cardiovascular: Normal rate and regular rhythm.   Pulmonary/Chest: Effort normal and breath sounds normal.  Musculoskeletal:       Lumbar back: Normal.       Back:       Point tenderness  at sacrum/coccyx, no brusing noted.  Neurological: She is alert and oriented to person, place, and time. She has normal strength. No cranial nerve deficit or sensory deficit. Coordination and gait normal. GCS eye subscore is 4. GCS verbal subscore is 5. GCS motor subscore is 6.       Sensation intact distally  Skin: Skin is warm and dry.  Psychiatric: She has a normal mood and affect. Her speech is normal and behavior is normal. Judgment and thought content normal. Cognition and memory are normal.    ED Course  Procedures (including critical care time)  Labs Reviewed - No data to display Dg Sacrum/coccyx  11/07/2011  *RADIOLOGY REPORT*  Clinical Data: Fall.  Coccygeal pain.  SACRUM AND COCCYX - 2+ VIEW  Comparison: None.  Findings: Gas and stool in the rectum obscures the coccyx on the frontal projections.  No obvious discontinuity of the sacral arcuate lines noted.  Aside from the normal irregularities at the lower sacral and coccygeal segment transitions, no cortical irregularity is observed to suggest fracture.  IMPRESSION:  1.  No fracture identified.  If there is a high clinical index of suspicion of occult fracture, MRI provides greater sensitivity.   Original Report Authenticated By: Dellia Cloud, M.D.      1. Tailbone injury   2. Fall due to stumbling       MDM  Continue Vicodin for pain as needed.  Follow up with your primary care provider if symptoms persist or worsen as discussed.        Johnsie Kindred, NP 11/07/11 2129

## 2011-11-07 NOTE — ED Provider Notes (Signed)
Medical screening examination/treatment/procedure(s) were performed by non-physician practitioner and as supervising physician I was immediately available for consultation/collaboration.  Leslee Home, M.D.   Reuben Likes, MD 11/07/11 [phone]

## 2011-11-14 ENCOUNTER — Ambulatory Visit (INDEPENDENT_AMBULATORY_CARE_PROVIDER_SITE_OTHER): Payer: Medicare Other | Admitting: Pharmacist

## 2011-11-14 DIAGNOSIS — I4891 Unspecified atrial fibrillation: Secondary | ICD-10-CM

## 2011-12-12 ENCOUNTER — Ambulatory Visit (INDEPENDENT_AMBULATORY_CARE_PROVIDER_SITE_OTHER): Payer: Medicare Other | Admitting: *Deleted

## 2011-12-12 DIAGNOSIS — I4891 Unspecified atrial fibrillation: Secondary | ICD-10-CM

## 2011-12-14 DIAGNOSIS — D0330 Melanoma in situ of unspecified part of face: Secondary | ICD-10-CM | POA: Insufficient documentation

## 2011-12-14 DIAGNOSIS — K219 Gastro-esophageal reflux disease without esophagitis: Secondary | ICD-10-CM | POA: Insufficient documentation

## 2011-12-14 DIAGNOSIS — I351 Nonrheumatic aortic (valve) insufficiency: Secondary | ICD-10-CM | POA: Insufficient documentation

## 2011-12-14 DIAGNOSIS — Z9229 Personal history of other drug therapy: Secondary | ICD-10-CM | POA: Insufficient documentation

## 2011-12-14 DIAGNOSIS — Z8673 Personal history of transient ischemic attack (TIA), and cerebral infarction without residual deficits: Secondary | ICD-10-CM | POA: Insufficient documentation

## 2011-12-25 ENCOUNTER — Telehealth: Payer: Self-pay | Admitting: *Deleted

## 2011-12-25 NOTE — Telephone Encounter (Signed)
Spoke with pt.  She is aware she will need Lovenox bridge for the procedure.  Her surgery is not scheduled until January.  Will set up Lovenox bridge once we are closer to her surgery date.

## 2011-12-25 NOTE — Telephone Encounter (Signed)
Received request for cardiac clearance for left hip /THA, pt will needs lovenox bridging, I will forward to coumadin. Cardiac clearance given, faxed by medical records to [facility].

## 2012-01-23 ENCOUNTER — Ambulatory Visit (INDEPENDENT_AMBULATORY_CARE_PROVIDER_SITE_OTHER): Payer: Medicare Other | Admitting: Pharmacist

## 2012-01-23 DIAGNOSIS — I4891 Unspecified atrial fibrillation: Secondary | ICD-10-CM

## 2012-02-05 ENCOUNTER — Encounter: Payer: Self-pay | Admitting: Internal Medicine

## 2012-02-05 ENCOUNTER — Ambulatory Visit (INDEPENDENT_AMBULATORY_CARE_PROVIDER_SITE_OTHER): Payer: Medicare Other | Admitting: Internal Medicine

## 2012-02-05 VITALS — BP 120/68 | HR 66 | Ht 65.0 in | Wt 140.8 lb

## 2012-02-05 DIAGNOSIS — J45909 Unspecified asthma, uncomplicated: Secondary | ICD-10-CM

## 2012-02-05 DIAGNOSIS — R059 Cough, unspecified: Secondary | ICD-10-CM

## 2012-02-05 DIAGNOSIS — R05 Cough: Secondary | ICD-10-CM

## 2012-02-05 NOTE — Progress Notes (Signed)
08/24/10- Dr Duaine Dredge of Dr [facility] for chronic cough and PNDS. Has also been evaluated for a RUL nodule in past which is now deemed to be benign. Seen as add-on 6/13 with persistent to worsening cough, particularly in the past couple of weeks. Had 2 episodes of scant hemoptysis on 6/12. No increase in SOB. Denies CP or pleurodynia. Denies F/C/S, LE edema or calf tenderness. On warfarin for CAF. PT last checked approx 3 wks ago and has been in therapeutic range consistently. Denies overt epistaxis but thinks blood might have come from nose.  04/14/11- 30 yoF former smoker  Newly referred to [facility] by  [facility] with complaint of trouble breathing when exercising. Husband is here. Over the last year she's been more aware of dyspnea with exertion. She is comfortable at rest. The sensation has been constant without any original event recognized, variation from day-to-day or progression. This is noticed mainly if she is walking up an incline, taking stairs quickly without stopping or otherwise exercising a little harder than usual ADLs. She was seen here in June as noted above will followup of her chronic cough problems which had been present for years. Cough has been mostly dry with occasional wheeze. Heavy dust exposure may be a trigger. Atrial fibrillation diagnosed about 2007, no pacemaker/Dr. Acie Fredrickson. Denies anemia.  Denies history of pneumonia or TB exposure. PPD negative in the past. She worked in the past/[place] with possibility of histoplasma exposure but no rebound liver is. No diagnosis of sarcoid. Soc- Married housewife, quit cigs 1992. CXR- 04/14/11- images reviewed: IMPRESSION:  No acute cardiopulmonary process. Central vascular congestion  without overt edema.  Evidence of prior granulomatous disease.  Original Report Authenticated By: Arline Asp, M.D.   05/12/11- 20 yoF former smoker followed for dyspnea on exertion, complicated by hx lung nodule, GERD, AFib,  HBP, TIA. Little cough or phlegm. Still gets short of breath with exertion. Tolerated eye surgery. PFT: 04/20/2011-mild obstructive airways disease with insignificant response to bronchodilator, air trapping, diffusion mildly reduced. FEV1/FVC 0.63, DLCO 76%. 6 minute walk test-97%, 96%, 98%, 513 m. Well-maintained oxygenation.  06/26/11- 54 yoF former smoker followed for dyspnea on exertion, complicated by hx lung nodule, GERD, AFib, HBP, TIA. Acute visit-cough-productive at times-yellow; coughed up blood on Friday-not a lot. Husband here. Tudorza sample made her cough worse.  We called in an antibiotic which is helping acute bronchitis. Producing less phlegm. Codeine cough syrup is some help.  08/10/11- 89 yoF former smoker followed for dyspnea on exertion, complicated by hx lung nodule, GERD, AFib, HBP, TIA.    Dr Melford Aase PCP    Husband here Reports doing much better. Used up sample [facility]- says no longer needed. Benzonatate caused hallucination. We discussed CXR as reviewed w/ her- suggested vascular congestion, so improvement may have started with that. She no longer feels palpitation and denies orthopnea or swelling.   CXR 04/28/11 IMPRESSION:  No acute cardiopulmonary process. Central vascular congestion  without overt edema.  Evidence of prior granulomatous disease.  Original Report Authenticated By: Arline Asp, M.D.    02/05/12- 5 yoF former smoker followed for dyspnea on exertion, asthma/ bronchitis, complicated by hx lung nodule, GERD, AFib, HBP, TIA.    Dr Melford Aase PCP    Husband here Follows For:SOB about the same - Occas non prod cough - Occas wheezing - Denies chest tightness Cough most days is nonproductive. Some dyspnea on exertion, not worse. Plans hip replacement in January and the arthritis limits  her activity. Denies chest pain, palpitation, purulent or bloody sputum.  ROS-see HPI Constitutional:   No-   weight loss, night sweats, fevers, chills, fatigue,  lassitude.Thin. HEENT:   No-  headaches, difficulty swallowing, tooth/dental problems, sore throat,       No-  sneezing, itching, ear ache, nasal congestion, post nasal drip,  CV:  No-   chest pain, orthopnea, PND, swelling in lower extremities, anasarca, dizziness, palpitations Resp: No acute shortness of breath with exertion or at rest.              No-   productive cough,   +non-productive cough,  No- coughing up of blood.              No-   change in color of mucus,  +wheezing.   Skin: No-   rash or lesions. GI:  No-   heartburn, indigestion, abdominal pain, nausea, vomiting,  GU:   MS:  No-   joint pain or swelling. No- back pain. Neuro-     nothing unusual Psych:  No- change in mood or affect. No depression or anxiety.  No memory loss.  OBJ- Physical Exam General- Alert, Oriented, Affect-appropriate, Distress- none acute, thin Skin- rash-none, lesions- none, excoriation- none Lymphadenopathy- none Head- atraumatic            Eyes- Gross vision intact, PERRLA, conjunctivae and secretions clear            Ears- Hearing, canals-normal            Nose- Clear, no-Septal dev, mucus, polyps, erosion, perforation             Throat- Mallampati II , mucosa clear , drainage- none, tonsils- atrophic Neck- flexible , trachea midline, no stridor , thyroid nl, carotid no bruit Chest - symmetrical excursion , unlabored           Heart/CV- almost regular pulse , no murmur , no gallop  , no rub, nl s1 s2                           - JVD- none , edema- none, stasis changes- none, varices- none           Lung- clear to P&A, unlabored, No- wheeze, cough- none , dullness-none, rub- none. No cough noted.           Chest wall-  Abd-  Br/ Gen/ Rectal- Not done, not indicated Extrem- cyanosis- none, clubbing, none, atrophy- none, strength- nl Neuro- grossly intact to observation

## 2012-02-05 NOTE — Patient Instructions (Addendum)
Please call if I can help 

## 2012-02-07 ENCOUNTER — Other Ambulatory Visit: Payer: Self-pay | Admitting: Cardiovascular Disease

## 2012-02-07 MED ORDER — WARFARIN SODIUM 5 MG PO TABS: 5.0000 mg | ORAL_TABLET | ORAL | Status: DC

## 2012-02-18 NOTE — Assessment & Plan Note (Signed)
Mild obstructive airways disease with mild cough. These should not interfere with her planned hip surgery.

## 2012-02-18 NOTE — Assessment & Plan Note (Signed)
No specific cause identified after dedicated workup of this is considered idiopathic. She says she can tolerate it. It is no worse.

## 2012-02-27 ENCOUNTER — Ambulatory Visit (INDEPENDENT_AMBULATORY_CARE_PROVIDER_SITE_OTHER): Payer: Medicare Other | Admitting: *Deleted

## 2012-02-27 DIAGNOSIS — I4891 Unspecified atrial fibrillation: Secondary | ICD-10-CM

## 2012-02-27 LAB — POCT INR: INR: 3

## 2012-02-27 LAB — BASIC METABOLIC PANEL
CO2: 29 mEq/L (ref 19–32)
Chloride: 98 mEq/L (ref 96–112)
Potassium: 4.5 mEq/L (ref 3.5–5.1)
Sodium: 133 mEq/L — ABNORMAL LOW (ref 135–145)

## 2012-02-27 NOTE — Patient Instructions (Addendum)
03/28/12- Last dose of coumadin 03/29/12- Do Nothing  03/30/12- Start Lovenox injection 100mg s subcutaneously every 24 hours once daily at 8am  03/31/12- Continue Lovenox injection 100mg s subcutaneously every 24 hours once daily at 8am  04/01/12- Continue Lovenox injection  100mg s subcutaneously every 24 hours once daily at 8am  04/02/12- Take last  Lovenox injection 100mg s at 8am.  04/03/12- Day of Surgery ----No Lovenox  Call after Hospital Discharge to [phone] to coumadin clinic

## 2012-02-28 MED ORDER — ENOXAPARIN SODIUM 100 MG/ML ~~LOC~~ SOLN: 100.0000 mg | Freq: Every day | SUBCUTANEOUS | Status: DC

## 2012-03-04 ENCOUNTER — Other Ambulatory Visit: Payer: Self-pay | Admitting: Orthopedic Surgery

## 2012-03-04 MED ORDER — DEXAMETHASONE SODIUM PHOSPHATE 10 MG/ML IJ SOLN: 10.0000 mg | Freq: Once | INTRAMUSCULAR | Status: DC

## 2012-03-04 MED ORDER — BUPIVACAINE LIPOSOME 1.3 % IJ SUSP: 20.0000 mL | Freq: Once | INTRAMUSCULAR | Status: DC

## 2012-03-04 NOTE — Progress Notes (Signed)
Preoperative surgical orders have been place into the Epic hospital system for Natalie Ho on 03/04/2012, 1:03 PM  by Patrica Duel for surgery on 04/03/2012.  Preop Total Hip - Anterior Approach orders including Experel Injecion, IV Tylenol, and IV Decadron as long as there are no contraindications to the above medications. Avel Peace, PA-C

## 2012-03-21 ENCOUNTER — Encounter (HOSPITAL_COMMUNITY): Payer: Self-pay | Admitting: Pharmacy Technician

## 2012-03-28 ENCOUNTER — Encounter (HOSPITAL_COMMUNITY)
Admission: RE | Admit: 2012-03-28 | Discharge: 2012-03-28 | Disposition: A | Discharge status: Home / Self Care | Payer: Medicare Other | Source: Ambulatory Visit | Attending: Orthopedic Surgery | Admitting: Orthopedic Surgery

## 2012-03-28 ENCOUNTER — Ambulatory Visit (HOSPITAL_COMMUNITY)
Admission: RE | Admit: 2012-03-28 | Discharge: 2012-03-28 | Disposition: A | Discharge status: Home / Self Care | Payer: Medicare Other | Source: Ambulatory Visit | Attending: Orthopedic Surgery | Admitting: Orthopedic Surgery

## 2012-03-28 ENCOUNTER — Encounter (HOSPITAL_COMMUNITY): Payer: Self-pay

## 2012-03-28 DIAGNOSIS — Z01812 Encounter for preprocedural laboratory examination: Secondary | ICD-10-CM | POA: Insufficient documentation

## 2012-03-28 DIAGNOSIS — M87059 Idiopathic aseptic necrosis of unspecified femur: Secondary | ICD-10-CM | POA: Insufficient documentation

## 2012-03-28 LAB — COMPREHENSIVE METABOLIC PANEL
Alkaline Phosphatase: 90 U/L (ref 39–117)
BUN: 16 mg/dL (ref 6–23)
Calcium: 9.3 mg/dL (ref 8.4–10.5)
GFR calc Af Amer: >90 mL/min (ref >90)
Glucose, Bld: 95 mg/dL (ref 70–99)
Total Protein: 7.3 g/dL (ref 6.0–8.3)

## 2012-03-28 LAB — URINALYSIS, ROUTINE W REFLEX MICROSCOPIC
Hgb urine dipstick: NEGATIVE
Protein, ur: NEGATIVE mg/dL
Urobilinogen, UA: 1 mg/dL (ref 0.0–1.0)

## 2012-03-28 LAB — URINE MICROSCOPIC-ADD ON

## 2012-03-28 LAB — CBC
HCT: 43 % (ref 36.0–46.0)
Hemoglobin: 14.6 g/dL (ref 12.0–15.0)
MCH: 30.2 pg (ref 26.0–34.0)
MCHC: 34 g/dL (ref 30.0–36.0)
MCV: 89 fL (ref 78.0–100.0)

## 2012-03-28 LAB — PROTIME-INR: Prothrombin Time: 18.7 seconds — ABNORMAL HIGH (ref 11.6–15.2)

## 2012-03-28 LAB — SURGICAL PCR SCREEN: Staphylococcus aureus: NEGATIVE

## 2012-03-28 NOTE — Pre-Procedure Instructions (Signed)
03-28-12 EKG(3'13)/ CXR(2'13) -reports in Epic. Left Hip XRay done today.

## 2012-03-28 NOTE — Patient Instructions (Addendum)
20 FRANCINE HANNAN  03/28/2012   Your procedure is scheduled on:   04-03-2012  Report to [facility] at       1230 PM.  Call this number if you have problems the morning of surgery: [phone]  Or Presurgical Testing [phone](Zeno Hickel)   .   Do not eat food:After Midnight.  May have clear liquids:up to 6 Hours before arrival. Nothing after : 0900 AM  Clear liquids include soda, tea, black coffee, apple or grape juice, broth.  Take these medicines the morning of surgery with A SIP OF WATER: Digoxin. Bystolic. Prilosec. Zoloft.   Do not wear jewelry, make-up or nail polish.  Do not wear lotions, powders, or perfumes. You may wear deodorant.  Do not shave 12 hours prior to first CHG shower(legs and under arms).(face and neck okay.)  Do not bring valuables to the hospital.  Contacts, dentures or bridgework,body piercing,  may not be worn into surgery.  Leave suitcase in the car. After surgery it may be brought to your room.  For patients admitted to the hospital, checkout time is 11:00 AM the day of discharge.   Patients discharged the day of surgery will not be allowed to drive home. Must have responsible person with you x 24 hours once discharged.  Name and phone number of your driver: Zollie Beckers, [phone](EXBM full number)  Special Instructions: CHG Shower Use Special Wash: see special instructions.(avoid face and genitals)   Please read over the following fact sheets that you were given: MRSA Information, Blood Transfusion fact sheet, Incentive Spirometry Instruction.    Failure to follow these instructions may result in Cancellation of your surgery.   Patient signature_______________________________________________________

## 2012-03-31 ENCOUNTER — Emergency Department (HOSPITAL_COMMUNITY)
Admission: EM | Admit: 2012-03-31 | Discharge: 2012-03-31 | Disposition: A | Discharge status: Home / Self Care | Payer: Medicare Other | Attending: Emergency Medicine | Admitting: Emergency Medicine

## 2012-03-31 ENCOUNTER — Emergency Department (HOSPITAL_COMMUNITY): Payer: Medicare Other

## 2012-03-31 ENCOUNTER — Encounter (HOSPITAL_COMMUNITY): Payer: Self-pay | Admitting: Emergency Medicine

## 2012-03-31 DIAGNOSIS — Z8719 Personal history of other diseases of the digestive system: Secondary | ICD-10-CM | POA: Insufficient documentation

## 2012-03-31 DIAGNOSIS — S8001XA Contusion of right knee, initial encounter: Secondary | ICD-10-CM

## 2012-03-31 DIAGNOSIS — Z872 Personal history of diseases of the skin and subcutaneous tissue: Secondary | ICD-10-CM | POA: Insufficient documentation

## 2012-03-31 DIAGNOSIS — Y929 Unspecified place or not applicable: Secondary | ICD-10-CM | POA: Insufficient documentation

## 2012-03-31 DIAGNOSIS — F411 Generalized anxiety disorder: Secondary | ICD-10-CM | POA: Insufficient documentation

## 2012-03-31 DIAGNOSIS — I10 Essential (primary) hypertension: Secondary | ICD-10-CM | POA: Insufficient documentation

## 2012-03-31 DIAGNOSIS — K219 Gastro-esophageal reflux disease without esophagitis: Secondary | ICD-10-CM | POA: Insufficient documentation

## 2012-03-31 DIAGNOSIS — Z8639 Personal history of other endocrine, nutritional and metabolic disease: Secondary | ICD-10-CM | POA: Insufficient documentation

## 2012-03-31 DIAGNOSIS — Z7901 Long term (current) use of anticoagulants: Secondary | ICD-10-CM | POA: Insufficient documentation

## 2012-03-31 DIAGNOSIS — Z79899 Other long term (current) drug therapy: Secondary | ICD-10-CM | POA: Insufficient documentation

## 2012-03-31 DIAGNOSIS — Z8673 Personal history of transient ischemic attack (TIA), and cerebral infarction without residual deficits: Secondary | ICD-10-CM | POA: Insufficient documentation

## 2012-03-31 DIAGNOSIS — Z8739 Personal history of other diseases of the musculoskeletal system and connective tissue: Secondary | ICD-10-CM | POA: Insufficient documentation

## 2012-03-31 DIAGNOSIS — Z862 Personal history of diseases of the blood and blood-forming organs and certain disorders involving the immune mechanism: Secondary | ICD-10-CM | POA: Insufficient documentation

## 2012-03-31 DIAGNOSIS — I4891 Unspecified atrial fibrillation: Secondary | ICD-10-CM | POA: Insufficient documentation

## 2012-03-31 DIAGNOSIS — Z87891 Personal history of nicotine dependence: Secondary | ICD-10-CM | POA: Insufficient documentation

## 2012-03-31 DIAGNOSIS — S8000XA Contusion of unspecified knee, initial encounter: Secondary | ICD-10-CM | POA: Insufficient documentation

## 2012-03-31 DIAGNOSIS — Y939 Activity, unspecified: Secondary | ICD-10-CM | POA: Insufficient documentation

## 2012-03-31 DIAGNOSIS — E785 Hyperlipidemia, unspecified: Secondary | ICD-10-CM | POA: Insufficient documentation

## 2012-03-31 DIAGNOSIS — Z8679 Personal history of other diseases of the circulatory system: Secondary | ICD-10-CM | POA: Insufficient documentation

## 2012-03-31 DIAGNOSIS — S0990XA Unspecified injury of head, initial encounter: Secondary | ICD-10-CM | POA: Insufficient documentation

## 2012-03-31 NOTE — ED Notes (Addendum)
Pt presenting to ed with c/o swelling to her right knee s/p falling x 4 days ago pt states swelling is getting worse. Pt is on lovenox and was recently taken off coumadin and plavix. Pt states she is scheduled for hip replacement 1/22. Pt states she is barely able to walk on leg.

## 2012-03-31 NOTE — ED Notes (Signed)
Patient transported to X-ray 

## 2012-03-31 NOTE — ED Provider Notes (Signed)
History     CSN: [id number]  Arrival date & time 03/31/12  0850   First MD Initiated Contact with Patient 03/31/12 [phone]      Chief Complaint  Patient presents with  . swelling to knee    (Consider location/radiation/quality/duration/timing/severity/associated sxs/prior treatment) HPI Comments: Natalie Ho is a 79 y.o. Female who injured her right knee 3 days ago, when she tripped on a rock falling onto her right knee. She also hit her head. There was no loss of consciousness. She's not having nausea, or vomiting, weakness, dizziness, paresthesias, blurred vision, abdominal or chest pain. Right knee hurts to palpation, and with walking. She has ongoing, left hip pain, and is planning, a left hip replacement in 3 days. She is tolerating her medications at home. There are no other modifying factors..  The history is provided by the patient.    Past Medical History  Diagnosis Date  . Atrial fibrillation   . Aortic insufficiency   . Hypertension   . Hyperlipidemia   . SOB (shortness of breath)   . Headache   . Gout   . GERD (gastroesophageal reflux disease)   . Colitis   . Arthritis   . Anxiety   . Chronic anticoagulation   . Stroke     TIA-3'08, none recent  . Melanoma of nose     "was only a precancer area"-no problems since    Past Surgical History  Procedure Date  . Cataracts     bilateral  . Fracture surgery     ORIF-Lt. hip -,removed hardware  . Eye surgeries     Multiple eye surgeries for torned retina-bilateral  . Cardioversion     Unsuccessful  . Blepharoplasty     bilateral  . Colon polyps     removed via colonoscopy  . Tonsillectomy     No family history on file.  History  Substance Use Topics  . Smoking status: Former Smoker -- 0.5 packs/day for 25 years    Types: Cigarettes    Quit date: 03/13/1990  . Smokeless tobacco: Not on file  . Alcohol Use: 2.5 oz/week    5 drink(s) per week     Comment: wine    OB History    Grav Para Term  Preterm Abortions TAB SAB Ect Mult Living                  Review of Systems  All other systems reviewed and are negative.    Allergies  Benzonatate; Shrimp; Alendronate sodium; Dabigatran etexilate mesylate; Levofloxacin; Sulfonamide derivatives; and Tramadol  Home Medications   Current Outpatient Rx  Name  Route  Sig  Dispense  Refill  . AMLODIPINE BESYLATE 2.5 MG PO TABS   Oral   Take 2.5 mg by mouth every evening.         . ATORVASTATIN CALCIUM 20 MG PO TABS   Oral   Take 20 mg by mouth at bedtime.         Marland Kitchen DIGOXIN 0.125 MG PO TABS   Oral   Take 125 mcg by mouth every morning.          Marland Kitchen ENOXAPARIN SODIUM 100 MG/ML [place] SOLN   Subcutaneous   Inject 1 mL (100 mg total) into the skin daily.   10 Syringe   1   . NEBIVOLOL HCL 10 MG PO TABS   Oral   Take 10 mg by mouth every morning.         Marland Kitchen  NYSTATIN-TRIAMCINOLONE 100000-0.1 UNIT/GM-% EX CREA   Topical   Apply 1 application topically daily as needed. On fungus         . OMEPRAZOLE 20 MG PO CPDR   Oral   Take 20 mg by mouth every morning.          Marland Kitchen SERTRALINE HCL 50 MG PO TABS   Oral   Take 50 mg by mouth every morning.          Marland Kitchen EPIPEN 2-PAK 0.3 MG/0.3ML IJ DEVI   Intramuscular   Inject 0.3 mg into the muscle as needed. As needed for allergic reaction.           BP 126/65  Pulse 74  Temp 98.1 F (36.7 C) (Oral)  Resp 18  SpO2 96%  Physical Exam  Nursing note and vitals reviewed. Constitutional: She is oriented to person, place, and time. She appears well-developed and well-nourished.  HENT:  Head: Normocephalic and atraumatic.  Eyes: Conjunctivae normal and EOM are normal. Pupils are equal, round, and reactive to light.  Neck: Normal range of motion and phonation normal. Neck supple.  Cardiovascular: Normal rate and intact distal pulses.   Pulmonary/Chest: Effort normal.  Musculoskeletal:       Right knee, tender, and swollen, anteriorly, with ecchymosis. She is able to  extend the right leg, by lifting it off the stretcher to 30. She can flex the right knee about 45. The right knee is grossly stable  Neurological: She is alert and oriented to person, place, and time. She has normal strength. She exhibits normal muscle tone.  Skin: Skin is warm and dry.  Psychiatric: She has a normal mood and affect. Her behavior is normal. Judgment and thought content normal.    ED Course  Procedures (including critical care time)  Consultation: Case discussed with Dr. Thomasena Edis, on call for orthopedics. He will make arrangements for patient to see her orthopedist, in 2 days  Right knee, treated- Ace wrap and knee immobilizer     Labs Reviewed - No data to display Dg Knee Complete 4 Views Right  03/31/2012  *RADIOLOGY REPORT*  Clinical Data: Fall 1 week ago.  Pain for 2 days.  History multiple prior injuries.  RIGHT KNEE - COMPLETE 4+ VIEW  Comparison: None.  Findings: Moderate prepatellar soft tissue swelling. No acute fracture or dislocation.  No joint effusion.  Vascular calcifications.  Joint spaces maintained for age.  IMPRESSION: Prepatellar soft tissue swelling, without acute osseous abnormality.   Original Report Authenticated By: Jeronimo Greaves, M.D.    Nursing notes, applicable records and vitals reviewed.  Radiologic Images/Reports reviewed.   1. Contusion of right knee       MDM  Right knee contusion, without fracture, or apparent intra-articular injury. Doubt serious head injury. No other apparent extremity injury. Her fall, appears to have been mechanical.Doubt metabolic instability, serious bacterial infection or impending vascular collapse; the patient is stable for discharge.     Plan: Home Medications- usual; Home Treatments- knee immobilizer, elevation; Recommended follow up- orthopedics, two days.     Flint Melter, MD 03/31/12 1037

## 2012-04-02 ENCOUNTER — Other Ambulatory Visit: Payer: Self-pay | Admitting: Orthopedic Surgery

## 2012-04-02 NOTE — H&P (Signed)
Natalie Ho  DOB: 08-08-1930 Separated / Language: [place] / Race: White Female  Date of Admission:  04/03/2012  Chief Complaint:  Left Hip Pain  History of Present Illness The patient is a 79 year old female who comes in for a preoperative History and Physical. The patient is scheduled for a left total hip arthroplasty to be performed by Dr. Gus Rankin. Aluisio, MD at [facility] on 04/03/2012. The patient is a 79 year old female presenting for a post-operative visit. The patient comes in over 10 years out from left in situ pinning of the left femoral neck fracture (and 9 years s/p hardware removal). The patient states that she is getting worse at this time. She has had pain since the first week of September. She reports a fall in August. Mrs. Nash had a fall 10 plus years ago and had to have an in situ pinning of a nondisplace left femoral neck fracture. She later had the hardware, cannulated screws, removed withour difficulty. She was able to rehab and did well following the hardware removal for many years up until August of this year. She, unfortunately, sustained another fall landing on her back side. She was seen at [facility] and was told there werer no fractures, but the husband said that they only x-ray the back side of her pelvis and not the hip. She eventually had an MRI ordered and was told that she had "edema" in the bone and was told to follow up with an orthopedist. She comes in today stating that her pain in the hip started up about a week after the fall and has been there ever since. It has not improved. She denies any pain at rest but she has immediate pain once she puts her foot on the floor. She is unable to stand for any length of time. She also has night pain and cannot get comfortable. She denies numbness or tingling in that leg. She denies popping, buckling, or weakness. Its mainly pain. She was not having pain prior to this episode  within the past month. She had the MRI scan performed showing significant femoral head and neck edema and the area of collapse. It is felt she would benefit from undergoing a left total hip replacement and the patient is ready to proceed. They have been treated conservatively in the past for the above stated problem and despite conservative measures, they continue to have progressive pain and severe functional limitations and dysfunction. They have failed non-operative management including home exercise, medications. It is felt that they would benefit from undergoing total joint replacement. Risks and benefits of the procedure have been discussed with the patient and they elect to proceed with surgery. There are no active contraindications to surgery such as ongoing infection or rapidly progressive neurological disease.   Problem List Fx closed femur, base of neck (820.03) Complication NEC due to oth int orth device (996.78)   Allergies Pradaxa *ANTICOAGULANTS*. Upset Stomach Levofloxacin *FLUOROQUINOLONES*. Head feels funny TraMADol HCl *ANALGESICS - OPIOID*. Reaction Unknown Sulfanilamide *CHEMICALS*. Reaction Unknown Alendronate Sodium *ENDOCRINE AND METABOLIC AGENTS - MISC.*. Reaction Unknown Shellfish. Anaphylaxis, Swelling.   Family History Cancer. sister Drug / Alcohol Addiction. brother Rheumatoid Arthritis. mother Father. Deceased, Heart disease. age 79 Mother. Deceased, Heart disease, Arthritis. age 54   Social History Marital status. married Living situation. live with spouse Pain Contract. no Number of flights of stairs before winded. 2-3 Drug/Alcohol Rehab (Previously). no Drug/Alcohol Rehab (Currently). no Illicit drug use. no Exercise.  Exercises never Tobacco / smoke exposure. no Tobacco use. former smoker Current work status. retired Copywriter, advertising. 2 Alcohol use. current drinker; drinks wine; 5-7 per week Post-Surgical Plans. Plan is to  go home. Advance Directives. Living Will   Medication History AmLODIPine Besylate (2.5MG  Tablet, Oral) Active. Aspirin EC (81MG  Tablet DR, Oral) Active. Atorvastatin Calcium (20MG  Tablet, Oral) Active. Citracal Petites/Vitamin D ( Oral) Specific dose unknown - Active. Cholecalciferol (1000UNIT Capsule, Oral) Active. Clopidogrel Bisulfate (75MG  Tablet, Oral) Active. Digoxin (0.25MG  Tablet, Oral) Active. EPINEPHrine ( Injection) Specific dose unknown - Active. Bystolic (10MG  Tablet, Oral) Active. PriLOSEC (20MG  Capsule DR, Oral) Active. Sertraline HCl (50MG  Tablet, Oral) Active. Warfarin Sodium (5MG  Tablet, Oral) Active.   Past Surgical History Tonsillectomy Cataract Surgery. bilateral Arthroscopy of Shoulder. left Hip Fracture and Surgery. left Colon Polyp Removal - Colonoscopy   Medical History Osteoarthritis Hypercholesterolemia Bleeding disorder Cerebrovascular Accident Atrial Fibrillation Valvular heart disease. Aortic Insufficiency Hypertension Gout Gastroesophageal Reflux Disease Colitis (558.9) Anxiety Disorder Tinnitus Hemorrhoids   Review of Systems General:Not Present- Chills, Fever, Night Sweats, Fatigue, Weight Gain, Weight Loss and Memory Loss. Skin:Not Present- Hives, Itching, Rash, Eczema and Lesions. HEENT:Not Present- Tinnitus, Headache, Double Vision, Visual Loss, Hearing Loss and Dentures. Respiratory:Not Present- Shortness of breath with exertion, Shortness of breath at rest, Allergies, Coughing up blood and Chronic Cough. Cardiovascular:Not Present- Chest Pain, Racing/skipping heartbeats, Difficulty Breathing Lying Down, Murmur, Swelling and Palpitations. Gastrointestinal:Not Present- Bloody Stool, Heartburn, Abdominal Pain, Vomiting, Nausea, Constipation, Diarrhea, Difficulty Swallowing, Jaundice and Loss of appetitie. Female Genitourinary:Not Present- Blood in Urine, Urinary frequency, Weak urinary stream, Discharge, Flank Pain,  Incontinence, Painful Urination, Urgency, Urinary Retention and Urinating at Night. Musculoskeletal:Present- Joint Pain. Not Present- Muscle Weakness, Muscle Pain, Joint Swelling, Back Pain, Morning Stiffness and Spasms. Neurological:Not Present- Tremor, Dizziness, Blackout spells, Paralysis, Difficulty with balance and Weakness. Psychiatric:Not Present- Insomnia.   Vitals Weight: 142 lb Height: 65 in Weight was reported by patient. Height was reported by patient. Body Surface Area: 1.72 m Body Mass Index: 23.63 kg/m Pulse: 64 (Regular) Resp.: 12 (Unlabored) BP: 108/62 (Sitting, Right Arm, Standard)    Physical Exam The physical exam findings are as follows:  Note: Patient is a 79 year old female with continued hip pain. Patient is accompanied today by her husband.   General Mental Status - Alert, cooperative and good historian. General Appearance- pleasant. Not in acute distress. Orientation- Oriented X3. Build & Nutrition- Well nourished and Well developed.   Head and Neck Head- normocephalic, atraumatic . Neck Global Assessment- supple. no bruit auscultated on the right and no bruit auscultated on the left.   Eye Vision- Wears corrective lenses. Pupil- Bilateral- Regular and Round. Motion- Bilateral- EOMI.   Chest and Lung Exam Auscultation: Breath sounds:- clear at anterior chest wall and - clear at posterior chest wall. Adventitious sounds:- No Adventitious sounds.   Cardiovascular Auscultation:Rhythm- Irregularly irregular (Known history of atrial fib). Heart Sounds- S1 WNL and S2 WNL. Murmurs & Other Heart Sounds:Auscultation of the heart reveals - No Murmurs.   Abdomen Palpation/Percussion:Tenderness- Abdomen is non-tender to palpation. Rigidity (guarding)- Abdomen is soft. Auscultation:Auscultation of the abdomen reveals - Bowel sounds normal.   Female Genitourinary Not done, not pertinent to present  illness  Musculoskeletal She is a well developed female. She is alert and oriented. No apparent distress. The right hip has a normal range of motion, no discomfort. The left hip shows flexion to 95, rotation in 10, out 20 and abduction 20 with discomfort. She has some tenderness around the  trochanter. The right knee shows swelling in the prepatella bursal area consistent with a hematoma (from a fall). She has full painfree PROM of the right knee at the time of her H&P. Knee is stable and extensor mechanism is intact. Only minimally tender abour the patella.  RADIOGRAPHS: Plain films, AP pelvis, AP and lateral of the hip do show significant joint space narrowing and a small area of sclerosis and collapse of the femoral head. The right hip looks normal.  MRI is reviewed and does show a lot of edema in the head and neck.  Assessment & Plan Complication NEC due to oth int orth device (996.78) Fx closed femur, base of neck (820.03) Avascular Necrosis Left Hip Note: Plan is for a Left Total Hip Replacement - Anterior Approach by Dr. Lequita Halt.  Plan is to go home following the hospitalization.  PCP - Dr. Dimas Chyle Cardiology - Dr. Katherina Right  Signed electronically by Roberts Gaudy, PA-C

## 2012-04-03 ENCOUNTER — Encounter (HOSPITAL_COMMUNITY): Payer: Self-pay | Admitting: Anesthesiology

## 2012-04-03 ENCOUNTER — Encounter (HOSPITAL_COMMUNITY)
Admission: RE | Disposition: A | Discharge status: Home Health | Payer: Self-pay | Source: Ambulatory Visit | Attending: Orthopedic Surgery

## 2012-04-03 ENCOUNTER — Inpatient Hospital Stay (HOSPITAL_COMMUNITY): Payer: Medicare Other

## 2012-04-03 ENCOUNTER — Encounter (HOSPITAL_COMMUNITY): Payer: Self-pay

## 2012-04-03 ENCOUNTER — Inpatient Hospital Stay (HOSPITAL_COMMUNITY): Payer: Medicare Other | Admitting: Anesthesiology

## 2012-04-03 ENCOUNTER — Encounter (HOSPITAL_COMMUNITY): Payer: Self-pay | Admitting: *Deleted

## 2012-04-03 ENCOUNTER — Inpatient Hospital Stay (HOSPITAL_COMMUNITY)
Admission: RE | Admit: 2012-04-03 | Discharge: 2012-04-05 | DRG: 470 | Disposition: A | Discharge status: Home Health | Payer: Medicare Other | Source: Ambulatory Visit | Attending: Orthopedic Surgery | Admitting: Orthopedic Surgery

## 2012-04-03 DIAGNOSIS — M1610 Unilateral primary osteoarthritis, unspecified hip: Principal | ICD-10-CM | POA: Diagnosis present

## 2012-04-03 DIAGNOSIS — I4891 Unspecified atrial fibrillation: Secondary | ICD-10-CM | POA: Diagnosis present

## 2012-04-03 DIAGNOSIS — E871 Hypo-osmolality and hyponatremia: Secondary | ICD-10-CM | POA: Diagnosis not present

## 2012-04-03 DIAGNOSIS — I10 Essential (primary) hypertension: Secondary | ICD-10-CM | POA: Diagnosis present

## 2012-04-03 DIAGNOSIS — I359 Nonrheumatic aortic valve disorder, unspecified: Secondary | ICD-10-CM | POA: Diagnosis present

## 2012-04-03 DIAGNOSIS — M169 Osteoarthritis of hip, unspecified: Secondary | ICD-10-CM | POA: Diagnosis present

## 2012-04-03 DIAGNOSIS — Z79899 Other long term (current) drug therapy: Secondary | ICD-10-CM

## 2012-04-03 DIAGNOSIS — E7800 Pure hypercholesterolemia, unspecified: Secondary | ICD-10-CM | POA: Diagnosis present

## 2012-04-03 DIAGNOSIS — M109 Gout, unspecified: Secondary | ICD-10-CM | POA: Diagnosis present

## 2012-04-03 DIAGNOSIS — Z96649 Presence of unspecified artificial hip joint: Secondary | ICD-10-CM

## 2012-04-03 DIAGNOSIS — Z8673 Personal history of transient ischemic attack (TIA), and cerebral infarction without residual deficits: Secondary | ICD-10-CM

## 2012-04-03 DIAGNOSIS — D62 Acute posthemorrhagic anemia: Secondary | ICD-10-CM

## 2012-04-03 DIAGNOSIS — F411 Generalized anxiety disorder: Secondary | ICD-10-CM | POA: Diagnosis present

## 2012-04-03 DIAGNOSIS — Z7901 Long term (current) use of anticoagulants: Secondary | ICD-10-CM

## 2012-04-03 HISTORY — PX: TOTAL HIP ARTHROPLASTY: SHX124

## 2012-04-03 LAB — PROTIME-INR
INR: 1.03 (ref 0.00–1.49)
Prothrombin Time: 13.4 seconds (ref 11.6–15.2)

## 2012-04-03 LAB — TYPE AND SCREEN: Antibody Screen: NEGATIVE

## 2012-04-03 SURGERY — ARTHROPLASTY, HIP, TOTAL, ANTERIOR APPROACH
Anesthesia: Spinal | Site: Hip | Laterality: Left | Wound class: Clean

## 2012-04-03 MED ORDER — DIPHENHYDRAMINE HCL 12.5 MG/5ML PO ELIX: 12.5000 mg | ORAL_SOLUTION | ORAL | Status: DC | PRN

## 2012-04-03 MED ORDER — ACETAMINOPHEN 10 MG/ML IV SOLN
1000.0000 mg | Freq: Four times a day (QID) | INTRAVENOUS | Status: AC
  Administered 2012-04-03 – 2012-04-04 (×4): 1000 mg via INTRAVENOUS
  Filled 2012-04-03 (×6): qty 100

## 2012-04-03 MED ORDER — CEFAZOLIN SODIUM 1-5 GM-% IV SOLN
1.0000 g | Freq: Four times a day (QID) | INTRAVENOUS | Status: AC
  Administered 2012-04-03 – 2012-04-04 (×2): 1 g via INTRAVENOUS
  Filled 2012-04-03 (×2): qty 50

## 2012-04-03 MED ORDER — ACETAMINOPHEN 10 MG/ML IV SOLN: 1000.0000 mg | Freq: Once | INTRAVENOUS | Status: DC | PRN

## 2012-04-03 MED ORDER — DIGOXIN 125 MCG PO TABS
125.0000 ug | ORAL_TABLET | Freq: Every morning | ORAL | Status: DC
  Administered 2012-04-04 – 2012-04-05 (×2): 125 ug via ORAL
  Filled 2012-04-03 (×2): qty 1

## 2012-04-03 MED ORDER — SODIUM CHLORIDE 0.9 % IV SOLN: INTRAVENOUS | Status: DC

## 2012-04-03 MED ORDER — DEXTROSE-NACL 5-0.9 % IV SOLN
INTRAVENOUS | Status: DC
  Administered 2012-04-03 – 2012-04-04 (×2): via INTRAVENOUS

## 2012-04-03 MED ORDER — PROPOFOL INFUSION 10 MG/ML OPTIME
INTRAVENOUS | Status: DC | PRN
  Administered 2012-04-03 (×3): 20 mL via INTRAVENOUS

## 2012-04-03 MED ORDER — 0.9 % SODIUM CHLORIDE (POUR BTL) OPTIME
TOPICAL | Status: DC | PRN
  Administered 2012-04-03: 1000 mL

## 2012-04-03 MED ORDER — METOCLOPRAMIDE HCL 5 MG/ML IJ SOLN: 5.0000 mg | Freq: Three times a day (TID) | INTRAMUSCULAR | Status: DC | PRN

## 2012-04-03 MED ORDER — BISACODYL 10 MG RE SUPP: 10.0000 mg | Freq: Every day | RECTAL | Status: DC | PRN

## 2012-04-03 MED ORDER — PROPOFOL INFUSION 10 MG/ML OPTIME
INTRAVENOUS | Status: DC | PRN
  Administered 2012-04-03: 25 ug/kg/min via INTRAVENOUS

## 2012-04-03 MED ORDER — ACETAMINOPHEN 650 MG RE SUPP: 650.0000 mg | Freq: Four times a day (QID) | RECTAL | Status: DC | PRN

## 2012-04-03 MED ORDER — ONDANSETRON HCL 4 MG PO TABS: 4.0000 mg | ORAL_TABLET | Freq: Four times a day (QID) | ORAL | Status: DC | PRN

## 2012-04-03 MED ORDER — AMLODIPINE BESYLATE 2.5 MG PO TABS
2.5000 mg | ORAL_TABLET | Freq: Every evening | ORAL | Status: DC
  Administered 2012-04-03 – 2012-04-04 (×2): 2.5 mg via ORAL
  Filled 2012-04-03 (×3): qty 1

## 2012-04-03 MED ORDER — FENTANYL CITRATE 0.05 MG/ML IJ SOLN: 25.0000 ug | INTRAMUSCULAR | Status: DC | PRN

## 2012-04-03 MED ORDER — LACTATED RINGERS IV SOLN
INTRAVENOUS | Status: DC
  Administered 2012-04-03: 1000 mL via INTRAVENOUS
  Administered 2012-04-03: 16:00:00 via INTRAVENOUS

## 2012-04-03 MED ORDER — PHENOL 1.4 % MT LIQD: 1.0000 | OROMUCOSAL | Status: DC | PRN

## 2012-04-03 MED ORDER — FLEET ENEMA 7-19 GM/118ML RE ENEM: 1.0000 | ENEMA | Freq: Once | RECTAL | Status: AC | PRN

## 2012-04-03 MED ORDER — WARFARIN SODIUM 5 MG PO TABS
5.0000 mg | ORAL_TABLET | Freq: Once | ORAL | Status: AC
  Administered 2012-04-03: 5 mg via ORAL
  Filled 2012-04-03: qty 1

## 2012-04-03 MED ORDER — BUPIVACAINE LIPOSOME 1.3 % IJ SUSP
20.0000 mL | INTRAMUSCULAR | Status: AC
  Administered 2012-04-03: 20 mL
  Filled 2012-04-03: qty 20

## 2012-04-03 MED ORDER — NEBIVOLOL HCL 10 MG PO TABS
10.0000 mg | ORAL_TABLET | Freq: Every morning | ORAL | Status: DC
  Administered 2012-04-04 – 2012-04-05 (×2): 10 mg via ORAL
  Filled 2012-04-03 (×2): qty 1

## 2012-04-03 MED ORDER — METOCLOPRAMIDE HCL 10 MG PO TABS: 5.0000 mg | ORAL_TABLET | Freq: Three times a day (TID) | ORAL | Status: DC | PRN

## 2012-04-03 MED ORDER — METHOCARBAMOL 500 MG PO TABS
500.0000 mg | ORAL_TABLET | Freq: Four times a day (QID) | ORAL | Status: DC | PRN
  Administered 2012-04-05 (×2): 500 mg via ORAL
  Filled 2012-04-03 (×2): qty 1

## 2012-04-03 MED ORDER — EPHEDRINE SULFATE 50 MG/ML IJ SOLN
INTRAMUSCULAR | Status: DC | PRN
  Administered 2012-04-03 (×4): 10 mg via INTRAVENOUS

## 2012-04-03 MED ORDER — CEFAZOLIN SODIUM-DEXTROSE 2-3 GM-% IV SOLR
2.0000 g | INTRAVENOUS | Status: AC
  Administered 2012-04-03: 2 g via INTRAVENOUS

## 2012-04-03 MED ORDER — ACETAMINOPHEN 10 MG/ML IV SOLN
1000.0000 mg | Freq: Once | INTRAVENOUS | Status: AC
  Administered 2012-04-03: 1000 mg via INTRAVENOUS

## 2012-04-03 MED ORDER — DOCUSATE SODIUM 100 MG PO CAPS
100.0000 mg | ORAL_CAPSULE | Freq: Two times a day (BID) | ORAL | Status: DC
  Administered 2012-04-03 – 2012-04-05 (×4): 100 mg via ORAL

## 2012-04-03 MED ORDER — MORPHINE SULFATE 2 MG/ML IJ SOLN: 1.0000 mg | INTRAMUSCULAR | Status: DC | PRN

## 2012-04-03 MED ORDER — SODIUM CHLORIDE 0.9 % IJ SOLN
INTRAMUSCULAR | Status: DC | PRN
  Administered 2012-04-03: 50 mL

## 2012-04-03 MED ORDER — POLYETHYLENE GLYCOL 3350 17 G PO PACK: 17.0000 g | PACK | Freq: Every day | ORAL | Status: DC | PRN

## 2012-04-03 MED ORDER — WARFARIN - PHARMACIST DOSING INPATIENT: Freq: Every day | Status: DC

## 2012-04-03 MED ORDER — DEXAMETHASONE SODIUM PHOSPHATE 10 MG/ML IJ SOLN: 10.0000 mg | Freq: Once | INTRAMUSCULAR | Status: AC

## 2012-04-03 MED ORDER — PANTOPRAZOLE SODIUM 40 MG PO TBEC
40.0000 mg | DELAYED_RELEASE_TABLET | Freq: Every day | ORAL | Status: DC
  Filled 2012-04-03: qty 1

## 2012-04-03 MED ORDER — FENTANYL CITRATE 0.05 MG/ML IJ SOLN
INTRAMUSCULAR | Status: DC | PRN
  Administered 2012-04-03 (×2): 50 ug via INTRAVENOUS

## 2012-04-03 MED ORDER — SERTRALINE HCL 50 MG PO TABS
50.0000 mg | ORAL_TABLET | Freq: Every morning | ORAL | Status: DC
  Administered 2012-04-04 – 2012-04-05 (×2): 50 mg via ORAL
  Filled 2012-04-03 (×2): qty 1

## 2012-04-03 MED ORDER — ATORVASTATIN CALCIUM 20 MG PO TABS
20.0000 mg | ORAL_TABLET | Freq: Every day | ORAL | Status: DC
  Administered 2012-04-03 – 2012-04-04 (×2): 20 mg via ORAL
  Filled 2012-04-03 (×3): qty 1

## 2012-04-03 MED ORDER — METHOCARBAMOL 100 MG/ML IJ SOLN
500.0000 mg | Freq: Four times a day (QID) | INTRAVENOUS | Status: DC | PRN
  Administered 2012-04-03: 500 mg via INTRAVENOUS
  Filled 2012-04-03 (×2): qty 5

## 2012-04-03 MED ORDER — PROMETHAZINE HCL 25 MG/ML IJ SOLN: 6.2500 mg | INTRAMUSCULAR | Status: DC | PRN

## 2012-04-03 MED ORDER — MEPERIDINE HCL 50 MG/ML IJ SOLN: 6.2500 mg | INTRAMUSCULAR | Status: DC | PRN

## 2012-04-03 MED ORDER — CHLORHEXIDINE GLUCONATE 4 % EX LIQD
60.0000 mL | Freq: Once | CUTANEOUS | Status: DC
Start: 2012-04-03 — End: 2012-04-03
  Filled 2012-04-03: qty 60

## 2012-04-03 MED ORDER — MENTHOL 3 MG MT LOZG: 1.0000 | LOZENGE | OROMUCOSAL | Status: DC | PRN

## 2012-04-03 MED ORDER — ONDANSETRON HCL 4 MG/2ML IJ SOLN
4.0000 mg | Freq: Four times a day (QID) | INTRAMUSCULAR | Status: DC | PRN
  Administered 2012-04-04 (×2): 4 mg via INTRAVENOUS
  Filled 2012-04-03 (×2): qty 2

## 2012-04-03 MED ORDER — OXYCODONE HCL 5 MG PO TABS
5.0000 mg | ORAL_TABLET | ORAL | Status: DC | PRN
  Administered 2012-04-03 (×2): 10 mg via ORAL
  Administered 2012-04-04 – 2012-04-05 (×4): 5 mg via ORAL
  Filled 2012-04-03: qty 1
  Filled 2012-04-03: qty 2
  Filled 2012-04-03 (×2): qty 1
  Filled 2012-04-03: qty 2
  Filled 2012-04-03: qty 1

## 2012-04-03 MED ORDER — DEXAMETHASONE 4 MG PO TABS
10.0000 mg | ORAL_TABLET | Freq: Once | ORAL | Status: AC
  Administered 2012-04-04: 10 mg via ORAL
  Filled 2012-04-03: qty 1

## 2012-04-03 MED ORDER — ACETAMINOPHEN 325 MG PO TABS: 650.0000 mg | ORAL_TABLET | Freq: Four times a day (QID) | ORAL | Status: DC | PRN

## 2012-04-03 MED ORDER — ENOXAPARIN SODIUM 40 MG/0.4ML ~~LOC~~ SOLN
40.0000 mg | SUBCUTANEOUS | Status: DC
  Administered 2012-04-04 – 2012-04-05 (×2): 40 mg via SUBCUTANEOUS
  Filled 2012-04-03 (×3): qty 0.4

## 2012-04-03 SURGICAL SUPPLY — 41 items
BAG SPEC THK2 15X12 ZIP CLS (MISCELLANEOUS) ×2
BAG ZIPLOCK 12X15 (MISCELLANEOUS) ×4 IMPLANT
BLADE SAW SGTL 18X1.27X75 (BLADE) ×2 IMPLANT
CLOTH BEACON ORANGE TIMEOUT ST (SAFETY) ×2 IMPLANT
DECANTER SPIKE VIAL GLASS SM (MISCELLANEOUS) ×2 IMPLANT
DRAPE C-ARM 42X72 X-RAY (DRAPES) ×2 IMPLANT
DRAPE STERI IOBAN 125X83 (DRAPES) ×2 IMPLANT
DRAPE U-SHAPE 47X51 STRL (DRAPES) ×6 IMPLANT
DRSG ADAPTIC 3X8 NADH LF (GAUZE/BANDAGES/DRESSINGS) ×2 IMPLANT
DRSG MEPILEX BORDER 4X4 (GAUZE/BANDAGES/DRESSINGS) ×1 IMPLANT
DRSG MEPILEX BORDER 4X8 (GAUZE/BANDAGES/DRESSINGS) ×2 IMPLANT
DURAPREP 26ML APPLICATOR (WOUND CARE) ×2 IMPLANT
ELECT BLADE 6.5 EXT (BLADE) ×2 IMPLANT
ELECT REM PT RETURN 9FT ADLT (ELECTROSURGICAL) ×2
ELECTRODE REM PT RTRN 9FT ADLT (ELECTROSURGICAL) ×1 IMPLANT
EVACUATOR 1/8 PVC DRAIN (DRAIN) IMPLANT
FACESHIELD LNG OPTICON STERILE (SAFETY) ×8 IMPLANT
GLOVE BIO SURGEON STRL SZ8 (GLOVE) ×4 IMPLANT
GLOVE BIOGEL PI IND STRL 8 (GLOVE) ×1 IMPLANT
GLOVE BIOGEL PI INDICATOR 8 (GLOVE) ×1
GLOVE ECLIPSE 8.0 STRL XLNG CF (GLOVE) ×2 IMPLANT
GOWN BRE IMP PREV XXLGXLNG (GOWN DISPOSABLE) ×4 IMPLANT
GOWN STRL NON-REIN LRG LVL3 (GOWN DISPOSABLE) ×2 IMPLANT
KIT BASIN OR (CUSTOM PROCEDURE TRAY) ×2 IMPLANT
NDL SAFETY ECLIPSE 18X1.5 (NEEDLE) ×1 IMPLANT
NEEDLE HYPO 18GX1.5 SHARP (NEEDLE) ×2
PACK TOTAL JOINT (CUSTOM PROCEDURE TRAY) ×2 IMPLANT
PADDING CAST COTTON 6X4 STRL (CAST SUPPLIES) ×2 IMPLANT
SPONGE GAUZE 4X4 12PLY (GAUZE/BANDAGES/DRESSINGS) ×2 IMPLANT
STRIP CLOSURE SKIN 1/2X4 (GAUZE/BANDAGES/DRESSINGS) ×2 IMPLANT
SUCTION FRAZIER 12FR DISP (SUCTIONS) ×2 IMPLANT
SUT ETHIBOND NAB CT1 #1 30IN (SUTURE) ×6 IMPLANT
SUT MNCRL AB 4-0 PS2 18 (SUTURE) ×2 IMPLANT
SUT VIC AB 1 CT1 27 (SUTURE) ×2
SUT VIC AB 1 CT1 27XBRD ANTBC (SUTURE) ×1 IMPLANT
SUT VIC AB 2-0 CT1 27 (SUTURE) ×4
SUT VIC AB 2-0 CT1 TAPERPNT 27 (SUTURE) ×2 IMPLANT
SUT VLOC 180 0 24IN GS25 (SUTURE) ×2 IMPLANT
SYR 50ML LL SCALE MARK (SYRINGE) ×2 IMPLANT
TOWEL OR 17X26 10 PK STRL BLUE (TOWEL DISPOSABLE) ×3 IMPLANT
TRAY FOLEY CATH 14FRSI W/METER (CATHETERS) ×2 IMPLANT

## 2012-04-03 NOTE — Anesthesia Preprocedure Evaluation (Addendum)
Anesthesia Evaluation  Patient identified by MRN, date of birth, ID band Patient awake    Reviewed: Allergy & Precautions, H&P , NPO status , Patient's Chart, lab work & pertinent test results  Airway Mallampati: II TM Distance: >3 FB Neck ROM: Limited    Dental  (+) Dental Advisory Given and Teeth Intact   Pulmonary shortness of breath and with exertion, asthma ,  breath sounds clear to auscultation  Pulmonary exam normal       Cardiovascular hypertension, Pt. on medications + dysrhythmias Atrial Fibrillation + Valvular Problems/Murmurs AI Rhythm:Irregular Rate:Normal     Neuro/Psych  Headaches, Anxiety TIA   GI/Hepatic Neg liver ROS, GERD-  Medicated,  Endo/Other  negative endocrine ROS  Renal/GU negative Renal ROS     Musculoskeletal  (+) Arthritis -, Osteoarthritis,    Abdominal   Peds  Hematology negative hematology ROS (+)   Anesthesia Other Findings   Reproductive/Obstetrics                       Anesthesia Physical Anesthesia Plan  ASA: III  Anesthesia Plan: Spinal   Post-op Pain Management:    Induction:   Airway Management Planned: Simple Face Mask  Additional Equipment:   Intra-op Plan:   Post-operative Plan:   Informed Consent: I have reviewed the patients History and Physical, chart, labs and discussed the procedure including the risks, benefits and alternatives for the proposed anesthesia with the patient or authorized representative who has indicated his/her understanding and acceptance.   Dental advisory given  Plan Discussed with: CRNA  Anesthesia Plan Comments: (INR 1.6 on 1/16 and pt has not taken coumadin.)     Anesthesia Quick Evaluation

## 2012-04-03 NOTE — Anesthesia Postprocedure Evaluation (Signed)
Anesthesia Post Note  Patient: Natalie Ho  Procedure(s) Performed: Procedure(s) (LRB): TOTAL HIP ARTHROPLASTY ANTERIOR APPROACH (Left)  Anesthesia type: Spinal  Patient location: PACU  Post pain: Pain level controlled  Post assessment: Post-op Vital signs reviewed  Last Vitals: BP 104/60  Pulse 62  Temp 37.2 C (Oral)  Resp 16  Ht 5\' 7"  (1.702 m)  Wt 141 lb (63.957 kg)  BMI 22.08 kg/m2  SpO2 97%  Post vital signs: Reviewed  Level of consciousness: sedated  Complications: No apparent anesthesia complications

## 2012-04-03 NOTE — Anesthesia Procedure Notes (Addendum)
Spinal  Patient location during procedure: OR Start time: 04/03/2012 1:09 PM End time: 04/03/2012 1:14 PM Staffing Anesthesiologist: Lewie Loron R Performed by: anesthesiologist  Preanesthetic Checklist Completed: patient identified, site marked, surgical consent, pre-op evaluation, timeout performed, IV checked, risks and benefits discussed and monitors and equipment checked Spinal Block Patient position: sitting Prep: ChloraPrep Patient monitoring: heart rate, continuous pulse ox and blood pressure Location: L2-3 Injection technique: single-shot Needle Needle type: Quincke  Needle gauge: 22 G Needle length: 9 cm Assessment Sensory level: T8 Additional Notes Expiration date of kit checked and confirmed. Patient tolerated procedure well, without complications.

## 2012-04-03 NOTE — Interval H&P Note (Signed)
History and Physical Interval Note:  04/03/2012 12:51 PM  Natalie Ho  has presented today for surgery, with the diagnosis of avascular necrosis left hip   The various methods of treatment have been discussed with the patient and family. After consideration of risks, benefits and other options for treatment, the patient has consented to  Procedure(s) (LRB) with comments: TOTAL HIP ARTHROPLASTY ANTERIOR APPROACH (Left) as a surgical intervention .  The patient's history has been reviewed, patient examined, no change in status, stable for surgery.  I have reviewed the patient's chart and labs.  Questions were answered to the patient's satisfaction.     Loanne Drilling

## 2012-04-03 NOTE — Progress Notes (Signed)
ANTICOAGULATION CONSULT NOTE - Initial Consult  Pharmacy Consult for Warfarin Indication: VTE prophylaxis  Allergies  Allergen Reactions  . Benzonatate Other (See Comments)    Halluccinations  . Shrimp (Shellfish Allergy) Anaphylaxis and Swelling  . Alendronate Sodium Other (See Comments)    Unknown reaction  . Dabigatran Etexilate Mesylate Other (See Comments)    Upset stomach"Pradaxa"  . Levofloxacin Other (See Comments)    "made her funny in the head"  . Sulfonamide Derivatives Other (See Comments)    Unknown reaction  . Tramadol Other (See Comments)    Unknown reaction    Patient Measurements: Height: 5\' 7"  (170.2 cm) Weight: 141 lb (63.957 kg) IBW/kg (Calculated) : 61.6  Heparin Dosing Weight:   Vital Signs: Temp: 97.4 F (36.3 C) (01/22 1627) Temp src: Oral (01/22 1627) BP: 118/71 mmHg (01/22 1627) Pulse Rate: 57  (01/22 1627)  Labs:  Basename 04/03/12 1155  HGB --  HCT --  PLT --  APTT --  LABPROT 13.4  INR 1.03  HEPARINUNFRC --  CREATININE --  CKTOTAL --  CKMB --  TROPONINI --    Estimated Creatinine Clearance: 55.5 ml/min (by C-G formula based on Cr of 0.72).   Medical History: Past Medical History  Diagnosis Date  . Atrial fibrillation   . Aortic insufficiency   . Hypertension   . Hyperlipidemia   . SOB (shortness of breath)   . Headache   . Gout   . GERD (gastroesophageal reflux disease)   . Colitis   . Arthritis   . Anxiety   . Chronic anticoagulation   . Stroke     TIA-3'08, none recent  . Melanoma of nose     "was only a precancer area"-no problems since    Medications:  Scheduled:    . [COMPLETED] acetaminophen  1,000 mg Intravenous Once  . acetaminophen  1,000 mg Intravenous Q6H  . amLODipine  2.5 mg Oral QPM  . atorvastatin  20 mg Oral QHS  . [COMPLETED] bupivacaine liposome  20 mL Infiltration To OR  .  ceFAZolin (ANCEF) IV  1 g Intravenous Q6H  . [COMPLETED]  ceFAZolin (ANCEF) IV  2 g Intravenous 60 min Pre-Op  .  dexamethasone  10 mg Oral Once   Or  . dexamethasone  10 mg Intravenous Once  . digoxin  125 mcg Oral q morning - 10a  . docusate sodium  100 mg Oral BID  . enoxaparin (LOVENOX) injection  40 mg Subcutaneous Q24H  . nebivolol  10 mg Oral q morning - 10a  . pantoprazole  40 mg Oral Daily  . sertraline  50 mg Oral q morning - 10a  . [DISCONTINUED] chlorhexidine  60 mL Topical Once   Infusions:    . dextrose 5 % and 0.9% NaCl    . [DISCONTINUED] sodium chloride    . [DISCONTINUED] lactated ringers 100 mL/hr at 04/03/12 1530   PRN: acetaminophen, acetaminophen, bisacodyl, diphenhydrAMINE, menthol-cetylpyridinium, methocarbamol (ROBAXIN) IV, methocarbamol, metoCLOPramide (REGLAN) injection, metoCLOPramide, morphine injection, ondansetron (ZOFRAN) IV, ondansetron, oxyCODONE, phenol, polyethylene glycol, sodium phosphate, [DISCONTINUED] 0.9 % irrigation (POUR BTL), [DISCONTINUED] acetaminophen, [DISCONTINUED] fentaNYL, [DISCONTINUED] meperidine (DEMEROL) injection [DISCONTINUED] promethazine, [DISCONTINUED] sodium chloride  Assessment: 79 yo F S/P Left total hip arthroplasty Goal of Therapy:  INR 2-3 Discontinue Lovenox when INR > 1.8 Monitor platelets by anticoagulation protocol: Yes   Plan:  1. Warfarin 5mg  po x 1 2. PT/INR daily in AM 3. CBC daily  Loletta Specter 04/03/2012,5:00 PM

## 2012-04-03 NOTE — H&P (View-Only) (Signed)
Natalie Ho  DOB: 10/24/1930 Separated / Language: English / Race: White Female  Date of Admission:  04/03/2012  Chief Complaint:  Left Hip Pain  History of Present Illness The patient is a 79 year old female who comes in for a preoperative History and Physical. The patient is scheduled for a left total hip arthroplasty to be performed by Dr. Frank V. Aluisio, MD at [facility] on 04/03/2012. The patient is a 79 year old female presenting for a post-operative visit. The patient comes in over 10 years out from left in situ pinning of the left femoral neck fracture (and 9 years s/p hardware removal). The patient states that she is getting worse at this time. She has had pain since the first week of September. She reports a fall in August. Natalie Ho had a fall 10 plus years ago and had to have an in situ pinning of a nondisplace left femoral neck fracture. She later had the hardware, cannulated screws, removed withour difficulty. She was able to rehab and did well following the hardware removal for many years up until August of this year. She, unfortunately, sustained another fall landing on her back side. She was seen at [facility] and was told there werer no fractures, but the husband said that they only x-ray the back side of her pelvis and not the hip. She eventually had an MRI ordered and was told that she had "edema" in the bone and was told to follow up with an orthopedist. She comes in today stating that her pain in the hip started up about a week after the fall and has been there ever since. It has not improved. She denies any pain at rest but she has immediate pain once she puts her foot on the floor. She is unable to stand for any length of time. She also has night pain and cannot get comfortable. She denies numbness or tingling in that leg. She denies popping, buckling, or weakness. Its mainly pain. She was not having pain prior to this episode  within the past month. She had the MRI scan performed showing significant femoral head and neck edema and the area of collapse. It is felt she would benefit from undergoing a left total hip replacement and the patient is ready to proceed. They have been treated conservatively in the past for the above stated problem and despite conservative measures, they continue to have progressive pain and severe functional limitations and dysfunction. They have failed non-operative management including home exercise, medications. It is felt that they would benefit from undergoing total joint replacement. Risks and benefits of the procedure have been discussed with the patient and they elect to proceed with surgery. There are no active contraindications to surgery such as ongoing infection or rapidly progressive neurological disease.   Problem List Fx closed femur, base of neck (820.03) Complication NEC due to oth int orth device (996.78)   Allergies Pradaxa *ANTICOAGULANTS*. Upset Stomach Levofloxacin *FLUOROQUINOLONES*. Head feels funny TraMADol HCl *ANALGESICS - OPIOID*. Reaction Unknown Sulfanilamide *CHEMICALS*. Reaction Unknown Alendronate Sodium *ENDOCRINE AND METABOLIC AGENTS - MISC.*. Reaction Unknown Shellfish. Anaphylaxis, Swelling.   Family History Cancer. sister Drug / Alcohol Addiction. brother Rheumatoid Arthritis. mother Father. Deceased, Heart disease. age 68 Mother. Deceased, Heart disease, Arthritis. age 65   Social History Marital status. married Living situation. live with spouse Pain Contract. no Number of flights of stairs before winded. 2-3 Drug/Alcohol Rehab (Previously). no Drug/Alcohol Rehab (Currently). no Illicit drug use. no Exercise.   Exercises never Tobacco / smoke exposure. no Tobacco use. former smoker Current work status. retired Children. 2 Alcohol use. current drinker; drinks wine; 5-7 per week Post-Surgical Plans. Plan is to  go home. Advance Directives. Living Will   Medication History AmLODIPine Besylate (2.5MG Tablet, Oral) Active. Aspirin EC (81MG Tablet DR, Oral) Active. Atorvastatin Calcium (20MG Tablet, Oral) Active. Citracal Petites/Vitamin D ( Oral) Specific dose unknown - Active. Cholecalciferol (1000UNIT Capsule, Oral) Active. Clopidogrel Bisulfate (75MG Tablet, Oral) Active. Digoxin (0.25MG Tablet, Oral) Active. EPINEPHrine ( Injection) Specific dose unknown - Active. Bystolic (10MG Tablet, Oral) Active. PriLOSEC (20MG Capsule DR, Oral) Active. Sertraline HCl (50MG Tablet, Oral) Active. Warfarin Sodium (5MG Tablet, Oral) Active.   Past Surgical History Tonsillectomy Cataract Surgery. bilateral Arthroscopy of Shoulder. left Hip Fracture and Surgery. left Colon Polyp Removal - Colonoscopy   Medical History Osteoarthritis Hypercholesterolemia Bleeding disorder Cerebrovascular Accident Atrial Fibrillation Valvular heart disease. Aortic Insufficiency Hypertension Gout Gastroesophageal Reflux Disease Colitis (558.9) Anxiety Disorder Tinnitus Hemorrhoids   Review of Systems General:Not Present- Chills, Fever, Night Sweats, Fatigue, Weight Gain, Weight Loss and Memory Loss. Skin:Not Present- Hives, Itching, Rash, Eczema and Lesions. HEENT:Not Present- Tinnitus, Headache, Double Vision, Visual Loss, Hearing Loss and Dentures. Respiratory:Not Present- Shortness of breath with exertion, Shortness of breath at rest, Allergies, Coughing up blood and Chronic Cough. Cardiovascular:Not Present- Chest Pain, Racing/skipping heartbeats, Difficulty Breathing Lying Down, Murmur, Swelling and Palpitations. Gastrointestinal:Not Present- Bloody Stool, Heartburn, Abdominal Pain, Vomiting, Nausea, Constipation, Diarrhea, Difficulty Swallowing, Jaundice and Loss of appetitie. Female Genitourinary:Not Present- Blood in Urine, Urinary frequency, Weak urinary stream, Discharge, Flank Pain,  Incontinence, Painful Urination, Urgency, Urinary Retention and Urinating at Night. Musculoskeletal:Present- Joint Pain. Not Present- Muscle Weakness, Muscle Pain, Joint Swelling, Back Pain, Morning Stiffness and Spasms. Neurological:Not Present- Tremor, Dizziness, Blackout spells, Paralysis, Difficulty with balance and Weakness. Psychiatric:Not Present- Insomnia.   Vitals Weight: 142 lb Height: 65 in Weight was reported by patient. Height was reported by patient. Body Surface Area: 1.72 m Body Mass Index: 23.63 kg/m Pulse: 64 (Regular) Resp.: 12 (Unlabored) BP: 108/62 (Sitting, Right Arm, Standard)    Physical Exam The physical exam findings are as follows:  Note: Patient is a 79 year old female with continued hip pain. Patient is accompanied today by her husband.   General Mental Status - Alert, cooperative and good historian. General Appearance- pleasant. Not in acute distress. Orientation- Oriented X3. Build & Nutrition- Well nourished and Well developed.   Head and Neck Head- normocephalic, atraumatic . Neck Global Assessment- supple. no bruit auscultated on the right and no bruit auscultated on the left.   Eye Vision- Wears corrective lenses. Pupil- Bilateral- Regular and Round. Motion- Bilateral- EOMI.   Chest and Lung Exam Auscultation: Breath sounds:- clear at anterior chest wall and - clear at posterior chest wall. Adventitious sounds:- No Adventitious sounds.   Cardiovascular Auscultation:Rhythm- Irregularly irregular (Known history of atrial fib). Heart Sounds- S1 WNL and S2 WNL. Murmurs & Other Heart Sounds:Auscultation of the heart reveals - No Murmurs.   Abdomen Palpation/Percussion:Tenderness- Abdomen is non-tender to palpation. Rigidity (guarding)- Abdomen is soft. Auscultation:Auscultation of the abdomen reveals - Bowel sounds normal.   Female Genitourinary Not done, not pertinent to present  illness  Musculoskeletal She is a well developed female. She is alert and oriented. No apparent distress. The right hip has a normal range of motion, no discomfort. The left hip shows flexion to 95, rotation in 10, out 20 and abduction 20 with discomfort. She has some tenderness around the   trochanter. The right knee shows swelling in the prepatella bursal area consistent with a hematoma (from a fall). She has full painfree PROM of the right knee at the time of her H&P. Knee is stable and extensor mechanism is intact. Only minimally tender abour the patella.  RADIOGRAPHS: Plain films, AP pelvis, AP and lateral of the hip do show significant joint space narrowing and a small area of sclerosis and collapse of the femoral head. The right hip looks normal.  MRI is reviewed and does show a lot of edema in the head and neck.  Assessment & Plan Complication NEC due to oth int orth device (996.78) Fx closed femur, base of neck (820.03) Avascular Necrosis Left Hip Note: Plan is for a Left Total Hip Replacement - Anterior Approach by Dr. Aluisio.  Plan is to go home following the hospitalization.  PCP - Dr. M. Badger Cardiology - Dr. P. Nahser  Signed electronically by DREW L PERKINS, PA-C  

## 2012-04-03 NOTE — Transfer of Care (Addendum)
Immediate Anesthesia Transfer of Care Note  Patient: Natalie Ho  Procedure(s) Performed: Procedure(s) (LRB): TOTAL HIP ARTHROPLASTY ANTERIOR APPROACH (Left)  Patient Location: PACU  Anesthesia Type: Spinal  Level of Consciousness: sedated, patient cooperative and responds to stimulaton  Airway & Oxygen Therapy: Patient Spontanous Breathing and Patient connected to face mask oxgen  Post-op Assessment: Report given to PACU RN and Post -op Vital signs reviewed and stable  Post vital signs: Reviewed and stable  Complications: No apparent anesthesia complications- Level T10 on spinal with patient denied of pain and unable to move lower ext.

## 2012-04-03 NOTE — Op Note (Signed)
OPERATIVE REPORT  PREOPERATIVE DIAGNOSIS: Osteoarthritis of the Left hip.   POSTOPERATIVE DIAGNOSIS: Osteoarthritis of the Left  hip.   PROCEDURE: Left total hip arthroplasty, anterior approach.   SURGEON: Ollen Gross, MD   ASSISTANT: Avel Peace, PA-C  ANESTHESIA:  Spinal  ESTIMATED BLOOD LOSS:-450 ml   DRAINS: Hemovac x1.   COMPLICATIONS: None   CONDITION: PACU - hemodynamically stable.   BRIEF CLINICAL NOTE: Natalie Ho is a 79 y.o. female who has advanced end-  stage arthritis of his Left  hip with progressively worsening pain and  dysfunction.The patient has failed nonoperative management and presents for  total hip arthroplasty. Radiographs demonstrate bone on one arthritis with subchondral cyst formation.  PROCEDURE IN DETAIL: After successful administration of spinal  anesthetic, the traction boots for the [facility] bed were placed on both  feet and the patient was placed onto the [facility] 2 bed, boots placed into the leg  holders. The Left hip was then isolated from the perineum with plastic  drapes and prepped and draped in the usual sterile fashion. ASIS and  greater trochanter were marked and a oblique incision was made, starting  at about 1 cm lateral and 2 cm distal to the ASIS and coursing towards  the anterior cortex of the femur. The skin was cut with a 10 blade  through subcutaneous tissue to the level of the fascia overlying the  tensor fascia lata muscle. The fascia was then incised in line with the  incision at the junction of the anterior third and posterior 2/3rd. The  muscle was teased off the fascia and then the interval between the TFL  and the rectus was developed. The Hohmann retractor was then placed at  the top of the femoral neck over the capsule. The vessels overlying the  capsule were cauterized and the fat on top of the capsule was removed.  A Hohmann retractor was then placed anterior underneath the rectus  femoris to give  exposure to the entire anterior capsule. A T-shaped  capsulotomy was performed. The edges were tagged and the femoral head  was identified.       Osteophytes are removed off the superior acetabulum.  The femoral neck was then cut in situ with an oscillating saw. Traction  was then applied to the left lower extremity utilizing the [facility]  traction. The femoral head was then removed. Retractors were placed  around the acetabulum and then circumferential removal of the labrum was  performed. Osteophytes were also removed. Reaming starts at 45 mm to  medialize and  Increased in 2 mm increments to 53 mm. We reamed in  approximately 40 degrees of abduction, 20 degrees anteversion. A 54 mm  pinnacle acetabular shell was then impacted in anatomic position under  fluoroscopic guidance with excellent purchase. We did not need to place  any additional dome screws. A 36 mm neutral + 4 marathon liner was then  placed into the acetabular shell.       The femoral lift was then placed along the lateral aspect of the femur  just distal to the vastus ridge. The leg was  externally rotated and capsule  was stripped off the inferior aspect of the femoral neck down to the  level of the lesser trochanter, this was done with electrocautery. The femur was lifted after this was performed. The  leg was then placed and extended in adducted position to essentially delivering the femur. We also removed the  capsule superiorly and the  piriformis from the piriformis fossa to gain excellent exposure of the  proximal femur. Rongeur was used to remove some cancellous bone to get  into the lateral portion of the proximal femur for placement of the  initial starter reamer. The starter broaches was placed  the starter broach  and was shown to go down the center of the canal. Broaching  with the  Corail system was then performed starting at size 8, coursing  Up to size 14. A size 14 had excellent torsional and rotational  and  axial stability. The trial standard offset neck was then placed  with a 36 + 8 trial head. The hip was then reduced. We confirmed that  the stem was in the canal both on AP and lateral x-rays. It also has excellent sizing. The hip was reduced with outstanding stability through full extension, full external rotation,  and then flexion in adduction internal rotation. AP pelvis was taken  and the leg lengths were measured and found to be exactly equal. Hip  was then dislocated again and the femoral head and neck removed. The  femoral broach was removed. Size 14 Corail stem with a standard offset  neck was then impacted into the femur following native anteversion. Has  excellent purchase in the canal. Excellent torsional and rotational and  axial stability. It is confirmed to be in the canal on AP and lateral  fluoroscopic views. The metal 36 +8 head was placed and the hip  reduced with outstanding stability. Again AP pelvis was taken and it  confirmed that the leg lengths were equal. The wound was then copiously  irrigated with saline solution and the capsule reattached and repaired  with Ethibond suture.  20 mL of Exparel mixed with 50 mL of saline injected  into the capsule and into the edge of the tensor fascia lata as well as  subcutaneous tissue. The fascia overlying the tensor fascia lata was  then closed with a running #1 V-Loc. Subcu was closed with interrupted  2-0 Vicryl and subcuticular running 4-0 Monocryl. Incision was cleaned  and dried. Steri-Strips and a bulky sterile dressing applied. Hemovac  drain was hooked to suction and then he was awakened and transported to  recovery in stable condition.        Please note that a surgical assistant was a medical necessity for this procedure to perform it in a safe and expeditious manner. Assistant was necessary to provide appropriate retraction of vital neurovascular structures and to prevent femoral fracture and allow for anatomic  placement of the prosthesis.  Ollen Gross, M.D.

## 2012-04-03 NOTE — Progress Notes (Signed)
Pt has a bruised Rt knee and Rt shoulder from a recent fall. Dr. Lequita Halt is aware of this

## 2012-04-04 ENCOUNTER — Encounter (HOSPITAL_COMMUNITY): Payer: Self-pay | Admitting: Orthopedic Surgery

## 2012-04-04 DIAGNOSIS — E871 Hypo-osmolality and hyponatremia: Secondary | ICD-10-CM

## 2012-04-04 DIAGNOSIS — D62 Acute posthemorrhagic anemia: Secondary | ICD-10-CM

## 2012-04-04 LAB — PROTIME-INR
INR: 1.19 (ref 0.00–1.49)
Prothrombin Time: 14.9 seconds (ref 11.6–15.2)

## 2012-04-04 LAB — CBC
MCH: 30.3 pg (ref 26.0–34.0)
MCHC: 33.9 g/dL (ref 30.0–36.0)
Platelets: 187 10*3/uL (ref 150–400)
RBC: 3.07 MIL/uL — ABNORMAL LOW (ref 3.87–5.11)

## 2012-04-04 LAB — BASIC METABOLIC PANEL
Calcium: 8.1 mg/dL — ABNORMAL LOW (ref 8.4–10.5)
GFR calc non Af Amer: 85 mL/min — ABNORMAL LOW (ref >90)
Sodium: 128 mEq/L — ABNORMAL LOW (ref 135–145)

## 2012-04-04 MED ORDER — POLYSACCHARIDE IRON COMPLEX 150 MG PO CAPS
150.0000 mg | ORAL_CAPSULE | Freq: Every day | ORAL | Status: DC
  Administered 2012-04-04 – 2012-04-05 (×2): 150 mg via ORAL
  Filled 2012-04-04 (×2): qty 1

## 2012-04-04 MED ORDER — NON FORMULARY: 20.0000 mg | Freq: Every morning | Status: DC

## 2012-04-04 MED ORDER — POLYSACCHARIDE IRON COMPLEX 150 MG PO CAPS: 150.0000 mg | ORAL_CAPSULE | Freq: Every day | ORAL | Status: DC

## 2012-04-04 MED ORDER — OMEPRAZOLE 20 MG PO CPDR
20.0000 mg | DELAYED_RELEASE_CAPSULE | Freq: Every day | ORAL | Status: DC
  Administered 2012-04-04 – 2012-04-05 (×2): 20 mg via ORAL
  Filled 2012-04-04 (×3): qty 1

## 2012-04-04 MED ORDER — WARFARIN SODIUM 5 MG PO TABS
5.0000 mg | ORAL_TABLET | Freq: Once | ORAL | Status: AC
  Administered 2012-04-04: 5 mg via ORAL
  Filled 2012-04-04: qty 1

## 2012-04-04 MED ORDER — ENOXAPARIN SODIUM 40 MG/0.4ML ~~LOC~~ SOLN: 40.0000 mg | SUBCUTANEOUS | Status: DC

## 2012-04-04 MED ORDER — METHOCARBAMOL 500 MG PO TABS: 500.0000 mg | ORAL_TABLET | Freq: Four times a day (QID) | ORAL | Status: DC | PRN

## 2012-04-04 MED ORDER — OXYCODONE HCL 5 MG PO TABS: 5.0000 mg | ORAL_TABLET | ORAL | Status: DC | PRN

## 2012-04-04 NOTE — Clinical Documentation Improvement (Signed)
Anemia Blood Loss Clarification  THIS DOCUMENT IS NOT A PERMANENT PART OF THE MEDICAL RECORD  RESPOND TO THE THIS QUERY, FOLLOW THE INSTRUCTIONS BELOW:  1. If needed, update documentation for the patient's encounter via the notes activity.  2. Access this query again and click edit on the In [employer].  3. After updating, or not, click F2 to complete all highlighted (required) fields concerning your review. Select "additional documentation in the medical record" OR "no additional documentation provided".  4. Click Sign note button.  5. The deficiency will fall out of your In Basket *Please let us know if you are not able to complete this workflow by phone or e-mail (listed below).        04/04/12  Dear Natalie Peace PA Marton Redwood  In an effort to better capture your patient's severity of illness, reflect appropriate length of stay and utilization of resources, a review of the patient medical record has revealed the following indicators.    Based on your clinical judgment, please clarify and document in a progress note and/or discharge summary the clinical condition associated with the following supporting information:  In responding to this query please exercise your independent judgment.  The fact that a query is asked, does not imply that any particular answer is desired or expected.  Clarification Needed   Please clarify the underlying diagnosis responsible for the abnormal post op H/H of 9.3/27.4 in setting of EBL fo treated with Niferex cap 150 mg.   Possible Clinical Conditions?   " Expected Acute Blood Loss Anemia  " Acute Blood Loss Anemia  " Acute on chronic blood loss anemia   " Other Condition________________  " Cannot Clinically Determine  Risk Factors: (recent surgery, pre op anemia, EBL in OR)  Supporting Information: Complication NEC d/t orth device, fx closed femur, base of neck, avascular necrosis  Signs and Symptoms     Diagnostics: EBL=450 Component     Latest Ref Rng 04/04/2012          Hemoglobin     12.0 - 15.0 g/dL 9.3 (L)  HCT     41.3 - 46.0 % 27.4 (L)   Treatments: iron polysaccharides (NIFEREX) capsule 150 mg   Reviewed: additional documentation in the medical record  Thank You,  Enis Slipper  RN, BSN, MSN/Inf, CCDS Clinical Documentation Specialist [facility] Dept Pager: [phone] / E-mail: [email or web address]  Health Information Management [place]  Components were added to the problem list. Kenard Gower

## 2012-04-04 NOTE — Evaluation (Signed)
Physical Therapy Evaluation Patient Details Name: Natalie Ho MRN: [id number] DOB: [birth year] Today's Date: 04/04/2012 Time: 0940-1010 PT Time Calculation (min): 30 min  PT Assessment / Plan / Recommendation Clinical Impression  Pt s/p L THR presents with decreased L EL strength/ROM and post pain and N&V limiting functional mobility    PT Assessment  Patient needs continued PT services    Follow Up Recommendations  Home health PT    Does the patient have the potential to tolerate intense rehabilitation      Barriers to Discharge None      Equipment Recommendations  None recommended by PT    Recommendations for Other Services OT consult   Frequency 7X/week    Precautions / Restrictions Precautions Precautions: None Restrictions Weight Bearing Restrictions: No Other Position/Activity Restrictions: Direct anterior. Noprecautions   Pertinent Vitals/Pain Min c/o pain; premed      Mobility  Bed Mobility Bed Mobility: Supine to Sit;Sit to Supine;Sitting - Scoot to Edge of Bed Supine to Sit: 5: Supervision;HOB elevated Sitting - Scoot to Edge of Bed: 5: Supervision Sit to Supine: 5: Supervision Details for Bed Mobility Assistance: Pt moves quickly. vc to slow pace to decrease pain Transfers Transfers: Sit to Stand;Stand to Sit Sit to Stand: 5: Supervision;With upper extremity assist;From bed Stand to Sit: 5: Supervision;With upper extremity assist;To bed Details for Transfer Assistance: cues for use of UEs to self assist and for LE management Ambulation/Gait Ambulation/Gait Assistance: 4: Min assist Ambulation Distance (Feet): 15 Feet Assistive device: Rolling walker Ambulation/Gait Assistance Details: cues for posture, sequence, position from RW - ltd by onset of N&V Gait Pattern: Step-to pattern    Shoulder Instructions     Exercises Total Joint Exercises Ankle Circles/Pumps: AROM;10 reps;Supine;Both Quad Sets: AROM;Both;10 reps;Supine Heel Slides:  AAROM;Left;Supine;15 reps Hip ABduction/ADduction: AAROM;Left;15 reps;Supine   PT Diagnosis: Difficulty walking  PT Problem List: Decreased strength;Decreased range of motion;Decreased activity tolerance;Decreased mobility;Decreased knowledge of use of DME;Pain PT Treatment Interventions: DME instruction;Gait training;Stair training;Functional mobility training;Therapeutic activities;Therapeutic exercise;Patient/family education   PT Goals Acute Rehab PT Goals PT Goal Formulation: With patient Time For Goal Achievement: 04/10/12 Potential to Achieve Goals: Good Pt will go Supine/Side to Sit: with supervision PT Goal: Supine/Side to Sit - Progress: Goal set today Pt will go Sit to Supine/Side: with supervision PT Goal: Sit to Supine/Side - Progress: Goal set today Pt will go Sit to Stand: with supervision PT Goal: Sit to Stand - Progress: Goal set today Pt will go Stand to Sit: with supervision PT Goal: Stand to Sit - Progress: Goal set today Pt will Ambulate: 51 - 150 feet;with supervision;with rolling walker PT Goal: Ambulate - Progress: Goal set today Pt will Go Up / Down Stairs: 1-2 stairs;with min assist;with least restrictive assistive device PT Goal: Up/Down Stairs - Progress: Goal set today  Visit Information  Last PT Received On: 04/04/12 Assistance Needed: +2    Subjective Data  Subjective: I'm ready to move, I think Patient Stated Goal: Resume previous lifestyle with decreased pain   Prior Functioning  Home Living Lives With: Spouse Available Help at Discharge: Available 24 hours/day Type of Home: House Home Access: Stairs to enter [employer] of Steps: 2 Entrance Stairs-Rails: None Home Layout: Two level;Able to live on main level with bedroom/bathroom Bathroom Shower/Tub: Walk-in shower;Door [employer] Toilet: Handicapped height Bathroom Accessibility: Yes How Accessible: Accessible via walker Home Adaptive Equipment: Bedside commode/3-in-1;Walker -  rolling;Built-in shower seat Prior Function Level of Independence: Independent Able to Take Stairs?: Yes Driving:  Yes Vocation: Retired Comments: if Leisure centre manager: No difficulties Dominant Hand: Right    Cognition  Overall Cognitive Status: Appears within functional limits for tasks assessed/performed Arousal/Alertness: Awake/alert Orientation Level: Appears intact for tasks assessed Behavior During Session: [facility] for tasks performed    Extremity/Trunk Assessment Right Upper Extremity Assessment RUE ROM/Strength/Tone: [facility] for tasks assessed Left Upper Extremity Assessment LUE ROM/Strength/Tone: WFL for tasks assessed Right Lower Extremity Assessment RLE ROM/Strength/Tone: [facility] for tasks assessed Left Lower Extremity Assessment LLE ROM/Strength/Tone: Deficits;Due to pain;Due to precautions LLE ROM/Strength/Tone Deficits: Hip strength 3-/5 with AAROM to 90 flex and 20 abd Trunk Assessment Trunk Assessment: Normal   Balance    End of Session PT - End of Session Equipment Utilized During Treatment: Gait belt Activity Tolerance: Other (comment) (ltd by N&V) Patient left: in chair;with call bell/phone within reach;with family/visitor present Nurse Communication: Mobility status  GP     Marga Gramajo 04/04/2012, 12:29 PM

## 2012-04-04 NOTE — Progress Notes (Signed)
   Subjective: 1 Day Post-Op Procedure(s) (LRB): TOTAL HIP ARTHROPLASTY ANTERIOR APPROACH (Left) Patient reports pain as mild.   Patient seen in rounds with Dr. Lequita Halt. Patient is well, and has had no acute complaints or problems except for not sleeping. We will start therapy today.  Plan is to go Home after hospital stay.  Objective: Vital signs in last 24 hours: Temp:  [93.5 F (34.2 C)-98.9 F (37.2 C)] 97.9 F (36.6 C) (01/23 0530) Pulse Rate:  [53-69] 60  (01/23 0530) Resp:  [11-20] 16  (01/23 0530) BP: (99-135)/(46-71) 106/60 mmHg (01/23 0530) SpO2:  [96 %-100 %] 96 % (01/23 0530) Weight:  [63.957 kg (141 lb)] 63.957 kg (141 lb) (01/22 1627)  Intake/Output from previous day:  Intake/Output Summary (Last 24 hours) at 04/04/12 0838 Last data filed at 04/04/12 0600  Gross per 24 hour  Intake   3745 ml  Output   1745 ml  Net   2000 ml    Intake/Output this shift: UOP 450 since MN +2000  Labs:  [facility] 04/04/12 0454  HGB 9.3*    Basename 04/04/12 0454  WBC 5.2  RBC 3.07*  HCT 27.4*  PLT 187    Basename 04/04/12 0454  NA 128*  K 4.5  CL 95*  CO2 30  BUN 11  CREATININE 0.58  GLUCOSE 170*  CALCIUM 8.1*    Basename 04/04/12 0454 04/03/12 1155  LABPT -- --  INR 1.19 1.03    EXAM General - Patient is Alert, Appropriate and Oriented Extremity - Neurovascular intact Sensation intact distally Dorsiflexion/Plantar flexion intact Dressing - dressing C/D/I Motor Function - intact, moving foot and toes well on exam.  Hemovac pulled without difficulty.  Past Medical History  Diagnosis Date  . Atrial fibrillation   . Aortic insufficiency   . Hypertension   . Hyperlipidemia   . SOB (shortness of breath)   . Headache   . Gout   . GERD (gastroesophageal reflux disease)   . Colitis   . Arthritis   . Anxiety   . Chronic anticoagulation   . Stroke     TIA-3'08, none recent  . Melanoma of nose     "was only a precancer area"-no problems since     Assessment/Plan: 1 Day Post-Op Procedure(s) (LRB): TOTAL HIP ARTHROPLASTY ANTERIOR APPROACH (Left) Principal Problem:  *OA (osteoarthritis) of hip  Estimated Body mass index is 22.08 kg/(m^2) as calculated from the following:   Height as of this encounter: 5\' 7" (1.702 m).   Weight as of this encounter: 141 lb(63.957 kg). Advance diet Up with therapy Plan for discharge tomorrow Discharge home with home health  DVT Prophylaxis - Lovenox and Coumadin Weight Bearing As Tolerated left Leg Hemovac Pulled Begin Therapy No vaccines.  Patrica Duel 04/04/2012, 8:38 AM

## 2012-04-04 NOTE — Progress Notes (Signed)
ANTICOAGULATION CONSULT NOTE - Follow Up Consult  Pharmacy Consult for Coumadin Indication: atrial fibrillation and VTE prophylaxis  Allergies  Allergen Reactions  . Benzonatate Other (See Comments)    Halluccinations  . Shrimp (Shellfish Allergy) Anaphylaxis and Swelling  . Alendronate Sodium Other (See Comments)    Unknown reaction  . Dabigatran Etexilate Mesylate Other (See Comments)    Upset stomach"Pradaxa"  . Levofloxacin Other (See Comments)    "made her funny in the head"  . Sulfonamide Derivatives Other (See Comments)    Unknown reaction  . Tramadol Other (See Comments)    Unknown reaction   Labs:  Basename 04/04/12 0454 04/03/12 1155  HGB 9.3* --  HCT 27.4* --  PLT 187 --  APTT -- --  LABPROT 14.9 13.4  INR 1.19 1.03  HEPARINUNFRC -- --  CREATININE 0.58 --  CKTOTAL -- --  CKMB -- --  TROPONINI -- --    Assessment: 81 yof on Coumadin PTA (2.5mg  Wed, Sat and 5 mg other days of the week) for h/o afib and CVA.  Coumadin placed on hold and Lovenox 100 mg sq daily started 1/17 for scheduled L THA 1/22.  Patient is now s/p OR and Coumadin ordered to resumed post-op along with Lovenox 40 mg sq daily until INR >/= 1.8.    INR 1.19 today. Hgb 9.3 post-op.  No bleeding. Plan discharge tomorrow.    Goal of Therapy:  INR 2-3 Monitor platelets by anticoagulation protocol: Yes   Plan:   Repeat Coumadin 5mg .    Continue Lovenox until INR >/= 1.8  Daily PT/INR  Geoffry Paradise, PharmD, BCPS Pager: [phone] 10:07 AM Pharmacy #: [id number]

## 2012-04-04 NOTE — Progress Notes (Signed)
Physical Therapy Treatment Patient Details Name: Natalie Ho MRN: [id number] DOB: Jun 19, 1930 Today's Date: 04/04/2012 Time: 1438-1500 PT Time Calculation (min): 22 min  PT Assessment / Plan / Recommendation Comments on Treatment Session       Follow Up Recommendations  Home health PT     Does the patient have the potential to tolerate intense rehabilitation     Barriers to Discharge        Equipment Recommendations  None recommended by PT    Recommendations for Other Services OT consult  Frequency 7X/week   Plan Discharge plan remains appropriate    Precautions / Restrictions Precautions Precautions: None Restrictions Weight Bearing Restrictions: No Other Position/Activity Restrictions: Direct anterior. Noprecautions   Pertinent Vitals/Pain Min c/o pain    Mobility  Bed Mobility Bed Mobility: Supine to Sit Supine to Sit: 5: Supervision;4: Min guard Transfers Transfers: Sit to Stand;Stand to Sit Sit to Stand: 4: Min guard Stand to Sit: 4: Min guard Details for Transfer Assistance: cues for use of UEs to self assist and for LE management Ambulation/Gait Ambulation/Gait Assistance: 4: Min assist;4: Min Government social research officer (Feet): 111 Feet Assistive device: Rolling walker Ambulation/Gait Assistance Details: cues for posture and position from RW Gait Pattern: Step-to pattern;Step-through pattern    Exercises     PT Diagnosis:    PT Problem List:   PT Treatment Interventions:     PT Goals Acute Rehab PT Goals PT Goal Formulation: With patient Time For Goal Achievement: 04/10/12 Potential to Achieve Goals: Good Pt will go Supine/Side to Sit: with supervision PT Goal: Supine/Side to Sit - Progress: Progressing toward goal Pt will go Sit to Supine/Side: with supervision PT Goal: Sit to Supine/Side - Progress: Progressing toward goal Pt will go Sit to Stand: with supervision PT Goal: Sit to Stand - Progress: Progressing toward goal Pt will go Stand  to Sit: with supervision PT Goal: Stand to Sit - Progress: Progressing toward goal Pt will Ambulate: 51 - 150 feet;with supervision;with rolling walker PT Goal: Ambulate - Progress: Progressing toward goal Pt will Go Up / Down Stairs: 1-2 stairs;with min assist;with least restrictive assistive device PT Goal: Up/Down Stairs - Progress: Goal set today  Visit Information  Last PT Received On: 04/04/12 Assistance Needed: +1    Subjective Data  Subjective: I'm feeling better Patient Stated Goal: Resume previous lifestyle with decreased pain   Cognition  Overall Cognitive Status: Appears within functional limits for tasks assessed/performed Arousal/Alertness: Awake/alert Orientation Level: Appears intact for tasks assessed Behavior During Session: [facility] for tasks performed    Balance     End of Session PT - End of Session Equipment Utilized During Treatment: Gait belt Activity Tolerance: Patient tolerated treatment well Patient left: in chair;with call bell/phone within reach;with family/visitor present Nurse Communication: Mobility status   GP     Natalie Ho 04/04/2012, 3:04 PM

## 2012-04-04 NOTE — Progress Notes (Signed)
Utilization review completed.  

## 2012-04-04 NOTE — Progress Notes (Signed)
Occupational Therapy Evaluation Patient Details Name: Natalie Ho MRN: [id number] DOB: 1931-01-24 Today's Date: 04/04/2012 Time: 1135-1208 OT Time Calculation (min): 33 min  OT Assessment / Plan / Recommendation Clinical Impression  79 yo s/p L Ant  THA. No precautions. Making excellent progress. PTA, pt independent with ADL and mobility. Pt will benefit from skilled OT services to max indpendence with ADL and functional mobility for ADL to facilitate safe D/C home. Rec HHOT.    OT Assessment  Patient needs continued OT Services    Follow Up Recommendations  Home health OT    Barriers to Discharge None    Equipment Recommendations  None recommended by OT    Recommendations for Other Services  none  Frequency  Min 2X/week    Precautions / Restrictions Precautions Precautions: None Restrictions Weight Bearing Restrictions: No Other Position/Activity Restrictions: Direct anterior. Noprecautions   Pertinent Vitals/Pain L hip 2. O2 sats 97 RA    ADL  Grooming: Supervision/safety Where Assessed - Grooming: Unsupported standing Upper Body Bathing: Supervision/safety Where Assessed - Upper Body Bathing: Unsupported sitting Lower Body Bathing: Moderate assistance Where Assessed - Lower Body Bathing: Supported sit to stand Upper Body Dressing: Set up Where Assessed - Upper Body Dressing: Unsupported sitting Lower Body Dressing: Moderate assistance Where Assessed - Lower Body Dressing: Unsupported sit to stand Toilet Transfer: Supervision/safety Toilet Transfer Method: Sit to Barista: Other (comment) (bed - chair) Toileting - Clothing Manipulation and Hygiene: Supervision/safety Where Assessed - Toileting Clothing Manipulation and Hygiene: Standing Equipment Used: Gait belt;Long-handled sponge;Reacher;Rolling walker;Sock aid Transfers/Ambulation Related to ADLs: S ADL Comments: Educated pt on availability of AE to assist with ADL. Pt had difficulty  with LB ADL PTA.  Pt has nec DME    OT Diagnosis: Generalized weakness;Acute pain  OT Problem List: Decreased strength;Decreased range of motion;Decreased activity tolerance;Decreased safety awareness;Decreased knowledge of use of DME or AE;Pain OT Treatment Interventions: Self-care/ADL training;Therapeutic exercise;Energy conservation;DME and/or AE instruction;Therapeutic activities;Patient/family education   OT Goals Acute Rehab OT Goals OT Goal Formulation: With patient Time For Goal Achievement: 04/11/12 Potential to Achieve Goals: Good ADL Goals Pt Will Perform Lower Body Bathing: with supervision;Sit to stand from bed;Unsupported;with adaptive equipment ADL Goal: Lower Body Bathing - Progress: Goal set today Pt Will Perform Lower Body Dressing: with supervision;Sit to stand from bed;Unsupported;with adaptive equipment ADL Goal: Lower Body Dressing - Progress: Goal set today Pt Will Transfer to Toilet: with modified independence;Ambulation;with DME;3-in-1 ADL Goal: Toilet Transfer - Progress: Goal set today Pt Will Perform Tub/Shower Transfer: with supervision;Ambulation;with DME;Shower transfer ADL Goal: Web designer - Progress: Goal set today  Visit Information  Last OT Received On: 04/04/12    Subjective Data   I had a fall recently   Prior Functioning     Home Living Lives With: Spouse Available Help at Discharge: Available 24 hours/day Type of Home: House Home Access: Stairs to enter [employer] of Steps: 2 Entrance Stairs-Rails: None Home Layout: Two level;Able to live on main level with bedroom/bathroom Bathroom Shower/Tub: Walk-in shower;Door [employer] Toilet: Handicapped height Bathroom Accessibility: Yes How Accessible: Accessible via walker Home Adaptive Equipment: Bedside commode/3-in-1;Walker - rolling;Built-in shower seat Prior Function Level of Independence: Independent Able to Take Stairs?: Yes Driving: Yes Vocation:  Retired Comments: if Leisure centre manager: No difficulties Dominant Hand: Right         Vision/Perception  wears glasses   Cognition  Overall Cognitive Status: Appears within functional limits for tasks assessed/performed Arousal/Alertness: Awake/alert Orientation Level: Appears intact  for tasks assessed Behavior During Session: [facility] for tasks performed    Extremity/Trunk Assessment Right Upper Extremity Assessment RUE ROM/Strength/Tone: [facility] for tasks assessed Left Upper Extremity Assessment LUE ROM/Strength/Tone: [facility] for tasks assessed Right Lower Extremity Assessment RLE ROM/Strength/Tone: [facility] for tasks assessed Left Lower Extremity Assessment LLE ROM/Strength/Tone: Deficits;Due to pain;Due to precautions Trunk Assessment Trunk Assessment: Normal     Mobility Bed Mobility Bed Mobility: Supine to Sit;Sit to Supine;Sitting - Scoot to Edge of Bed Supine to Sit: 5: Supervision;HOB elevated Sitting - Scoot to Edge of Bed: 5: Supervision Sit to Supine: 5: Supervision Details for Bed Mobility Assistance: Pt moves quickly. vc to slow pace to decrease pain Transfers Transfers: Sit to Stand;Stand to Sit Sit to Stand: 5: Supervision;With upper extremity assist;From bed Stand to Sit: 5: Supervision;With upper extremity assist;To bed     Shoulder Instructions     Exercise     Balance  S for ADL   End of Session OT - End of Session Equipment Utilized During Treatment: Gait belt Activity Tolerance: Patient tolerated treatment well Patient left: in bed;with call bell/phone within reach Nurse Communication: Mobility status  GO     [place]eca Gadbois,HILLARY 04/04/2012, 12:17 PM [facility], OTR/L  [phone] 04/04/2012

## 2012-04-05 LAB — PROTIME-INR: INR: 1.59 — ABNORMAL HIGH (ref 0.00–1.49)

## 2012-04-05 LAB — BASIC METABOLIC PANEL
BUN: 10 mg/dL (ref 6–23)
GFR calc Af Amer: >90 mL/min (ref >90)
GFR calc non Af Amer: 85 mL/min — ABNORMAL LOW (ref >90)
Potassium: 4.2 mEq/L (ref 3.5–5.1)
Sodium: 131 mEq/L — ABNORMAL LOW (ref 135–145)

## 2012-04-05 LAB — CBC
MCHC: 33.7 g/dL (ref 30.0–36.0)
Platelets: 210 10*3/uL (ref 150–400)
RDW: 14.4 % (ref 11.5–15.5)

## 2012-04-05 MED ORDER — POLYSACCHARIDE IRON COMPLEX 150 MG PO CAPS: 150.0000 mg | ORAL_CAPSULE | Freq: Every day | ORAL | Status: DC

## 2012-04-05 MED ORDER — WARFARIN SODIUM 5 MG PO TABS: 5.0000 mg | ORAL_TABLET | Freq: Once | ORAL | Status: DC

## 2012-04-05 MED ORDER — ENOXAPARIN SODIUM 40 MG/0.4ML ~~LOC~~ SOLN: 40.0000 mg | SUBCUTANEOUS | Status: DC

## 2012-04-05 MED ORDER — OXYCODONE HCL 5 MG PO TABS: 5.0000 mg | ORAL_TABLET | ORAL | Status: DC | PRN

## 2012-04-05 MED ORDER — WARFARIN SODIUM 5 MG PO TABS
5.0000 mg | ORAL_TABLET | Freq: Once | ORAL | Status: DC
  Filled 2012-04-05: qty 1

## 2012-04-05 MED ORDER — METHOCARBAMOL 500 MG PO TABS: 500.0000 mg | ORAL_TABLET | Freq: Four times a day (QID) | ORAL | Status: DC | PRN

## 2012-04-05 NOTE — Care Management Note (Signed)
    Page 1 of 2   04/05/2012     1:35:16 PM   CARE MANAGEMENT NOTE 04/05/2012  Patient:  Natalie Ho, Natalie Ho   Account Number:  [id number]  Date Initiated:  04/05/2012  Documentation initiated by:  Colleen Can  Subjective/Objective Assessment:   dx avascular necrosis left hip; toytal hip replacemnt  on day of admission     Action/Plan:   CM spoke with patient . Plans are for hewr to return to her home with spouse as caregiver. Already has rw and commode seat.   Anticipated DC Date:  04/05/2012   Anticipated DC Plan:  HOME W HOME HEALTH SERVICES  In-house referral  Clinical Social Worker      DC Planning Services  CM consult      [facility] Choice  HOME HEALTH   Choice offered to / List presented to:  C-1 Patient   DME arranged  NA      DME agency  NA     HH arranged  HH-1 RN  HH-2 PT      [facility] agency  [facility]   Status of service:  Completed, signed off Medicare Important Message given?  NA - LOS <3 / Initial given by admissions (If response is "NO", the following Medicare IM given date fields will be blank) Date Medicare IM given:   Date Additional Medicare IM given:    Discharge Disposition:  HOME W HOME HEALTH SERVICES  Per UR Regulation:    If discussed at Long Length of Stay Meetings, dates discussed:    Comments:  04/05/2012 Colleen Can BSN RN CCM [phone] Genevieve Norlander will start [facility] services day after patient is discharged.

## 2012-04-05 NOTE — Progress Notes (Signed)
   Subjective: 2 Days Post-Op Procedure(s) (LRB): TOTAL HIP ARTHROPLASTY ANTERIOR APPROACH (Left) Patient reports pain as mild.   Patient seen in rounds with Dr. Lequita Halt. Patient is well, and has had no acute complaints or problems Patient is ready to go home today.  Objective: Vital signs in last 24 hours: Temp:  [97.6 F (36.4 C)-98.4 F (36.9 C)] 98 F (36.7 C) (01/24 0544) Pulse Rate:  [57-71] 71  (01/24 0544) Resp:  [16-18] 18  (01/24 0735) BP: (90-140)/(47-72) 140/72 mmHg (01/24 0544) SpO2:  [92 %-97 %] 97 % (01/24 0544)  Intake/Output from previous day:  Intake/Output Summary (Last 24 hours) at 04/05/12 0906 Last data filed at 04/05/12 0800  Gross per 24 hour  Intake 2395.92 ml  Output   1725 ml  Net 670.92 ml    Intake/Output this shift: Total I/O In: 240 [P.O.:240] Out: -   Labs:  Basename 04/05/12 0435 04/04/12 0454  HGB 8.8* 9.3*    Basename 04/05/12 0435 04/04/12 0454  WBC 7.8 5.2  RBC 2.93* 3.07*  HCT 26.1* 27.4*  PLT 210 187    Basename 04/05/12 0435 04/04/12 0454  NA 131* 128*  K 4.2 4.5  CL 99 95*  CO2 28 30  BUN 10 11  CREATININE 0.58 0.58  GLUCOSE 138* 170*  CALCIUM 8.5 8.1*    Basename 04/05/12 0435 04/04/12 0454  LABPT -- --  INR 1.59* 1.19    EXAM: General - Patient is Alert, Appropriate and Oriented Extremity - Neurovascular intact Sensation intact distally Dorsiflexion/Plantar flexion intact No cellulitis present Incision - clean, dry, no drainage, healing Motor Function - intact, moving foot and toes well on exam.   Assessment/Plan: 2 Days Post-Op Procedure(s) (LRB): TOTAL HIP ARTHROPLASTY ANTERIOR APPROACH (Left) Procedure(s) (LRB): TOTAL HIP ARTHROPLASTY ANTERIOR APPROACH (Left) Past Medical History  Diagnosis Date  . Atrial fibrillation   . Aortic insufficiency   . Hypertension   . Hyperlipidemia   . SOB (shortness of breath)   . Headache   . Gout   . GERD (gastroesophageal reflux disease)   . Colitis     . Arthritis   . Anxiety   . Chronic anticoagulation   . Stroke     TIA-3'08, none recent  . Melanoma of nose     "was only a precancer area"-no problems since   Principal Problem:  *OA (osteoarthritis) of hip Active Problems:  Postop Acute blood loss anemia  Postop Hyponatremia  Estimated Body mass index is 22.08 kg/(m^2) as calculated from the following:   Height as of this encounter: 5\' 7" (1.702 m).   Weight as of this encounter: 141 lb(63.957 kg). Advance diet Discharge home with home health Diet - Cardiac diet Follow up - in 2 weeks Activity - WBAT Disposition - Home Condition Upon Discharge - Good D/C Meds - See DC Summary DVT Prophylaxis - Lovenox and Coumadin  Natalie Ho 04/05/2012, 9:06 AM

## 2012-04-05 NOTE — Progress Notes (Signed)
ANTICOAGULATION CONSULT NOTE - Follow Up Consult  Pharmacy Consult for Coumadin Indication: atrial fibrillation and VTE prophylaxis  Allergies  Allergen Reactions  . Benzonatate Other (See Comments)    Halluccinations  . Shrimp (Shellfish Allergy) Anaphylaxis and Swelling  . Alendronate Sodium Other (See Comments)    Unknown reaction  . Dabigatran Etexilate Mesylate Other (See Comments)    Upset stomach"Pradaxa"  . Levofloxacin Other (See Comments)    "made her funny in the head"  . Sulfonamide Derivatives Other (See Comments)    Unknown reaction  . Tramadol Other (See Comments)    Unknown reaction   Labs:  Basename 04/05/12 0435 04/04/12 0454 04/03/12 1155  HGB 8.8* 9.3* --  HCT 26.1* 27.4* --  PLT 210 187 --  APTT -- -- --  LABPROT 18.5* 14.9 13.4  INR 1.59* 1.19 1.03  HEPARINUNFRC -- -- --  CREATININE 0.58 0.58 --  CKTOTAL -- -- --  CKMB -- -- --  TROPONINI -- -- --    Assessment: 81 yof on Coumadin PTA (2.5mg  Wed, Sat and 5 mg other days of the week) for h/o afib and CVA.  Coumadin placed on hold and Lovenox 100 mg sq daily started 1/17 for scheduled L THA 1/22.  Patient is now s/p OR and Coumadin ordered to resumed post-op along with Lovenox 40 mg sq daily until INR >/= 1.8.    INR increased to 1.59 today.   Hgb decreased to 8.8 today; no bleeding.  Goal of Therapy:  INR 2-3 Monitor platelets by anticoagulation protocol: Yes   Plan:   Warfarin 5mg  PO tonight ordered per MD.  Continue Lovenox until INR >/= 1.8  Daily PT/INR while inpatient.  For discharge:  will need to decrease to previous home regimen soon.  Please have INR rechecked in 2 days.   Lynann Beaver PharmD, BCPS Pager [phone] 04/05/2012 10:11 AM

## 2012-04-05 NOTE — Progress Notes (Signed)
Physical Therapy Treatment Patient Details Name: Natalie Ho MRN: [id number] DOB: 07-21-1930 Today's Date: 04/05/2012 Time: 1027-1103 PT Time Calculation (min): 36 min  PT Assessment / Plan / Recommendation Comments on Treatment Session       Follow Up Recommendations  Home health PT     Does the patient have the potential to tolerate intense rehabilitation     Barriers to Discharge        Equipment Recommendations  None recommended by PT    Recommendations for Other Services OT consult  Frequency 7X/week   Plan Discharge plan remains appropriate    Precautions / Restrictions Precautions Precautions: None Restrictions Weight Bearing Restrictions: No Other Position/Activity Restrictions: Direct anterior. Noprecautions   Pertinent Vitals/Pain 5/10 with activity,     Mobility  Bed Mobility Bed Mobility: Sit to Supine Sit to Supine: 5: Supervision Details for Bed Mobility Assistance: cues for saftey Transfers Transfers: Sit to Stand;Stand to Sit Sit to Stand: 4: Min guard;5: Supervision Stand to Sit: 4: Min guard;5: Supervision Details for Transfer Assistance: cues for use of UEs to self assist and for LE management Ambulation/Gait Ambulation/Gait Assistance: 4: Min guard;5: Supervision Ambulation Distance (Feet): 250 Feet Assistive device: Rolling walker Ambulation/Gait Assistance Details: cues for posture, position from RW and saftey awareness Gait Pattern: Step-to pattern;Step-through pattern    Exercises Total Joint Exercises Ankle Circles/Pumps: AROM;Supine;Both;20 reps Quad Sets: AROM;Both;Supine;20 reps Gluteal Sets: AROM;10 reps;Supine;Both Heel Slides: AAROM;Left;Supine;20 reps Hip ABduction/ADduction: AAROM;Left;Supine;20 reps   PT Diagnosis:    PT Problem List:   PT Treatment Interventions:     PT Goals Acute Rehab PT Goals PT Goal Formulation: With patient Time For Goal Achievement: 04/10/12 Potential to Achieve Goals: Good Pt will go  Supine/Side to Sit: with supervision PT Goal: Supine/Side to Sit - Progress: Progressing toward goal Pt will go Sit to Supine/Side: with supervision PT Goal: Sit to Supine/Side - Progress: Progressing toward goal Pt will go Sit to Stand: with supervision PT Goal: Sit to Stand - Progress: Progressing toward goal Pt will go Stand to Sit: with supervision PT Goal: Stand to Sit - Progress: Progressing toward goal Pt will Ambulate: 51 - 150 feet;with supervision;with rolling walker PT Goal: Ambulate - Progress: Progressing toward goal  Visit Information  Last PT Received On: 04/05/12 Assistance Needed: +1    Subjective Data  Subjective: I'm feeling better Patient Stated Goal: Resume previous lifestyle with decreased pain   Cognition  Overall Cognitive Status: Appears within functional limits for tasks assessed/performed Arousal/Alertness: Awake/alert Orientation Level: Appears intact for tasks assessed Behavior During Session: [facility] for tasks performed    Balance     End of Session PT - End of Session Activity Tolerance: Patient tolerated treatment well Patient left: in bed;with call bell/phone within reach Nurse Communication: Mobility status   GP     Natalie Ho 04/05/2012, 1:11 PM

## 2012-04-05 NOTE — Progress Notes (Signed)
Physical Therapy Treatment Patient Details Name: Natalie Ho MRN: [id number] DOB: Aug 09, 1930 Today's Date: 04/05/2012 Time: 7829-5621 PT Time Calculation (min): 14 min  PT Assessment / Plan / Recommendation Comments on Treatment Session  Reviewed car transfers with pt and spouse    Follow Up Recommendations  Home health PT     Does the patient have the potential to tolerate intense rehabilitation     Barriers to Discharge        Equipment Recommendations  None recommended by PT    Recommendations for Other Services OT consult  Frequency 7X/week   Plan Discharge plan remains appropriate    Precautions / Restrictions Precautions Precautions: None Restrictions Weight Bearing Restrictions: No Other Position/Activity Restrictions: Direct anterior. Noprecautions   Pertinent Vitals/Pain 4/10    Mobility  Bed Mobility Bed Mobility: Supine to Sit Transfers Transfers: Sit to Stand;Stand to Sit Sit to Stand: 5: Supervision Stand to Sit: 5: Supervision Details for Transfer Assistance: cues for use of UEs to self assist and for LE management Ambulation/Gait Ambulation/Gait Assistance: 4: Min guard;5: Supervision Ambulation Distance (Feet): 150 Feet Assistive device: Rolling walker Ambulation/Gait Assistance Details: cues for posture and position from RW Gait Pattern: Step-to pattern;Step-through pattern Stairs: Yes Stairs Assistance: 4: Min assist Stairs Assistance Details (indicate cue type and reason): cues for sequence and foot/RW placement Stair Management Technique: No rails;Backwards;Step to pattern;With walker Number of Stairs: 2     Exercises     PT Diagnosis:    PT Problem List:   PT Treatment Interventions:     PT Goals Acute Rehab PT Goals PT Goal Formulation: With patient Time For Goal Achievement: 04/10/12 Potential to Achieve Goals: Good Pt will go Supine/Side to Sit: with supervision PT Goal: Supine/Side to Sit - Progress: Met Pt will go Sit to  Supine/Side: with supervision PT Goal: Sit to Supine/Side - Progress: Met Pt will go Sit to Stand: with supervision PT Goal: Sit to Stand - Progress: Met Pt will go Stand to Sit: with supervision PT Goal: Stand to Sit - Progress: Met Pt will Ambulate: 51 - 150 feet;with supervision;with rolling walker PT Goal: Ambulate - Progress: Met Pt will Go Up / Down Stairs: 1-2 stairs;with min assist;with least restrictive assistive device PT Goal: Up/Down Stairs - Progress: Met  Visit Information  Last PT Received On: 04/05/12 Assistance Needed: +1    Subjective Data  Subjective: I'm ready to go home Patient Stated Goal: Resume previous lifestyle with decreased pain   Cognition  Overall Cognitive Status: Appears within functional limits for tasks assessed/performed Arousal/Alertness: Awake/alert Orientation Level: Appears intact for tasks assessed Behavior During Session: [facility] for tasks performed    Balance     End of Session     GP     Osvaldo Lamping 04/05/2012, 4:36 PM

## 2012-04-05 NOTE — Progress Notes (Signed)
Pt to d/c home with [facility]. AVS reviewed and "My Chart" discussed with pt. Pt capable of verbalizing medications and follow-up appointments. Remains hemodynamically stable. No signs and symptoms of distress. Educated pt to return to ER in the case of SOB, dizziness, or chest pain.  

## 2012-04-05 NOTE — Discharge Summary (Signed)
Physician Discharge Summary   Patient ID: Natalie Ho MRN: [id number] DOB/AGE: Aug 03, 1930 80 y.o.  Admit date: 04/03/2012 Discharge date: 04/05/2012   Primary Diagnosis:  Osteoarthritis of the Left hip.   Admission Diagnoses:  Past Medical History  Diagnosis Date  . Atrial fibrillation   . Aortic insufficiency   . Hypertension   . Hyperlipidemia   . SOB (shortness of breath)   . Headache   . Gout   . GERD (gastroesophageal reflux disease)   . Colitis   . Arthritis   . Anxiety   . Chronic anticoagulation   . Stroke     TIA-3'08, none recent  . Melanoma of nose     "was only a precancer area"-no problems since   Discharge Diagnoses:   Principal Problem:  *OA (osteoarthritis) of hip Active Problems:  Postop Acute blood loss anemia  Postop Hyponatremia  Estimated Body mass index is 22.08 kg/(m^2) as calculated from the following:   Height as of this encounter: 5\' 7" (1.702 m).   Weight as of this encounter: 141 lb(63.957 kg).  Classification of overweight in adults according to BMI (WHO, 1998)   Procedure: Procedure(s) (LRB): TOTAL HIP ARTHROPLASTY ANTERIOR APPROACH (Left)   Consults: None  HPI: Natalie Ho is a 79 y.o. female who has advanced end-  stage arthritis of his Left hip with progressively worsening pain and  dysfunction.The patient has failed nonoperative management and presents for  total hip arthroplasty. Radiographs demonstrate bone on one arthritis with subchondral cyst formation.  Laboratory Data: Admission on 04/03/2012  Component Date Value Range Status  . Prothrombin Time 04/03/2012 13.4  11.6 - 15.2 seconds Final  . INR 04/03/2012 1.03  0.00 - 1.49 Final  . ABO/RH(D) 04/03/2012 O NEG   Final  . Antibody Screen 04/03/2012 NEG   Final  . Sample Expiration 04/03/2012 04/06/2012   Final  . ABO/RH(D) 04/03/2012 O NEG   Final  . WBC 04/04/2012 5.2  4.0 - 10.5 K/uL Final  . RBC 04/04/2012 3.07* 3.87 - 5.11 MIL/uL Final  . Hemoglobin  04/04/2012 9.3* 12.0 - 15.0 g/dL Final  . HCT 41/32/4401 27.4* 36.0 - 46.0 % Final  . MCV 04/04/2012 89.3  78.0 - 100.0 fL Final  . MCH 04/04/2012 30.3  26.0 - 34.0 pg Final  . MCHC 04/04/2012 33.9  30.0 - 36.0 g/dL Final  . RDW 02/72/5366 14.2  11.5 - 15.5 % Final  . Platelets 04/04/2012 187  150 - 400 K/uL Final  . Sodium 04/04/2012 128* 135 - 145 mEq/L Final  . Potassium 04/04/2012 4.5  3.5 - 5.1 mEq/L Final  . Chloride 04/04/2012 95* 96 - 112 mEq/L Final  . CO2 04/04/2012 30  19 - 32 mEq/L Final  . Glucose, Bld 04/04/2012 170* 70 - 99 mg/dL Final  . BUN 44/05/4740 11  6 - 23 mg/dL Final  . Creatinine, Ser 04/04/2012 0.58  0.50 - 1.10 mg/dL Final  . Calcium 59/56/3875 8.1* 8.4 - 10.5 mg/dL Final  . GFR calc non Af Amer 04/04/2012 85* >90 mL/min Final  . GFR calc Af Amer 04/04/2012 >90  >90 mL/min Final   Comment:                                 The eGFR has been calculated  using the CKD EPI equation.                          This calculation has not been                          validated in all clinical                          situations.                          eGFR's persistently                          <90 mL/min signify                          possible Chronic Kidney Disease.  Marland Kitchen Prothrombin Time 04/04/2012 14.9  11.6 - 15.2 seconds Final  . INR 04/04/2012 1.19  0.00 - 1.49 Final  . WBC 04/05/2012 7.8  4.0 - 10.5 K/uL Final  . RBC 04/05/2012 2.93* 3.87 - 5.11 MIL/uL Final  . Hemoglobin 04/05/2012 8.8* 12.0 - 15.0 g/dL Final  . HCT 16/12/9602 26.1* 36.0 - 46.0 % Final  . MCV 04/05/2012 89.1  78.0 - 100.0 fL Final  . MCH 04/05/2012 30.0  26.0 - 34.0 pg Final  . MCHC 04/05/2012 33.7  30.0 - 36.0 g/dL Final  . RDW 54/11/8117 14.4  11.5 - 15.5 % Final  . Platelets 04/05/2012 210  150 - 400 K/uL Final  . Sodium 04/05/2012 131* 135 - 145 mEq/L Final  . Potassium 04/05/2012 4.2  3.5 - 5.1 mEq/L Final  . Chloride 04/05/2012 99  96 - 112 mEq/L Final    . CO2 04/05/2012 28  19 - 32 mEq/L Final  . Glucose, Bld 04/05/2012 138* 70 - 99 mg/dL Final  . BUN 14/78/2956 10  6 - 23 mg/dL Final  . Creatinine, Ser 04/05/2012 0.58  0.50 - 1.10 mg/dL Final  . Calcium 21/30/8657 8.5  8.4 - 10.5 mg/dL Final  . GFR calc non Af Amer 04/05/2012 85* >90 mL/min Final  . GFR calc Af Amer 04/05/2012 >90  >90 mL/min Final   Comment:                                 The eGFR has been calculated                          using the CKD EPI equation.                          This calculation has not been                          validated in all clinical                          situations.                          eGFR's persistently                          <  90 mL/min signify                          possible Chronic Kidney Disease.  Marland Kitchen Prothrombin Time 04/05/2012 18.5* 11.6 - 15.2 seconds Final  . INR 04/05/2012 1.59* 0.00 - 1.49 Final  Hospital Outpatient Visit on 03/28/2012  Component Date Value Range Status  . MRSA, PCR 03/28/2012 NEGATIVE  NEGATIVE Final  . Staphylococcus aureus 03/28/2012 NEGATIVE  NEGATIVE Final   Comment:                                 The Xpert SA Assay (FDA                          approved for NASAL specimens                          in patients over 45 years of age),                          is one component of                          a comprehensive surveillance                          program.  Test performance has                          been validated by [employer] for patients greater                          than or equal to 46 year old.                          It is not intended                          to diagnose infection nor to                          guide or monitor treatment.  Marland Kitchen aPTT 03/28/2012 41* 24 - 37 seconds Final   Comment:                                 IF BASELINE aPTT IS ELEVATED,                          SUGGEST PATIENT RISK ASSESSMENT                          BE  USED TO DETERMINE APPROPRIATE                          ANTICOAGULANT THERAPY.  . WBC 03/28/2012 5.6  4.0 - 10.5 K/uL Final  . RBC 03/28/2012  4.83  3.87 - 5.11 MIL/uL Final  . Hemoglobin 03/28/2012 14.6  12.0 - 15.0 g/dL Final  . HCT 98/01/9146 43.0  36.0 - 46.0 % Final  . MCV 03/28/2012 89.0  78.0 - 100.0 fL Final  . MCH 03/28/2012 30.2  26.0 - 34.0 pg Final  . MCHC 03/28/2012 34.0  30.0 - 36.0 g/dL Final  . RDW 82/95/6213 14.4  11.5 - 15.5 % Final  . Platelets 03/28/2012 258  150 - 400 K/uL Final  . Sodium 03/28/2012 131* 135 - 145 mEq/L Final  . Potassium 03/28/2012 4.8  3.5 - 5.1 mEq/L Final  . Chloride 03/28/2012 93* 96 - 112 mEq/L Final  . CO2 03/28/2012 30  19 - 32 mEq/L Final  . Glucose, Bld 03/28/2012 95  70 - 99 mg/dL Final  . BUN 08/65/7846 16  6 - 23 mg/dL Final  . Creatinine, Ser 03/28/2012 0.72  0.50 - 1.10 mg/dL Final  . Calcium 96/29/5284 9.3  8.4 - 10.5 mg/dL Final  . Total Protein 03/28/2012 7.3  6.0 - 8.3 g/dL Final  . Albumin 13/24/4010 3.8  3.5 - 5.2 g/dL Final  . AST 27/25/3664 26  0 - 37 U/L Final  . ALT 03/28/2012 22  0 - 35 U/L Final  . Alkaline Phosphatase 03/28/2012 90  39 - 117 U/L Final  . Total Bilirubin 03/28/2012 0.6  0.3 - 1.2 mg/dL Final  . GFR calc non Af Amer 03/28/2012 80* >90 mL/min Final  . GFR calc Af Amer 03/28/2012 >90  >90 mL/min Final   Comment:                                 The eGFR has been calculated                          using the CKD EPI equation.                          This calculation has not been                          validated in all clinical                          situations.                          eGFR's persistently                          <90 mL/min signify                          possible Chronic Kidney Disease.  Marland Kitchen Prothrombin Time 03/28/2012 18.7* 11.6 - 15.2 seconds Final  . INR 03/28/2012 1.62* 0.00 - 1.49 Final  . Color, Urine 03/28/2012 YELLOW  YELLOW Final  . APPearance 03/28/2012 CLEAR  CLEAR Final   . Specific Gravity, Urine 03/28/2012 1.020  1.005 - 1.030 Final  . pH 03/28/2012 6.5  5.0 - 8.0 Final  . Glucose, UA 03/28/2012 NEGATIVE  NEGATIVE mg/dL Final  . Hgb urine dipstick 03/28/2012 NEGATIVE  NEGATIVE Final  . Bilirubin Urine 03/28/2012 NEGATIVE  NEGATIVE Final  . Ketones, ur 03/28/2012 NEGATIVE  NEGATIVE mg/dL Final  . Protein, ur 78/29/5621 NEGATIVE  NEGATIVE mg/dL Final  . Urobilinogen, UA 03/28/2012 1.0  0.0 - 1.0 mg/dL Final  . Nitrite 30/86/5784 NEGATIVE  NEGATIVE Final  . Leukocytes, UA 03/28/2012 SMALL* NEGATIVE Final  . Squamous Epithelial / LPF 03/28/2012 RARE  RARE Final  . WBC, UA 03/28/2012 0-2  <3 WBC/hpf Final  . Bacteria, UA 03/28/2012 FEW* RARE Final  . Urine-Other 03/28/2012 MUCOUS PRESENT   Final  Anti-coag visit on 02/27/2012  Component Date Value Range Status  . INR 02/27/2012 3.0   Final  . Sodium 02/27/2012 133* 135 - 145 mEq/L Final  . Potassium 02/27/2012 4.5  3.5 - 5.1 mEq/L Final  . Chloride 02/27/2012 98  96 - 112 mEq/L Final  . CO2 02/27/2012 29  19 - 32 mEq/L Final  . Glucose, Bld 02/27/2012 111* 70 - 99 mg/dL Final  . BUN 69/62/9528 15  6 - 23 mg/dL Final  . Creatinine, Ser 02/27/2012 0.7  0.4 - 1.2 mg/dL Final  . Calcium 41/32/4401 9.0  8.4 - 10.5 mg/dL Final  . GFR 02/72/5366 80.31  >60.00 mL/min Final     X-Rays:Dg Hip Complete Left  03/28/2012  *RADIOLOGY REPORT*  Clinical Data: Preoperative assessment for left total hip replacement  LEFT HIP - COMPLETE 2+ VIEW  Comparison: None  Findings: Osseous demineralization. Osteoarthritic changes of left hip joint with joint space narrowing and minimal subchondral sclerosis. Old pin tracks versus prior core decompression tracts within proximal left femur. Minimal narrowing of right hip joint. Degenerative disc disease changes lower lumbar spine. No acute fracture, dislocation or bone destruction.  IMPRESSION: Osteoarthritic changes left hip joint. Prior surgery proximal left femur.   Original Report  Authenticated By: Ulyses Southward, M.D.    Dg Pelvis Portable  04/03/2012  *RADIOLOGY REPORT*  Clinical Data: Left hip pain due to osteoarthritis  DG C-ARM 1-60 MIN - NRPT [facility],PORTABLE PELVIS,PORTABLE LEFT HIP - 1 VIEW  Comparison: 03/28/2012  Findings: The patient is status post anterior approach left total hip arthroplasty.  Satisfactory appearance and location of the femoral and acetabular components.  Surgical drain good position.  IMPRESSION: Status post anterior approach left T H R.   Original Report Authenticated By: Davonna Belling, M.D.    Dg Hip Portable 1 View Left  04/03/2012  *RADIOLOGY REPORT*  Clinical Data: Left hip pain due to osteoarthritis  DG C-ARM 1-60 MIN - NRPT [facility],PORTABLE PELVIS,PORTABLE LEFT HIP - 1 VIEW  Comparison: 03/28/2012  Findings: The patient is status post anterior approach left total hip arthroplasty.  Satisfactory appearance and location of the femoral and acetabular components.  Surgical drain good position.  IMPRESSION: Status post anterior approach left T H R.   Original Report Authenticated By: Davonna Belling, M.D.    Dg Knee Complete 4 Views Right  03/31/2012  *RADIOLOGY REPORT*  Clinical Data: Fall 1 week ago.  Pain for 2 days.  History multiple prior injuries.  RIGHT KNEE - COMPLETE 4+ VIEW  Comparison: None.  Findings: Moderate prepatellar soft tissue swelling. No acute fracture or dislocation.  No joint effusion.  Vascular calcifications.  Joint spaces maintained for age.  IMPRESSION: Prepatellar soft tissue swelling, without acute osseous abnormality.   Original Report Authenticated By: Jeronimo Greaves, M.D.    Dg C-arm 1-60 Min-no Report  04/03/2012  *RADIOLOGY REPORT*  Clinical Data: Left hip pain due to osteoarthritis  DG C-ARM 1-60 MIN - NRPT [facility],PORTABLE PELVIS,PORTABLE LEFT HIP - 1 VIEW  Comparison: 03/28/2012  Findings: The  patient is status post anterior approach left total hip arthroplasty.  Satisfactory appearance and location of the femoral and acetabular  components.  Surgical drain good position.  IMPRESSION: Status post anterior approach left T H R.   Original Report Authenticated By: Davonna Belling, M.D.     EKG: Orders placed in visit on 05/16/11  . EKG 12-LEAD     Hospital Course: Patient was admitted to [facility] and taken to the OR and underwent the above state procedure without complications.  Patient tolerated the procedure well and was later transferred to the recovery room and then to the orthopaedic floor for postoperative care.  They were given PO and IV analgesics for pain control following their surgery.  They were given 24 hours of postoperative antibiotics of  Anti-infectives     Start     Dose/Rate Route Frequency Ordered Stop   04/03/12 2000   ceFAZolin (ANCEF) IVPB 1 g/50 mL premix        1 g 100 mL/hr over 30 Minutes Intravenous Every 6 hours 04/03/12 1630 04/04/12 0227   04/03/12 1137   ceFAZolin (ANCEF) IVPB 2 g/50 mL premix        2 g 100 mL/hr over 30 Minutes Intravenous 60 min pre-op 04/03/12 1137 04/03/12 1301         and started on DVT prophylaxis in the form of Lovenox and Coumadin.   PT and OT were ordered for total hip protocol.  The patient was allowed to be WBAT with therapy. Discharge planning was consulted to help with postop disposition and equipment needs.  Patient had a good night on the evening of surgery and started to get up OOB with therapy on day one and walked 15 feet and then over 100 feet.  Hemovac drain was pulled without difficulty.  The knee immobilizer was removed and discontinued.  Continued to work with therapy into day two.  Dressing was changed on day two and the incision was healing well.  Patient was seen in rounds and was ready to go home later that same day on POD 2.   Discharge Medications: Prior to Admission medications   Medication Sig Start Date End Date Taking? Authorizing Provider  amLODipine (NORVASC) 2.5 MG tablet Take 2.5 mg by mouth every evening.   Yes Historical  Provider, MD  atorvastatin (LIPITOR) 20 MG tablet Take 20 mg by mouth at bedtime.   Yes Historical Provider, MD  digoxin (LANOXIN) 0.125 MG tablet Take 125 mcg by mouth every morning.    Yes Historical Provider, MD  nebivolol (BYSTOLIC) 10 MG tablet Take 10 mg by mouth every morning.   Yes Historical Provider, MD  omeprazole (PRILOSEC) 20 MG capsule Take 20 mg by mouth every morning.    Yes Historical Provider, MD  sertraline (ZOLOFT) 50 MG tablet Take 50 mg by mouth every morning.    Yes Historical Provider, MD  enoxaparin (LOVENOX) 40 MG/0.4ML injection Inject 0.4 mLs (40 mg total) into the skin daily. 04/05/12   Alexzandrew Perkins, PA  EPIPEN 2-PAK 0.3 MG/0.3ML DEVI Inject 0.3 mg into the muscle as needed. As needed for allergic reaction. 03/02/11   Historical Provider, MD  iron polysaccharides (NIFEREX) 150 MG capsule Take 1 capsule (150 mg total) by mouth daily. 04/05/12   Alexzandrew Julien Girt, PA  methocarbamol (ROBAXIN) 500 MG tablet Take 1 tablet (500 mg total) by mouth every 6 (six) hours as needed. 04/05/12   Alexzandrew Julien Girt, PA  nystatin-triamcinolone (MYCOLOG II) cream Apply 1 application  topically daily as needed. On fungus    Historical Provider, MD  oxyCODONE (OXY IR/ROXICODONE) 5 MG immediate release tablet Take 1-2 tablets (5-10 mg total) by mouth every 3 (three) hours as needed. 04/05/12   Alexzandrew Julien Girt, PA  warfarin (COUMADIN) 5 MG tablet Take 1 tablet (5 mg total) by mouth one time only at 6 PM. Take Coumadin for three weeks for postoperative protocol and then the patient may resume their previous Coumadin home regimen.  The dose may need to be adjusted based upon the INR.  Please follow the INR and titrate Coumadin dose for a therapeutic range between 2.0 and 3.0 INR.  After completing the three weeks of Coumadin, the patient may resume their previous Coumadin home regimen. 04/05/12   Alexzandrew Julien Girt, PA    Diet: Cardiac diet Activity:WBAT No bending hip over 90  degrees- A "L" Angle Do not cross legs Do not let foot roll inward When turning these patients a pillow should be placed between the patient's legs to prevent crossing. Patients should have the affected knee fully extended when trying to sit or stand from all surfaces to prevent excessive hip flexion. When ambulating and turning toward the affected side the affected leg should have the toes turned out prior to moving the walker and the rest of patient's body as to prevent internal rotation/ turning in of the leg. Abduction pillows are the most effective way to prevent a patient from not crossing legs or turning toes in at rest. If an abduction pillow is not ordered placing a regular pillow length wise between the patient's legs is also an effective reminder. It is imperative that these precautions be maintained so that the surgical hip does not dislocate. Follow-up:in 2 weeks Disposition - Home Discharged Condition: good   Discharge Orders    Future Appointments: Provider: Department: Dept Phone: Center:   01/27/2013 10:15 AM Waymon Budge, MD  Pulmonary Care [phone] None     Future Orders Please Complete By Expires   Diet - low sodium heart healthy      Call MD / Call 911      Comments:   If you experience chest pain or shortness of breath, CALL 911 and be transported to the hospital emergency room.  If you develope a fever above 101 F, pus (white drainage) or increased drainage or redness at the wound, or calf pain, call your surgeon's office.   Discharge instructions      Comments:   Pick up stool softner and laxative for home. Do not submerge incision under water. May shower. Continue to use ice for pain and swelling from surgery. Hip precautions.  Total Hip Protocol.  Take Coumadin for three weeks for postoperative protocol and then the patient may resume their previous Coumadin home regimen.  The dose may need to be adjusted based upon the INR.  Please follow the  INR and titrate Coumadin dose for a therapeutic range between 2.0 and 3.0 INR.  After completing the three weeks of Coumadin, the patient may resume their previous Coumadin home regimen.  Continue Lovenox injections until the INR is therapeutic at or greater than 2.0.  When INR reaches the therapeutic level of equal to or greater than 2.0, the patient may discontinue the Lovenox injections.   Constipation Prevention      Comments:   Drink plenty of fluids.  Prune juice may be helpful.  You may use a stool softener, such as Colace (over the counter) 100 mg twice a  day.  Use MiraLax (over the counter) for constipation as needed.   Increase activity slowly as tolerated      Patient may shower      Comments:   You may shower without a dressing once there is no drainage.  Do not wash over the wound.  If drainage remains, do not shower until drainage stops.   Weight bearing as tolerated      Driving restrictions      Comments:   No driving until released by the physician.   Lifting restrictions      Comments:   No lifting until released by the physician.   Follow the hip precautions as taught in Physical Therapy      Change dressing      Comments:   You may change your dressing dressing daily with sterile 4 x 4 inch gauze dressing and paper tape.  Do not submerge the incision under water.   TED hose      Comments:   Use stockings (TED hose) for 3 weeks on both leg(s).  You may remove them at night for sleeping.   Do not sit on low chairs, stoools or toilet seats, as it may be difficult to get up from low surfaces          Medication List     As of 04/05/2012  9:12 AM    TAKE these medications         amLODipine 2.5 MG tablet   Commonly known as: NORVASC   Take 2.5 mg by mouth every evening.      atorvastatin 20 MG tablet   Commonly known as: LIPITOR   Take 20 mg by mouth at bedtime.      digoxin 0.125 MG tablet   Commonly known as: LANOXIN   Take 125 mcg by mouth every morning.        enoxaparin 40 MG/0.4ML injection   Commonly known as: LOVENOX   Inject 0.4 mLs (40 mg total) into the skin daily.      EPIPEN 2-PAK 0.3 mg/0.3 mL Devi   Generic drug: EPINEPHrine   Inject 0.3 mg into the muscle as needed. As needed for allergic reaction.      iron polysaccharides 150 MG capsule   Commonly known as: NIFEREX   Take 1 capsule (150 mg total) by mouth daily.      methocarbamol 500 MG tablet   Commonly known as: ROBAXIN   Take 1 tablet (500 mg total) by mouth every 6 (six) hours as needed.      nebivolol 10 MG tablet   Commonly known as: BYSTOLIC   Take 10 mg by mouth every morning.      nystatin-triamcinolone cream   Commonly known as: MYCOLOG II   Apply 1 application topically daily as needed. On fungus      omeprazole 20 MG capsule   Commonly known as: PRILOSEC   Take 20 mg by mouth every morning.      oxyCODONE 5 MG immediate release tablet   Commonly known as: Oxy IR/ROXICODONE   Take 1-2 tablets (5-10 mg total) by mouth every 3 (three) hours as needed.      sertraline 50 MG tablet   Commonly known as: ZOLOFT   Take 50 mg by mouth every morning.      warfarin 5 MG tablet   Commonly known as: COUMADIN   Take 1 tablet (5 mg total) by mouth one time only at 6 PM. Take Coumadin for  three weeks for postoperative protocol and then the patient may resume their previous Coumadin home regimen.  The dose may need to be adjusted based upon the INR.  Please follow the INR and titrate Coumadin dose for a therapeutic range between 2.0 and 3.0 INR.  After completing the three weeks of Coumadin, the patient may resume their previous Coumadin home regimen.           Follow-up Information    Follow up with Loanne Drilling, MD. Schedule an appointment as soon as possible for a visit in 2 weeks.   Contact information:   [address] [place] [address] [phone]          Signed: Patrica Duel 04/05/2012, 9:12 AM

## 2012-04-07 LAB — POCT INR: INR: 2.2

## 2012-04-08 ENCOUNTER — Ambulatory Visit: Payer: Self-pay | Admitting: Internal Medicine

## 2012-04-08 DIAGNOSIS — I4891 Unspecified atrial fibrillation: Secondary | ICD-10-CM

## 2012-04-10 ENCOUNTER — Other Ambulatory Visit: Payer: Self-pay | Admitting: *Deleted

## 2012-04-10 MED ORDER — ATORVASTATIN CALCIUM 20 MG PO TABS: 20.0000 mg | ORAL_TABLET | Freq: Every day | ORAL | Status: DC

## 2012-04-10 NOTE — Telephone Encounter (Signed)
Fax Received. Refill Completed. Natalie Ho (R.M.A)   

## 2012-04-12 ENCOUNTER — Ambulatory Visit: Payer: Self-pay | Admitting: Cardiology

## 2012-04-12 DIAGNOSIS — I4891 Unspecified atrial fibrillation: Secondary | ICD-10-CM

## 2012-04-12 LAB — POCT INR: INR: 2

## 2012-04-18 ENCOUNTER — Telehealth: Payer: Self-pay | Admitting: Internal Medicine

## 2012-04-18 NOTE — Telephone Encounter (Signed)
Per CY-okay to add to TP's schedule. Pt will be here at 10:30am to see TP.

## 2012-04-18 NOTE — Telephone Encounter (Signed)
I spoke with pt. C/o being very SOB-gasping for air at times x 10 days now getting worse-cough w/ very very little mucus production, and wheezing. Denies any chest tx. She is not on oxygen. She is requesting to be worked in with Dr. Maple Hudson tomorrow. Please advise thanks  Last OV 02/05/12

## 2012-04-19 ENCOUNTER — Encounter: Payer: Self-pay | Admitting: Adult Health

## 2012-04-19 ENCOUNTER — Ambulatory Visit (INDEPENDENT_AMBULATORY_CARE_PROVIDER_SITE_OTHER)
Admission: RE | Admit: 2012-04-19 | Discharge: 2012-04-19 | Disposition: A | Discharge status: Home / Self Care | Payer: Medicare Other | Source: Ambulatory Visit | Attending: Adult Health | Admitting: Adult Health

## 2012-04-19 ENCOUNTER — Encounter (HOSPITAL_COMMUNITY): Payer: Self-pay | Admitting: Emergency Medicine

## 2012-04-19 ENCOUNTER — Ambulatory Visit (INDEPENDENT_AMBULATORY_CARE_PROVIDER_SITE_OTHER): Payer: Medicare Other | Admitting: Adult Health

## 2012-04-19 ENCOUNTER — Other Ambulatory Visit: Payer: Self-pay | Admitting: Adult Health

## 2012-04-19 ENCOUNTER — Other Ambulatory Visit (INDEPENDENT_AMBULATORY_CARE_PROVIDER_SITE_OTHER): Payer: Medicare Other

## 2012-04-19 ENCOUNTER — Telehealth: Payer: Self-pay | Admitting: Cardiology

## 2012-04-19 ENCOUNTER — Inpatient Hospital Stay (HOSPITAL_COMMUNITY)
Admission: EM | Admit: 2012-04-19 | Discharge: 2012-04-21 | DRG: 293 | Disposition: A | Discharge status: Home / Self Care | Payer: Medicare Other | Attending: Internal Medicine | Admitting: Internal Medicine

## 2012-04-19 VITALS — BP 118/62 | HR 57 | Temp 97.0°F | Ht 67.0 in | Wt 155.6 lb

## 2012-04-19 DIAGNOSIS — I5031 Acute diastolic (congestive) heart failure: Principal | ICD-10-CM | POA: Diagnosis present

## 2012-04-19 DIAGNOSIS — I10 Essential (primary) hypertension: Secondary | ICD-10-CM

## 2012-04-19 DIAGNOSIS — I97131 Postprocedural heart failure following other surgery: Secondary | ICD-10-CM

## 2012-04-19 DIAGNOSIS — R05 Cough: Secondary | ICD-10-CM

## 2012-04-19 DIAGNOSIS — E785 Hyperlipidemia, unspecified: Secondary | ICD-10-CM

## 2012-04-19 DIAGNOSIS — K219 Gastro-esophageal reflux disease without esophagitis: Secondary | ICD-10-CM

## 2012-04-19 DIAGNOSIS — I509 Heart failure, unspecified: Secondary | ICD-10-CM | POA: Diagnosis present

## 2012-04-19 DIAGNOSIS — K5289 Other specified noninfective gastroenteritis and colitis: Secondary | ICD-10-CM

## 2012-04-19 DIAGNOSIS — E871 Hypo-osmolality and hyponatremia: Secondary | ICD-10-CM

## 2012-04-19 DIAGNOSIS — R899 Unspecified abnormal finding in specimens from other organs, systems and tissues: Secondary | ICD-10-CM

## 2012-04-19 DIAGNOSIS — M810 Age-related osteoporosis without current pathological fracture: Secondary | ICD-10-CM

## 2012-04-19 DIAGNOSIS — D62 Acute posthemorrhagic anemia: Secondary | ICD-10-CM

## 2012-04-19 DIAGNOSIS — Z96649 Presence of unspecified artificial hip joint: Secondary | ICD-10-CM

## 2012-04-19 DIAGNOSIS — J45909 Unspecified asthma, uncomplicated: Secondary | ICD-10-CM

## 2012-04-19 DIAGNOSIS — E8770 Fluid overload, unspecified: Secondary | ICD-10-CM

## 2012-04-19 DIAGNOSIS — Z7901 Long term (current) use of anticoagulants: Secondary | ICD-10-CM

## 2012-04-19 DIAGNOSIS — J984 Other disorders of lung: Secondary | ICD-10-CM

## 2012-04-19 DIAGNOSIS — I4891 Unspecified atrial fibrillation: Secondary | ICD-10-CM

## 2012-04-19 DIAGNOSIS — R0989 Other specified symptoms and signs involving the circulatory and respiratory systems: Secondary | ICD-10-CM

## 2012-04-19 DIAGNOSIS — R0609 Other forms of dyspnea: Secondary | ICD-10-CM

## 2012-04-19 DIAGNOSIS — Z79899 Other long term (current) drug therapy: Secondary | ICD-10-CM

## 2012-04-19 DIAGNOSIS — Z8673 Personal history of transient ischemic attack (TIA), and cerebral infarction without residual deficits: Secondary | ICD-10-CM

## 2012-04-19 DIAGNOSIS — F411 Generalized anxiety disorder: Secondary | ICD-10-CM | POA: Diagnosis present

## 2012-04-19 DIAGNOSIS — M109 Gout, unspecified: Secondary | ICD-10-CM

## 2012-04-19 DIAGNOSIS — M169 Osteoarthritis of hip, unspecified: Secondary | ICD-10-CM

## 2012-04-19 DIAGNOSIS — R059 Cough, unspecified: Secondary | ICD-10-CM

## 2012-04-19 DIAGNOSIS — Z87891 Personal history of nicotine dependence: Secondary | ICD-10-CM

## 2012-04-19 DIAGNOSIS — C4330 Malignant melanoma of unspecified part of face: Secondary | ICD-10-CM

## 2012-04-19 DIAGNOSIS — G459 Transient cerebral ischemic attack, unspecified: Secondary | ICD-10-CM

## 2012-04-19 DIAGNOSIS — R635 Abnormal weight gain: Secondary | ICD-10-CM | POA: Diagnosis present

## 2012-04-19 LAB — CBC WITH DIFFERENTIAL/PLATELET
Basophils Absolute: 0.1 10*3/uL (ref 0.0–0.1)
Eosinophils Absolute: 0.1 10*3/uL (ref 0.0–0.7)
Hemoglobin: 9.9 g/dL — ABNORMAL LOW (ref 12.0–15.0)
Lymphocytes Relative: 10.9 % — ABNORMAL LOW (ref 12.0–46.0)
MCHC: 32.3 g/dL (ref 30.0–36.0)
Monocytes Relative: 9.5 % (ref 3.0–12.0)
Neutro Abs: 3.4 10*3/uL (ref 1.4–7.7)
Neutrophils Relative %: 75.1 % (ref 43.0–77.0)
RBC: 3.37 Mil/uL — ABNORMAL LOW (ref 3.87–5.11)
RDW: 16.5 % — ABNORMAL HIGH (ref 11.5–14.6)

## 2012-04-19 LAB — BASIC METABOLIC PANEL
CO2: 29 mEq/L (ref 19–32)
Calcium: 8.8 mg/dL (ref 8.4–10.5)
Chloride: 99 mEq/L (ref 96–112)
Creatinine, Ser: 0.6 mg/dL (ref 0.4–1.2)
GFR calc Af Amer: >90 mL/min (ref >90)
GFR calc non Af Amer: 82 mL/min — ABNORMAL LOW (ref >90)
Glucose, Bld: 108 mg/dL — ABNORMAL HIGH (ref 70–99)
Potassium: 4.3 mEq/L (ref 3.5–5.1)
Potassium: 4.1 mEq/L (ref 3.5–5.1)
Sodium: 132 mEq/L — ABNORMAL LOW (ref 135–145)

## 2012-04-19 LAB — BLOOD GAS, ARTERIAL
FIO2: 0.21 %
O2 Saturation: 94.4 %
Patient temperature: 98.6
pH, Arterial: 7.439 (ref 7.350–7.450)

## 2012-04-19 LAB — CBC
Hemoglobin: 9.7 g/dL — ABNORMAL LOW (ref 12.0–15.0)
MCH: 29 pg (ref 26.0–34.0)
Platelets: 413 10*3/uL — ABNORMAL HIGH (ref 150–400)
RBC: 3.34 MIL/uL — ABNORMAL LOW (ref 3.87–5.11)
WBC: 5.2 10*3/uL (ref 4.0–10.5)

## 2012-04-19 LAB — PRO B NATRIURETIC PEPTIDE: Pro B Natriuretic peptide (BNP): 2490 pg/mL — ABNORMAL HIGH (ref 0–450)

## 2012-04-19 LAB — PROTIME-INR
INR: 3 ratio — ABNORMAL HIGH (ref 0.8–1.0)
Prothrombin Time: 30.9 s — ABNORMAL HIGH (ref 10.2–12.4)

## 2012-04-19 LAB — BRAIN NATRIURETIC PEPTIDE: Pro B Natriuretic peptide (BNP): 322 pg/mL — ABNORMAL HIGH (ref 0.0–100.0)

## 2012-04-19 MED ORDER — FUROSEMIDE 20 MG PO TABS: 20.0000 mg | ORAL_TABLET | Freq: Every day | ORAL | Status: DC | PRN

## 2012-04-19 MED ORDER — FUROSEMIDE 10 MG/ML IJ SOLN
40.0000 mg | Freq: Once | INTRAMUSCULAR | Status: AC
  Administered 2012-04-20: 40 mg via INTRAVENOUS
  Filled 2012-04-19 (×2): qty 4

## 2012-04-19 NOTE — ED Notes (Signed)
Pt c/o shob, seen at PCP today for same, put on Lasix for fluid buildup. Pt worse after returning home. Pt states SHOB began after hip replacement 04/03/12.

## 2012-04-19 NOTE — Progress Notes (Signed)
Quick Note:  Called spoke with patient, advised of lab results / recs as stated by TP. Pt verbalized her understanding and denied any questions. Results forwarded to CC. Orders only encounter created for cards referral; will discuss w/ CY's nurse about work-in appt for next week. ______

## 2012-04-19 NOTE — ED Provider Notes (Signed)
History     CSN: [id number]  Arrival date & time 04/19/12  2029   First MD Initiated Contact with Patient 04/19/12 2145      Chief Complaint  Patient presents with  . Shortness of Breath    l HPI Comments: Pt was admitted to the hospital on Jan 22 for hip replacement.  She left the hospital on the 24th.  They recommended a rehab facility.  They opted to go home.  Pt has been having a lot stress at home and she feels that it is affecting her breathing.  She wants to be admitted to a nursing home now.   Patient is a 79 y.o. female presenting with shortness of breath. The history is provided by the patient.  Shortness of Breath  The current episode started more than 2 weeks ago. Onset quality: it improved ,  then returned. Associated symptoms include shortness of breath. Pertinent negatives include no chest pain and no fever.  It is worse in the AM.  Not at night.  Increases with exertion.  She has noticed increased swelling in her legs.  Past Medical History  Diagnosis Date  . Atrial fibrillation   . Aortic insufficiency   . Hypertension   . Hyperlipidemia   . SOB (shortness of breath)   . Headache   . Gout   . GERD (gastroesophageal reflux disease)   . Colitis   . Arthritis   . Anxiety   . Chronic anticoagulation   . Stroke     TIA-3'08, none recent  . Melanoma of nose     "was only a precancer area"-no problems since    Past Surgical History  Procedure Date  . Cataracts     bilateral  . Fracture surgery     ORIF-Lt. hip -,removed hardware  . Eye surgeries     Multiple eye surgeries for torned retina-bilateral  . Cardioversion     Unsuccessful  . Blepharoplasty     bilateral  . Colon polyps     removed via colonoscopy  . Tonsillectomy   . Total hip arthroplasty 04/03/2012    Procedure: TOTAL HIP ARTHROPLASTY ANTERIOR APPROACH;  Surgeon: Loanne Drilling, MD;  Location: [facility];  Service: Orthopedics;  Laterality: Left;    No family history on file.  History   Substance Use Topics  . Smoking status: Former Smoker -- 0.5 packs/day for 25 years    Types: Cigarettes    Quit date: 03/13/1990  . Smokeless tobacco: Not on file  . Alcohol Use: 2.5 oz/week    5 drink(s) per week     Comment: wine    OB History    Grav Para Term Preterm Abortions TAB SAB Ect Mult Living                  Review of Systems  Constitutional: Negative for fever.  Respiratory: Positive for shortness of breath.   Cardiovascular: Negative for chest pain.  All other systems reviewed and are negative.    Allergies  Benzonatate; Shrimp; Alendronate sodium; Dabigatran etexilate mesylate; Levofloxacin; Sulfonamide derivatives; and Tramadol  Home Medications   Current Outpatient Rx  Name  Route  Sig  Dispense  Refill  . AMLODIPINE BESYLATE 2.5 MG PO TABS   Oral   Take 2.5 mg by mouth every evening.         . ATORVASTATIN CALCIUM 20 MG PO TABS   Oral   Take 1 tablet (20 mg total) by mouth at bedtime.  30 tablet   5   . DIGOXIN 0.125 MG PO TABS   Oral   Take 125 mcg by mouth every morning.          . FUROSEMIDE 20 MG PO TABS   Oral   Take 20 mg by mouth daily as needed. fluid         . POLYSACCHARIDE IRON COMPLEX 150 MG PO CAPS   Oral   Take 1 capsule (150 mg total) by mouth daily.   21 capsule   0   . METHOCARBAMOL 500 MG PO TABS   Oral   Take 500 mg by mouth every 6 (six) hours as needed. Muscle spasms         . NEBIVOLOL HCL 10 MG PO TABS   Oral   Take 10 mg by mouth every morning.         Marland Kitchen NYSTATIN-TRIAMCINOLONE 100000-0.1 UNIT/GM-% EX CREA   Topical   Apply 1 application topically daily as needed. On fungus         . OMEPRAZOLE 20 MG PO CPDR   Oral   Take 20 mg by mouth every morning.          . OXYCODONE HCL 5 MG PO TABS   Oral   Take 5-10 mg by mouth every 6 (six) hours as needed. pain         . SERTRALINE HCL 50 MG PO TABS   Oral   Take 50 mg by mouth every morning.          . WARFARIN SODIUM 5 MG PO  TABS   Oral   Take 5 mg by mouth every evening.           BP 145/50  Pulse 76  Temp 98.5 F (36.9 C) (Oral)  Resp 23  Ht 5\' 7"  (1.702 m)  Wt 155 lb (70.308 kg)  BMI 24.28 kg/m2  SpO2 94%  Physical Exam  Nursing note and vitals reviewed. Constitutional: She appears well-developed and well-nourished. No distress.  HENT:  Head: Normocephalic and atraumatic.  Right Ear: External ear normal.  Left Ear: External ear normal.  Eyes: Conjunctivae normal are normal. Right eye exhibits no discharge. Left eye exhibits no discharge. No scleral icterus.  Neck: Neck supple. No tracheal deviation present.  Cardiovascular: Normal rate, regular rhythm and intact distal pulses.   Pulmonary/Chest: Effort normal. No stridor. No respiratory distress. She has no wheezes. She has rales (few crackles at bases).  Abdominal: Soft. Bowel sounds are normal. She exhibits no distension. There is no tenderness. There is no rebound and no guarding.  Musculoskeletal: She exhibits no edema and no tenderness.  Neurological: She is alert. She has normal strength. No sensory deficit. Cranial nerve deficit:  no gross defecits noted. She exhibits normal muscle tone. She displays no seizure activity. Coordination normal.  Skin: Skin is warm and dry. No rash noted.  Psychiatric: She has a normal mood and affect.    ED Course  Procedures (including critical care time)  Labs Reviewed  PRO B NATRIURETIC PEPTIDE - Abnormal; Notable for the following:    Pro B Natriuretic peptide (BNP) 2490.0 (*)    All other components within normal limits  BASIC METABOLIC PANEL - Abnormal; Notable for the following:    Sodium 132 (*)    Chloride 94 (*)    Glucose, Bld 108 (*)    GFR calc non Af Amer 82 (*)    All other components within normal limits  CBC - Abnormal; Notable for the following:    RBC 3.34 (*)    Hemoglobin 9.7 (*)    HCT 30.1 (*)    Platelets 413 (*)    All other components within normal limits  BLOOD GAS,  ARTERIAL - Abnormal; Notable for the following:    pO2, Arterial 70.0 (*)    Bicarbonate 28.1 (*)    Acid-Base Excess 4.0 (*)    All other components within normal limits  D-DIMER, QUANTITATIVE - Abnormal; Notable for the following:    D-Dimer, Quant 1.79 (*)    All other components within normal limits  PROTIME-INR - Abnormal; Notable for the following:    Prothrombin Time 26.6 (*)    INR 2.60 (*)    All other components within normal limits  PROTIME-INR - Abnormal; Notable for the following:    Prothrombin Time 25.0 (*)    INR 2.39 (*)    All other components within normal limits  DIGOXIN LEVEL - Abnormal; Notable for the following:    Digoxin Level 0.4 (*)    All other components within normal limits  PROTIME-INR - Abnormal; Notable for the following:    Prothrombin Time 24.4 (*)    INR 2.32 (*)    All other components within normal limits  CBC - Abnormal; Notable for the following:    RBC 3.52 (*)    Hemoglobin 10.3 (*)    HCT 31.0 (*)    All other components within normal limits  BASIC METABOLIC PANEL - Abnormal; Notable for the following:    Sodium 129 (*)    Chloride 92 (*)    Glucose, Bld 100 (*)    GFR calc non Af Amer 86 (*)    All other components within normal limits  TSH  POCT I-STAT TROPONIN I   Dg Chest 2 View  04/19/2012  *RADIOLOGY REPORT*  Clinical Data: 79 year old female cough and shortness of breath. Hypertension.  CHEST - 2 VIEW  Comparison: 04/14/2011 and earlier.  Findings: New small bilateral pleural effusions with patchy bibasilar opacity which may reflect atelectasis.  Chronic cardiomegaly appears increased since last year. Other mediastinal contours are within normal limits.  Visualized tracheal air column is within normal limits.  No pneumothorax or pulmonary edema.  No other acute pulmonary opacity. No acute osseous abnormality identified.  IMPRESSION: New small pleural effusions with patchy bibasilar opacity, favor atelectasis.  Mild interval  increased cardiomegaly.  No acute pulmonary edema.   Original Report Authenticated By: Erskine Speed, M.D.       MDM  Pt with increasing BNP.  Dyspnea on exertion in the ED.  Oxygen saturation decreases with walking.  Plan on iv lasix, diuresis.  Admit for CHF exacerbation.  Will CT chest to rule out PE with her recent history of surgery.        Celene Kras, MD 04/21/12 2156

## 2012-04-19 NOTE — Progress Notes (Signed)
Discussed w/ T.P. Noting timing of hgb drop c/w blood loss and fluids at surgery, she should respond to diuresis and recovery of hgb. Agree w/ asking cards to review.

## 2012-04-19 NOTE — Telephone Encounter (Signed)
Returned call to [place], Ms. Lorton's daughter. She reports having hip surgery and being discharged from the hospital on 04/05/12. She states that since that time she has gained 15lbs, had increased abdominal distension and LE edema, and shortness of breath that has worsened today. She was evaluated by her Pulmonologist office today who recommended Lasix 20mg  daily x3 days, low salt diet, CXR/labs, and follow up with her cardiologist. Lab and CXR results as below. She denied chest pain. I discussed these results with her. Her and her daughter were concerned and plan to seek medical attention tonight.   Labs:   04/19/2012 11:22  Sodium 134 (L)  Potassium 4.3  Chloride 99  CO2 29  BUN 9  Creatinine 0.6  Calcium 8.6  Glucose 104 (H)  GFR 98.47     04/19/2012 11:22  Pro B Natriuretic peptide (BNP) 322.0 (H)     04/19/2012 11:22  WBC 4.6  RBC 3.37 (L)  Hemoglobin 9.9 (L)  HCT 30.5 (L)  MCV 90.5  MCHC 32.3  RDW 16.5 (H)  Platelets 501.0 (H)     04/19/2012 11:22  Prothrombin Time 30.9 (H)  INR 3.0 (H)   CXR: Findings: New small bilateral pleural effusions with patchy  bibasilar opacity which may reflect atelectasis. Chronic cardiomegaly appears increased since last year. Other mediastinal contours are within normal limits. Visualized tracheal air column is within normal limits. No pneumothorax or pulmonary edema. No other acute pulmonary opacity. No acute osseous abnormality identified.  IMPRESSION: New small pleural effusions with patchy bibasilar opacity, favor atelectasis. Mild interval increased cardiomegaly. No acute pulmonary edema.  Gilbert, PA-C 04/19/2012 7:32 PM

## 2012-04-19 NOTE — Patient Instructions (Addendum)
Lasix 20 mg daily x3 days. Low salt diet. I will call with x-ray and lab results. Follow Dr. Maple Hudson to 2 weeks and as needed. Please contact office for sooner follow up if symptoms do not improve or worsen or seek emergency care

## 2012-04-19 NOTE — Progress Notes (Signed)
Result Notes     Notes Recorded by Sherre Lain, MA on 04/19/2012 at 5:54 PM Called spoke with patient, advised of lab results / recs as stated by TP. Pt verbalized her understanding and denied any questions. Results forwarded to CC. Orders only encounter created for cards referral; will discuss w/ CY's nurse about work-in appt for next week. ------  Notes Recorded by Julio Sicks, NP on 04/19/2012 at 4:12 PM BNP min elevated c/w fluid overload  Cont w/ lasix  follow up next week with Dr. Maple Hudson  hbg is stable  INR is therapeutic -fax to coumadin clinic for dosing.  Kidney fxn nml

## 2012-04-20 ENCOUNTER — Encounter (HOSPITAL_COMMUNITY): Payer: Self-pay | Admitting: Internal Medicine

## 2012-04-20 ENCOUNTER — Inpatient Hospital Stay (HOSPITAL_COMMUNITY): Payer: Medicare Other

## 2012-04-20 DIAGNOSIS — I509 Heart failure, unspecified: Secondary | ICD-10-CM

## 2012-04-20 DIAGNOSIS — I97131 Postprocedural heart failure following other surgery: Secondary | ICD-10-CM | POA: Diagnosis present

## 2012-04-20 DIAGNOSIS — I4891 Unspecified atrial fibrillation: Secondary | ICD-10-CM

## 2012-04-20 DIAGNOSIS — I10 Essential (primary) hypertension: Secondary | ICD-10-CM

## 2012-04-20 LAB — PROTIME-INR
INR: 2.39 — ABNORMAL HIGH (ref 0.00–1.49)
Prothrombin Time: 26.6 seconds — ABNORMAL HIGH (ref 11.6–15.2)
Prothrombin Time: 25 seconds — ABNORMAL HIGH (ref 11.6–15.2)

## 2012-04-20 LAB — DIGOXIN LEVEL: Digoxin Level: 0.4 ng/mL — ABNORMAL LOW (ref 0.8–2.0)

## 2012-04-20 LAB — TSH: TSH: 1.486 u[IU]/mL (ref 0.350–4.500)

## 2012-04-20 MED ORDER — SODIUM CHLORIDE 0.9 % IJ SOLN
3.0000 mL | INTRAMUSCULAR | Status: DC | PRN
  Administered 2012-04-20: 3 mL via INTRAVENOUS

## 2012-04-20 MED ORDER — ONDANSETRON HCL 4 MG/2ML IJ SOLN: 4.0000 mg | Freq: Four times a day (QID) | INTRAMUSCULAR | Status: DC | PRN

## 2012-04-20 MED ORDER — OXYCODONE HCL 5 MG PO TABS: 5.0000 mg | ORAL_TABLET | Freq: Four times a day (QID) | ORAL | Status: DC | PRN

## 2012-04-20 MED ORDER — ATORVASTATIN CALCIUM 20 MG PO TABS
20.0000 mg | ORAL_TABLET | Freq: Every day | ORAL | Status: DC
  Administered 2012-04-20: 20 mg via ORAL
  Filled 2012-04-20 (×2): qty 1

## 2012-04-20 MED ORDER — NEBIVOLOL HCL 10 MG PO TABS
10.0000 mg | ORAL_TABLET | Freq: Every morning | ORAL | Status: DC
  Administered 2012-04-20 – 2012-04-21 (×2): 10 mg via ORAL
  Filled 2012-04-20 (×2): qty 1

## 2012-04-20 MED ORDER — DOCUSATE SODIUM 100 MG PO CAPS
100.0000 mg | ORAL_CAPSULE | Freq: Two times a day (BID) | ORAL | Status: DC
  Administered 2012-04-20 – 2012-04-21 (×3): 100 mg via ORAL
  Filled 2012-04-20 (×4): qty 1

## 2012-04-20 MED ORDER — SODIUM CHLORIDE 0.9 % IJ SOLN
3.0000 mL | Freq: Two times a day (BID) | INTRAMUSCULAR | Status: DC
  Administered 2012-04-20 – 2012-04-21 (×3): 3 mL via INTRAVENOUS

## 2012-04-20 MED ORDER — HYDROMORPHONE HCL PF 1 MG/ML IJ SOLN
1.0000 mg | INTRAMUSCULAR | Status: DC | PRN
  Filled 2012-04-20: qty 1

## 2012-04-20 MED ORDER — NYSTATIN-TRIAMCINOLONE 100000-0.1 UNIT/GM-% EX CREA
1.0000 "application " | TOPICAL_CREAM | Freq: Two times a day (BID) | CUTANEOUS | Status: DC
  Administered 2012-04-21: 1 via TOPICAL
  Filled 2012-04-20: qty 15

## 2012-04-20 MED ORDER — SODIUM CHLORIDE 0.9 % IV SOLN: 250.0000 mL | INTRAVENOUS | Status: DC | PRN

## 2012-04-20 MED ORDER — WARFARIN - PHARMACIST DOSING INPATIENT: Freq: Every day | Status: DC

## 2012-04-20 MED ORDER — ONDANSETRON HCL 4 MG PO TABS: 4.0000 mg | ORAL_TABLET | Freq: Four times a day (QID) | ORAL | Status: DC | PRN

## 2012-04-20 MED ORDER — PANTOPRAZOLE SODIUM 40 MG PO TBEC
40.0000 mg | DELAYED_RELEASE_TABLET | Freq: Every day | ORAL | Status: DC
  Administered 2012-04-20 – 2012-04-21 (×2): 40 mg via ORAL
  Filled 2012-04-20 (×2): qty 1

## 2012-04-20 MED ORDER — SERTRALINE HCL 50 MG PO TABS
50.0000 mg | ORAL_TABLET | Freq: Every morning | ORAL | Status: DC
  Administered 2012-04-20 – 2012-04-21 (×2): 50 mg via ORAL
  Filled 2012-04-20 (×2): qty 1

## 2012-04-20 MED ORDER — SODIUM CHLORIDE 0.9 % IJ SOLN: 3.0000 mL | Freq: Two times a day (BID) | INTRAMUSCULAR | Status: DC

## 2012-04-20 MED ORDER — FUROSEMIDE 10 MG/ML IJ SOLN
40.0000 mg | Freq: Every day | INTRAMUSCULAR | Status: DC
  Administered 2012-04-20 – 2012-04-21 (×2): 40 mg via INTRAVENOUS
  Filled 2012-04-20 (×2): qty 4

## 2012-04-20 MED ORDER — AMLODIPINE BESYLATE 2.5 MG PO TABS
2.5000 mg | ORAL_TABLET | Freq: Every evening | ORAL | Status: DC
  Administered 2012-04-20: 2.5 mg via ORAL
  Filled 2012-04-20 (×2): qty 1

## 2012-04-20 MED ORDER — WARFARIN SODIUM 5 MG PO TABS
5.0000 mg | ORAL_TABLET | Freq: Once | ORAL | Status: AC
  Administered 2012-04-20: 5 mg via ORAL
  Filled 2012-04-20: qty 1

## 2012-04-20 MED ORDER — POLYSACCHARIDE IRON COMPLEX 150 MG PO CAPS
150.0000 mg | ORAL_CAPSULE | Freq: Every day | ORAL | Status: DC
  Filled 2012-04-20 (×2): qty 1

## 2012-04-20 MED ORDER — DIGOXIN 125 MCG PO TABS
125.0000 ug | ORAL_TABLET | Freq: Every morning | ORAL | Status: DC
  Administered 2012-04-20 – 2012-04-21 (×2): 125 ug via ORAL
  Filled 2012-04-20 (×2): qty 1

## 2012-04-20 MED ORDER — IOHEXOL 350 MG/ML SOLN
100.0000 mL | Freq: Once | INTRAVENOUS | Status: AC | PRN
  Administered 2012-04-20: 100 mL via INTRAVENOUS

## 2012-04-20 NOTE — H&P (Signed)
[facility] History and Physical  Natalie Ho:[id number] DOB: 1930/10/30    PCP:   [name], MD   Chief Complaint: shortness of breath and 15 lbs weight gain.  HPI: Natalie Ho is an 79 y.o. female with hx of afib on Coumadin, AI, HTN, hyperlipidemia, anxiety, colitis, with Left hip surgery about 2 weeks ago, presents to her physician office complaining of SOB and orthopnea with 15 lbs weight gain since her surgery.  She was found to have elevated BNP at the MD's office to about 300's.  She was given PO lasix and subsequently referred to the ER.  Evaluation in the ER showed BNP of 2500's,  WBC of 4.6K, and Hb of 9.9g/DL, with cr of 0.6.  CXR showed bibisilar opacities likely atelectasis and no pulmonary edema.  Her INR is in the therapeutic range.  She was given IV lasix with good diuresis and felt significantly better.  Her D-Dimer is elevated to 1.79.  Hospitalist was asked to admit her for volume overload.  Rewiew of Systems:  Constitutional: Negative for malaise, fever and chills. No significant weight loss  Eyes: Negative for eye pain, redness and discharge, diplopia, visual changes, or flashes of light. ENMT: Negative for ear pain, hoarseness, nasal congestion, sinus pressure and sore throat. No headaches; tinnitus, drooling, or problem swallowing. Cardiovascular: Negative for chest pain, palpitations, diaphoresis,  ; No orthopnea, PND Respiratory: Negative for cough, hemoptysis, wheezing and stridor. No pleuritic chestpain. Gastrointestinal: Negative for nausea, vomiting, diarrhea, constipation, abdominal pain, melena, blood in stool, hematemesis, jaundice and rectal bleeding.    Genitourinary: Negative for frequency, dysuria, incontinence,flank pain and hematuria; Musculoskeletal: Negative for back pain and neck pain. Negative for swelling and trauma.;  Skin: . Negative for pruritus, rash, abrasions, bruising and skin lesion.; ulcerations Neuro: Negative for  headache, lightheadedness and neck stiffness. Negative for weakness, altered level of consciousness , altered mental status, extremity weakness, burning feet, involuntary movement, seizure and syncope.  Psych: negative for anxiety, depression, insomnia, tearfulness, panic attacks, hallucinations, paranoia, suicidal or homicidal ideation    Past Medical History  Diagnosis Date  . Atrial fibrillation   . Aortic insufficiency   . Hypertension   . Hyperlipidemia   . SOB (shortness of breath)   . Headache   . Gout   . GERD (gastroesophageal reflux disease)   . Colitis   . Arthritis   . Anxiety   . Chronic anticoagulation   . Stroke     TIA-3'08, none recent  . Melanoma of nose     "was only a precancer area"-no problems since    Past Surgical History  Procedure Laterality Date  . Cataracts      bilateral  . Fracture surgery      ORIF-Lt. hip -,removed hardware  . Eye surgeries      Multiple eye surgeries for torned retina-bilateral  . Cardioversion      Unsuccessful  . Blepharoplasty      bilateral  . Colon polyps      removed via colonoscopy  . Tonsillectomy    . Total hip arthroplasty  04/03/2012    Procedure: TOTAL HIP ARTHROPLASTY ANTERIOR APPROACH;  Surgeon: Loanne Drilling, MD;  Location: [facility];  Service: Orthopedics;  Laterality: Left;    Medications:  HOME MEDS: Prior to Admission medications   Medication Sig Start Date End Date Taking? Authorizing Provider  amLODipine (NORVASC) 2.5 MG tablet Take 2.5 mg by mouth every evening.   Yes Historical Provider, MD  atorvastatin (LIPITOR) 20 MG tablet Take 1 tablet (20 mg total) by mouth at bedtime. 04/10/12  Yes Vesta Mixer, MD  digoxin (LANOXIN) 0.125 MG tablet Take 125 mcg by mouth every morning.    Yes Historical Provider, MD  furosemide (LASIX) 20 MG tablet Take 20 mg by mouth daily as needed. fluid 04/19/12 04/19/13 Yes Tammy S Parrett, NP  iron polysaccharides (NIFEREX) 150 MG capsule Take 1 capsule (150 mg total)  by mouth daily. 04/05/12  Yes Alexzandrew Julien Girt, PA  methocarbamol (ROBAXIN) 500 MG tablet Take 500 mg by mouth every 6 (six) hours as needed. Muscle spasms 04/05/12  Yes Alexzandrew Julien Girt, PA  nebivolol (BYSTOLIC) 10 MG tablet Take 10 mg by mouth every morning.   Yes Historical Provider, MD  nystatin-triamcinolone (MYCOLOG II) cream Apply 1 application topically daily as needed. On fungus   Yes Historical Provider, MD  omeprazole (PRILOSEC) 20 MG capsule Take 20 mg by mouth every morning.    Yes Historical Provider, MD  oxyCODONE (OXY IR/ROXICODONE) 5 MG immediate release tablet Take 5-10 mg by mouth every 6 (six) hours as needed. pain 04/05/12  Yes Alexzandrew Julien Girt, PA  sertraline (ZOLOFT) 50 MG tablet Take 50 mg by mouth every morning.    Yes Historical Provider, MD  warfarin (COUMADIN) 5 MG tablet Take 5 mg by mouth every evening. 04/05/12  Yes Alexzandrew Julien Girt, PA     Allergies:  Allergies  Allergen Reactions  . Benzonatate Other (See Comments)    Halluccinations  . Shrimp (Shellfish Allergy) Anaphylaxis and Swelling  . Alendronate Sodium Other (See Comments)    Unknown reaction  . Dabigatran Etexilate Mesylate Other (See Comments)    Upset stomach"Pradaxa"  . Levofloxacin Other (See Comments)    "made her funny in the head"  . Sulfonamide Derivatives Other (See Comments)    Unknown reaction  . Tramadol Other (See Comments)    Unknown reaction    Social History:   reports that she quit smoking about 22 years ago. Her smoking use included Cigarettes. She has a 12.5 pack-year smoking history. She does not have any smokeless tobacco history on file. She reports that she drinks about 2.5 ounces of alcohol per week. She reports that she does not use illicit drugs.  Family History: No family history on file.   Physical Exam: Filed Vitals:   04/19/12 2115 04/19/12 2130 04/19/12 2200 04/19/12 2245  BP:  124/65 144/62   Pulse: 76 66  69  Temp:      TempSrc:      Resp: 23  18 16 22   Height:      Weight:      SpO2: 94% 95%  100%   Blood pressure 144/62, pulse 69, temperature 98.5 F (36.9 C), temperature source Oral, resp. rate 22, height 5\' 7"  (1.702 m), weight 70.308 kg (155 lb), SpO2 100.00%.  GEN:  Pleasant  patient lying in the stretcher in no acute distress; cooperative with exam. PSYCH:  alert and oriented x4; does not appear anxious or depressed; affect is appropriate. HEENT: Mucous membranes pink and anicteric; PERRLA; EOM intact; no cervical lymphadenopathy nor thyromegaly or carotid bruit; no JVD; There were no stridor. Neck is very supple. Breasts:: Not examined CHEST WALL: No tenderness CHEST: Normal respiration, clear to auscultation bilaterally.  HEART: Regular rate and rhythm.  There are no murmur, rub, or gallops.   BACK: No kyphosis or scoliosis; no CVA tenderness ABDOMEN: soft and non-tender; no masses, no organomegaly, normal abdominal bowel sounds; no  pannus; no intertriginous candida. There is no rebound and no distention. Rectal Exam: Not done EXTREMITIES: No bone or joint deformity; age-appropriate arthropathy of the hands and knees; 1+ edema; no ulcerations.  There is no calf tenderness. Genitalia: not examined PULSES: 2+ and symmetric SKIN: Normal hydration no rash or ulceration, left hip surgery with well wound healing. CNS: Cranial nerves 2-12 grossly intact no focal lateralizing neurologic deficit.  Speech is fluent; uvula elevated with phonation, facial symmetry and tongue midline. DTR are normal bilaterally, cerebella exam is intact, barbinski is negative and strengths are equaled bilaterally.  No sensory loss.   Labs on Admission:  Basic Metabolic Panel:  Recent Labs Lab 04/19/12 1122 04/19/12 2235  NA 134* 132*  K 4.3 4.1  CL 99 94*  CO2 29 29  GLUCOSE 104* 108*  BUN 9 9  CREATININE 0.6 0.65  CALCIUM 8.6 8.8   Liver Function Tests: No results found for this basename: AST, ALT, ALKPHOS, BILITOT, PROT, ALBUMIN,  in  the last 168 hours No results found for this basename: LIPASE, AMYLASE,  in the last 168 hours No results found for this basename: AMMONIA,  in the last 168 hours CBC:  Recent Labs Lab 04/19/12 1122 04/19/12 2235  WBC 4.6 5.2  NEUTROABS 3.4  --   HGB 9.9* 9.7*  HCT 30.5* 30.1*  MCV 90.5 90.1  PLT 501.0* 413*   Cardiac Enzymes: No results found for this basename: CKTOTAL, CKMB, CKMBINDEX, TROPONINI,  in the last 168 hours  CBG: No results found for this basename: GLUCAP,  in the last 168 hours   Radiological Exams on Admission: Dg Chest 2 View  04/19/2012  *RADIOLOGY REPORT*  Clinical Data: 79 year old female cough and shortness of breath. Hypertension.  CHEST - 2 VIEW  Comparison: 04/14/2011 and earlier.  Findings: New small bilateral pleural effusions with patchy bibasilar opacity which may reflect atelectasis.  Chronic cardiomegaly appears increased since last year. Other mediastinal contours are within normal limits.  Visualized tracheal air column is within normal limits.  No pneumothorax or pulmonary edema.  No other acute pulmonary opacity. No acute osseous abnormality identified.  IMPRESSION: New small pleural effusions with patchy bibasilar opacity, favor atelectasis.  Mild interval increased cardiomegaly.  No acute pulmonary edema.   Original Report Authenticated By: Erskine Speed, M.D.      Assessment/Plan Present on Admission:  . Dyspnea on exertion . CHF following non-cardiac surgery, postop . HYPERTENSION . HYPERLIPIDEMIA . G E R D . Atrial fibrillation   PLAN:  Will admit her for CHF following noncardiac surgery.  She is in therapeutic anticoagulation, so her risk of PE is lower, but possible, so will get CTPA.  I will continue her coumadin for her afib.  She is on dig so will get a level.  For her HTN, will continue her meds.  Will diurese her carefully, and obtain a cardiac echo. She needs an EKG.  She is very stable, full code, and will be admitted to [facility] service.     Thank you for allowing me to participate in the care of your nice patient.    Other plans as per orders.  Code Status: FULL Unk Lightning, MD. [facility] Pager [phone] 7pm to 7am.  04/20/2012, 6:32 AM

## 2012-04-20 NOTE — Progress Notes (Signed)
ANTICOAGULATION CONSULT NOTE - Initial Consult  Pharmacy Consult for warfarin Indication: atrial fibrillation  Allergies  Allergen Reactions  . Benzonatate Other (See Comments)    Halluccinations  . Shrimp (Shellfish Allergy) Anaphylaxis and Swelling  . Alendronate Sodium Other (See Comments)    Unknown reaction  . Dabigatran Etexilate Mesylate Other (See Comments)    Upset stomach"Pradaxa"  . Levofloxacin Other (See Comments)    "made her funny in the head"  . Sulfonamide Derivatives Other (See Comments)    Unknown reaction  . Tramadol Other (See Comments)    Unknown reaction    Patient Measurements: Height: 5\' 7"  (170.2 cm) Weight: 155 lb (70.308 kg) IBW/kg (Calculated) : 61.6 Heparin Dosing Weight:   Vital Signs: Temp: 98.5 F (36.9 C) (02/07 2046) Temp src: Oral (02/07 2046) BP: 144/62 mmHg (02/07 2200) Pulse Rate: 69 (02/07 2245)  Labs:  Recent Labs  04/19/12 1122 04/19/12 2235 04/19/12 2315  HGB 9.9* 9.7*  --   HCT 30.5* 30.1*  --   PLT 501.0* 413*  --   LABPROT 30.9*  --  26.6*  INR 3.0*  --  2.60*  CREATININE 0.6 0.65  --     Estimated Creatinine Clearance: 55.5 ml/min (by C-G formula based on Cr of 0.65).   Medical History: Past Medical History  Diagnosis Date  . Atrial fibrillation   . Aortic insufficiency   . Hypertension   . Hyperlipidemia   . SOB (shortness of breath)   . Headache   . Gout   . GERD (gastroesophageal reflux disease)   . Colitis   . Arthritis   . Anxiety   . Chronic anticoagulation   . Stroke     TIA-3'08, none recent  . Melanoma of nose     "was only a precancer area"-no problems since    Medications:  Prescriptions prior to admission  Medication Sig Dispense Refill  . amLODipine (NORVASC) 2.5 MG tablet Take 2.5 mg by mouth every evening.      Marland Kitchen atorvastatin (LIPITOR) 20 MG tablet Take 1 tablet (20 mg total) by mouth at bedtime.  30 tablet  5  . digoxin (LANOXIN) 0.125 MG tablet Take 125 mcg by mouth every  morning.       . furosemide (LASIX) 20 MG tablet Take 20 mg by mouth daily as needed. fluid      . iron polysaccharides (NIFEREX) 150 MG capsule Take 1 capsule (150 mg total) by mouth daily.  21 capsule  0  . methocarbamol (ROBAXIN) 500 MG tablet Take 500 mg by mouth every 6 (six) hours as needed. Muscle spasms      . nebivolol (BYSTOLIC) 10 MG tablet Take 10 mg by mouth every morning.      . nystatin-triamcinolone (MYCOLOG II) cream Apply 1 application topically daily as needed. On fungus      . omeprazole (PRILOSEC) 20 MG capsule Take 20 mg by mouth every morning.       Marland Kitchen oxyCODONE (OXY IR/ROXICODONE) 5 MG immediate release tablet Take 5-10 mg by mouth every 6 (six) hours as needed. pain      . sertraline (ZOLOFT) 50 MG tablet Take 50 mg by mouth every morning.       . warfarin (COUMADIN) 5 MG tablet Take 5 mg by mouth every evening.       Scheduled:  . amLODipine  2.5 mg Oral QPM  . atorvastatin  20 mg Oral QHS  . digoxin  125 mcg Oral q morning - 10a  .  docusate sodium  100 mg Oral BID  . furosemide  40 mg Intravenous Once  . furosemide  40 mg Intravenous Daily  . iron polysaccharides  150 mg Oral Daily  . nebivolol  10 mg Oral q morning - 10a  . nystatin-triamcinolone  1 application Topical BID  . pantoprazole  40 mg Oral Daily  . sertraline  50 mg Oral q morning - 10a  . sodium chloride  3 mL Intravenous Q12H  . warfarin  5 mg Oral ONCE-1800  . Warfarin - Pharmacist Dosing Inpatient   Does not apply q1800  . [DISCONTINUED] sodium chloride  3 mL Intravenous Q12H    Assessment: Patient on chronic warfarin for afib.  INR repeat 2/7 was at goal.  Patient took half dose 2.5mg  2/7 per med rec.  Goal of Therapy:  INR 2-3    Plan:  Daily INR Warfarin 5mg  po x1 at [address] [place] 04/20/2012,6:19 AM

## 2012-04-20 NOTE — Progress Notes (Signed)
CSW unsure if Pt meets criteria for SNF placement at this time, however nursing staff is ordering a PT eval to assess Pt's mobility/limitations. Pt is status post Hip surgery on 04/03/12 and stated that her husband stated he could take care of her and she does not feel he is able to at this time.   CSW awaiting consult and PT recommendations.   CSW to follow for d/c planning.    Leron Croak, LCSWA [employer] [phone]

## 2012-04-20 NOTE — Progress Notes (Signed)
Pt seen and examined at bedside. Vital signs and blood work reviewed. Pt is hemodynamically stable. Admitting diagnosis of CHF made but given elevated d-dimer, CT angio chest ordered and was negative for PE. Plan on ordering PT evaluation to facilitate discharge planning. CBC and BMP in AM.  Debbora Presto, MD  [facility] Pager [phone]  If 7PM-7AM, please contact night-coverage [email or web address] Password [email or web address]

## 2012-04-20 NOTE — Progress Notes (Signed)
*  PRELIMINARY RESULTS* Echocardiogram 2D Echocardiogram has been performed.  Natalie Ho 04/20/2012, 11:31 AM

## 2012-04-21 DIAGNOSIS — R6889 Other general symptoms and signs: Secondary | ICD-10-CM

## 2012-04-21 DIAGNOSIS — J45909 Unspecified asthma, uncomplicated: Secondary | ICD-10-CM

## 2012-04-21 LAB — BASIC METABOLIC PANEL
Calcium: 8.7 mg/dL (ref 8.4–10.5)
GFR calc Af Amer: >90 mL/min (ref >90)
GFR calc non Af Amer: 86 mL/min — ABNORMAL LOW (ref >90)
Potassium: 3.5 mEq/L (ref 3.5–5.1)
Sodium: 129 mEq/L — ABNORMAL LOW (ref 135–145)

## 2012-04-21 LAB — CBC
MCH: 29.3 pg (ref 26.0–34.0)
MCHC: 33.2 g/dL (ref 30.0–36.0)
RDW: 15 % (ref 11.5–15.5)

## 2012-04-21 LAB — PROTIME-INR
INR: 2.32 — ABNORMAL HIGH (ref 0.00–1.49)
Prothrombin Time: 24.4 seconds — ABNORMAL HIGH (ref 11.6–15.2)

## 2012-04-21 MED ORDER — WARFARIN SODIUM 5 MG PO TABS
5.0000 mg | ORAL_TABLET | Freq: Once | ORAL | Status: DC
  Filled 2012-04-21: qty 1

## 2012-04-21 NOTE — Discharge Summary (Signed)
Physician Discharge Summary  Natalie Ho ZOX:[id number] DOB: 08/29/1930 DOA: 04/19/2012  PCP: [name], MD  Admit date: 04/19/2012 Discharge date: 04/21/2012  Recommendations for Outpatient Follow-up:  1. Pt will need to follow up with PCP in 2-3 weeks post discharge 2. Please obtain BMP to evaluate electrolytes and kidney function, sodium level 3. Please also check CBC to evaluate Hg and Hct levels 4. Please note that weight on discharge was 143 lbs  Discharge Diagnoses: Diastolic CHF, acute exacerbation  Principal Problem:   CHF following non-cardiac surgery, postop Active Problems:   HYPERLIPIDEMIA   HYPERTENSION   Atrial fibrillation   G E R D   Dyspnea on exertion  Discharge Condition: Stable  Diet recommendation: Heart healthy diet discussed in details   History of present illness:  Pt is 79 y.o. female with hx of afib on Coumadin, AI, HTN, hyperlipidemia, anxiety, colitis, with Left hip surgery about 2 weeks ago, presents to her physician office complaining of SOB and orthopnea with 15 lbs weight gain since her surgery. She was found to have elevated BNP at the MD's office to about 300's. She was given PO lasix and subsequently referred to the ER. Evaluation in the ER showed BNP of 2500's, WBC of 4.6K, and Hb of 9.9g/DL, with cr of 0.6. CXR showed bibisilar opacities likely atelectasis and no pulmonary edema. Her INR was in the therapeutic range. She was given IV lasix with good diuresis and felt significantly better. Her D-Dimer was elevated to 1.79. Hospitalist was asked to admit her for volume overload.  Hospital Course:  Principal Problem:   CHF following non-cardiac surgery, postop - secondary to acute diastolic failure - Lasix IV given and pt has responded well, good UOP, kidney function stable - pt was advised to continue home dose Lasix and to monitor weights daily and to present it to PCP Active Problems:   HYPERLIPIDEMIA - continue statin     HYPERTENSION  - reasonable inpatient control    Atrial fibrillation - rate controlled inpatient  - continue Coumadin   Procedures/Studies: Dg Chest 2 View 04/19/2012     New small pleural effusions with patchy bibasilar opacity, favor atelectasis.  Mild interval increased cardiomegaly.  No acute pulmonary edema.  Ct Angio Chest Pe W/cm &/or Wo Cm 04/20/2012    No evidence of acute pulmonary embolism.  Small pleural effusions, cardiomegaly, and slightly prominent interstitial markings may indicate very mild interstitial edema.  Calcified mediastinal and hilar nodes with calcified right upper lobe granuloma consistent with prior granulomatous disease.    Dg Hip Portable 1 View Left 04/03/2012   Status post anterior approach left T H R.     Dg C-arm 1-60 Min-no Report 04/03/2012   Status post anterior approach left T H R.     Consultations:  None  Antibiotics:  None  Discharge Exam: Filed Vitals:   04/21/12 0542  BP: 145/87  Pulse:   Temp: 97.7 F (36.5 C)  Resp: 20   Filed Vitals:   04/20/12 0555 04/20/12 1300 04/20/12 2100 04/21/12 0542  BP: 155/72 125/74 119/69 145/87  Pulse: 52 74 65   Temp: 98.1 F (36.7 C) 97.8 F (36.6 C) 98.1 F (36.7 C) 97.7 F (36.5 C)  TempSrc: Oral Oral Oral Oral  Resp: 20 19 20 20   Height:      Weight:    64.9 kg (143 lb 1.3 oz)  SpO2: 95% 98% 98% 96%    General: Pt is alert, follows commands appropriately, not  in acute distress Cardiovascular: Regular rate and rhythm, S1/S2 +, no murmurs, no rubs, no gallops Respiratory: Clear to auscultation bilaterally, no wheezing, no crackles, no rhonchi Abdominal: Soft, non tender, non distended, bowel sounds +, no guarding Extremities: no edema, no cyanosis, pulses palpable bilaterally DP and PT Neuro: Grossly nonfocal  Discharge Instructions  Discharge Orders   Future Appointments Provider Department Dept Phone   05/07/2012 11:00 AM Waymon Budge, MD [facility] [phone]    01/27/2013 10:15 AM Waymon Budge, MD [facility] [phone]   Future Orders Complete By Expires     Diet - low sodium heart healthy  As directed     Increase activity slowly  As directed         Medication List    TAKE these medications       amLODipine 2.5 MG tablet  Commonly known as:  NORVASC  Take 2.5 mg by mouth every evening.     atorvastatin 20 MG tablet  Commonly known as:  LIPITOR  Take 1 tablet (20 mg total) by mouth at bedtime.     digoxin 0.125 MG tablet  Commonly known as:  LANOXIN  Take 125 mcg by mouth every morning.     furosemide 20 MG tablet  Commonly known as:  LASIX  Take 20 mg by mouth daily as needed. fluid     iron polysaccharides 150 MG capsule  Commonly known as:  NIFEREX  Take 1 capsule (150 mg total) by mouth daily.     methocarbamol 500 MG tablet  Commonly known as:  ROBAXIN  Take 500 mg by mouth every 6 (six) hours as needed. Muscle spasms     nebivolol 10 MG tablet  Commonly known as:  BYSTOLIC  Take 10 mg by mouth every morning.     nystatin-triamcinolone cream  Commonly known as:  MYCOLOG II  Apply 1 application topically daily as needed. On fungus     omeprazole 20 MG capsule  Commonly known as:  PRILOSEC  Take 20 mg by mouth every morning.     oxyCODONE 5 MG immediate release tablet  Commonly known as:  Oxy IR/ROXICODONE  Take 5-10 mg by mouth every 6 (six) hours as needed. pain     sertraline 50 MG tablet  Commonly known as:  ZOLOFT  Take 50 mg by mouth every morning.     warfarin 5 MG tablet  Commonly known as:  COUMADIN  Take 5 mg by mouth every evening.           Follow-up Information   Follow up with BADGER,MICHAEL C, MD In 1 week.   Contact information:   [address] [place] [address] [phone]        The results of significant diagnostics from this hospitalization (including imaging, microbiology, ancillary and laboratory) are listed below for reference.      Microbiology: No results found for this or any previous visit (from the past 240 hour(s)).   Labs: Basic Metabolic Panel:  Recent Labs Lab 04/19/12 1122 04/19/12 2235 04/21/12 0445  NA 134* 132* 129*  K 4.3 4.1 3.5  CL 99 94* 92*  CO2 29 29 31   GLUCOSE 104* 108* 100*  BUN 9 9 7   CREATININE 0.6 0.65 0.57  CALCIUM 8.6 8.8 8.7   CBC:  Recent Labs Lab 04/19/12 1122 04/19/12 2235 04/21/12 0445  WBC 4.6 5.2 4.0  NEUTROABS 3.4  --   --   HGB 9.9* 9.7* 10.3*  HCT 30.5* 30.1* 31.0*  MCV 90.5 90.1 88.1  PLT 501.0* 413* 366  BNP: BNP (last 3 results)  Recent Labs  04/19/12 1122 04/19/12 2235  PROBNP 322.0* 2490.0*   SIGNED: Time coordinating discharge: Over 30 minutes  Debbora Presto, MD  [facility] 04/21/2012, 9:08 AM Pager [phone]  If 7PM-7AM, please contact night-coverage [email or web address] Password [email or web address]

## 2012-04-21 NOTE — Progress Notes (Signed)
ANTICOAGULATION CONSULT NOTE - Follow Up Consult  Pharmacy Consult for Warfarin Indication: atrial fibrillation  Allergies  Allergen Reactions  . Benzonatate Other (See Comments)    Halluccinations  . Shrimp (Shellfish Allergy) Anaphylaxis and Swelling  . Alendronate Sodium Other (See Comments)    Unknown reaction  . Dabigatran Etexilate Mesylate Other (See Comments)    Upset stomach"Pradaxa"  . Levofloxacin Other (See Comments)    "made her funny in the head"  . Sulfonamide Derivatives Other (See Comments)    Unknown reaction  . Tramadol Other (See Comments)    Unknown reaction    Patient Measurements: Height: 5\' 7"  (170.2 cm) Weight: 143 lb 1.3 oz (64.9 kg) IBW/kg (Calculated) : 61.6  Vital Signs: Temp: 97.7 F (36.5 C) (02/09 0542) Temp src: Oral (02/09 0542) BP: 145/87 mmHg (02/09 0542)  Labs:  Recent Labs  04/19/12 1122 04/19/12 2235 04/19/12 2315 04/20/12 0650 04/21/12 0445  HGB 9.9* 9.7*  --   --  10.3*  HCT 30.5* 30.1*  --   --  31.0*  PLT 501.0* 413*  --   --  366  LABPROT 30.9*  --  26.6* 25.0* 24.4*  INR 3.0*  --  2.60* 2.39* 2.32*  CREATININE 0.6 0.65  --   --  0.57    Estimated Creatinine Clearance: 55.5 ml/min (by C-G formula based on Cr of 0.57).   Medications:  Scheduled:  . amLODipine  2.5 mg Oral QPM  . atorvastatin  20 mg Oral QHS  . digoxin  125 mcg Oral q morning - 10a  . docusate sodium  100 mg Oral BID  . [COMPLETED] furosemide  40 mg Intravenous Once  . furosemide  40 mg Intravenous Daily  . iron polysaccharides  150 mg Oral Daily  . nebivolol  10 mg Oral q morning - 10a  . nystatin-triamcinolone  1 application Topical BID  . pantoprazole  40 mg Oral Daily  . sertraline  50 mg Oral q morning - 10a  . sodium chloride  3 mL Intravenous Q12H  . [COMPLETED] warfarin  5 mg Oral ONCE-1800  . Warfarin - Pharmacist Dosing Inpatient   Does not apply q1800    Assessment:  79 yo female admitted with CHF on 2/7. Ddimer elevated but  CT negative for PE.  Chronic warfarin for afib;   Most recent LB note indicates dose is 5mg  daily except 2.5mg  Wed/Sat. However, med rec states 5mg  daily, but pt took 2.5mg  on 2/7  INR therapeutic today (2.32)  CBC stable, no bleeding/complications reported.  Goal of Therapy:  INR 2-3   Plan:   Repeat warfarin 5mg  today  Daily PT/INR  Loralee Pacas, PharmD, BCPS Pager: [phone] 04/21/2012,9:05 AM

## 2012-04-21 NOTE — Progress Notes (Signed)
Patient has an uneventful night. She is quite ambulatory, and got up at least twice during the night on her own to use the bathroom. The bed alarm alerted [place] that she had left her bed. Standby assistance used to allow her bathroom use.

## 2012-04-22 ENCOUNTER — Telehealth: Payer: Self-pay | Admitting: Internal Medicine

## 2012-04-22 NOTE — Progress Notes (Signed)
08/24/10- Dr Duaine Dredge of Dr [facility] Sakai for chronic cough and PNDS. Has also been evaluated for a RUL nodule in past which is now deemed to be benign. Seen as add-on 6/13 with persistent to worsening cough, particularly in the past couple of weeks. Had 2 episodes of scant hemoptysis on 6/12. No increase in SOB. Denies CP or pleurodynia. Denies F/C/S, LE edema or calf tenderness. On warfarin for CAF. PT last checked approx 3 wks ago and has been in therapeutic range consistently. Denies overt epistaxis but thinks blood might have come from nose.  04/14/11- 30 yoF former smoker  Newly referred to [facility] by  [facility] with complaint of trouble breathing when exercising. Husband is here. Over the last year she's been more aware of dyspnea with exertion. She is comfortable at rest. The sensation has been constant without any original event recognized, variation from day-to-day or progression. This is noticed mainly if she is walking up an incline, taking stairs quickly without stopping or otherwise exercising a little harder than usual ADLs. She was seen here in June as noted above will followup of her chronic cough problems which had been present for years. Cough has been mostly dry with occasional wheeze. Heavy dust exposure may be a trigger. Atrial fibrillation diagnosed about 2007, no pacemaker/Dr. Acie Fredrickson. Denies anemia.  Denies history of pneumonia or TB exposure. PPD negative in the past. She worked in the past/[place] with possibility of histoplasma exposure but no rebound liver is. No diagnosis of sarcoid. Soc- Married housewife, quit cigs 1992. CXR- 04/14/11- images reviewed: IMPRESSION:  No acute cardiopulmonary process. Central vascular congestion  without overt edema.  Evidence of prior granulomatous disease.  Original Report Authenticated By: Arline Asp, M.D.   05/12/11- 20 yoF former smoker followed for dyspnea on exertion, complicated by hx lung nodule, GERD, AFib,  HBP, TIA. Little cough or phlegm. Still gets short of breath with exertion. Tolerated eye surgery. PFT: 04/20/2011-mild obstructive airways disease with insignificant response to bronchodilator, air trapping, diffusion mildly reduced. FEV1/FVC 0.63, DLCO 76%. 6 minute walk test-97%, 96%, 98%, 513 m. Well-maintained oxygenation.  06/26/11- 54 yoF former smoker followed for dyspnea on exertion, complicated by hx lung nodule, GERD, AFib, HBP, TIA. Acute visit-cough-productive at times-yellow; coughed up blood on Friday-not a lot. Husband here. Tudorza sample made her cough worse.  We called in an antibiotic which is helping acute bronchitis. Producing less phlegm. Codeine cough syrup is some help.  08/10/11- 89 yoF former smoker followed for dyspnea on exertion, complicated by hx lung nodule, GERD, AFib, HBP, TIA.    Dr Melford Aase PCP    Husband here Reports doing much better. Used up sample [facility]- says no longer needed. Benzonatate caused hallucination. We discussed CXR as reviewed w/ her- suggested vascular congestion, so improvement may have started with that. She no longer feels palpitation and denies orthopnea or swelling.   CXR 04/28/11 IMPRESSION:  No acute cardiopulmonary process. Central vascular congestion  without overt edema.  Evidence of prior granulomatous disease.  Original Report Authenticated By: Arline Asp, M.D.    02/05/12- 5 yoF former smoker followed for dyspnea on exertion, asthma/ bronchitis, complicated by hx lung nodule, GERD, AFib, HBP, TIA.    Dr Melford Aase PCP    Husband here Follows For:SOB about the same - Occas non prod cough - Occas wheezing - Denies chest tightness Cough most days is nonproductive. Some dyspnea on exertion, not worse. Plans hip replacement in January and the arthritis limits  her activity. Denies chest pain, palpitation, purulent or bloody sputum.  04/19/12 Acute OV  Complains of pt c/o increased SOB with activity and wheezing. Pt states these  symptoms are worse in the mornings and bedtime. . Symptoms have been worse x 10 days.  Pt also c/o increased swelling in feet and ankles.  Complains of DOE. Bilateral leg swelling . Weight is up 15lbs.  Denies chest pain , orthopnea, fever , discolored mucus or fever. No calf pain   Had elective left hip replacement 04/03/12 . Has had good recovery w/ rehab at home.  Has hx of Atrial Fib on chronic coumadin .  INR therapeutic last week at 2.2 .  CXR shows bilateral small pleural effusions.     ROS :  Constitutional:   No  weight loss, night sweats,  Fevers, chills,  +fatigue, or  lassitude.  HEENT:   No headaches,  Difficulty swallowing,  Tooth/dental problems, or  Sore throat,                No sneezing, itching, ear ache, nasal congestion, post nasal drip,   CV:  No chest pain,  Orthopnea, PND,   anasarca, dizziness, palpitations, syncope.   GI  No heartburn, indigestion, abdominal pain, nausea, vomiting, diarrhea, change in bowel habits, loss of appetite, bloody stools.   Resp:    No excess mucus, no productive cough,  No non-productive cough,  No coughing up of blood.  No change in color of mucus.  No wheezing.  No chest wall deformity  Skin: no rash or lesions.  GU: no dysuria, change in color of urine, no urgency or frequency.  No flank pain, no hematuria   MS:    No back pain.  Psych:  No change in mood or affect. No depression or anxiety.  No memory loss.       OBJ- Physical Exam GEN: A/Ox3; pleasant , NAD  HEENT:  [place]/AT,  EACs-clear, TMs-wnl, NOSE-clear, THROAT-clear, no lesions, no postnasal drip or exudate noted.   NECK:  Supple w/ fair ROM; no JVD; normal carotid impulses w/o bruits; no thyromegaly or nodules palpated; no lymphadenopathy.  RESP  Diminished BS in bases, no accessory muscle use, no dullness to percussion  CARD:  RRR, no m/r/g  , bilateral 1+  peripheral edema, pulses intact, no cyanosis or clubbing.  GI:   Soft & nt; nml bowel sounds; no  organomegaly or masses detected.  Musco: Warm bil, no deformities or joint swelling noted.   Neuro: alert, no focal deficits noted.    Skin: Warm, no lesions or rashes

## 2012-04-22 NOTE — Assessment & Plan Note (Addendum)
Flare of DOE - ? Fluid overload w/ wt gain >15lbs , small bilateral effusion on cxr  Hx of Atrial Fib - rate controlled. INR therapeutic.  Asthma appears compensated w/ good oxygenation and no bronchospasm on exam.  Will check labs w/ bnp /bmet  Case discussed in detail with Dr. Maple Hudson   Pt aware if condition worsens to seek ER attention.   Plan  Begin lasix 20mg  daily x 3 days  Labs w/ bnp.  Refer to cards for further evaluation -established pt  Please contact office for sooner follow up if symptoms do not improve or worsen or seek emergency care  follow up Dr. Maple Hudson  In 1 week and As needed

## 2012-04-23 NOTE — Telephone Encounter (Signed)
Pt wanted me to ask Tammy P if she still needed the ov with cardiology since she was in the hospital over the weekend and the fluid has been removed. Consulted Tammy P and she stated that pt still needed to keep appointment for Friday 04/26/12 at 1:30 with Lawson Fiscal at Cardiology. Pt is aware of Tammy P, recommendations. Rhonda J Cobb

## 2012-04-24 NOTE — Progress Notes (Signed)
Quick Note:  Called spoke with patient to schedule ov w/ CY next week (due to inclement weather). Pt already has ov w/ Cy 2.25.14 and does not want to be seen sooner; will keep the 2.25.14 appt. ______

## 2012-04-26 ENCOUNTER — Ambulatory Visit: Payer: Medicare Other | Admitting: Nurse Practitioner

## 2012-04-29 ENCOUNTER — Ambulatory Visit: Payer: Self-pay | Admitting: Cardiology

## 2012-04-29 DIAGNOSIS — I4891 Unspecified atrial fibrillation: Secondary | ICD-10-CM

## 2012-05-03 ENCOUNTER — Encounter: Payer: Self-pay | Admitting: Cardiovascular Disease

## 2012-05-07 ENCOUNTER — Ambulatory Visit: Payer: Medicare Other | Admitting: Internal Medicine

## 2012-05-08 ENCOUNTER — Ambulatory Visit (INDEPENDENT_AMBULATORY_CARE_PROVIDER_SITE_OTHER): Payer: Medicare Other | Admitting: Nurse Practitioner

## 2012-05-08 ENCOUNTER — Encounter: Payer: Self-pay | Admitting: Nurse Practitioner

## 2012-05-08 VITALS — BP 131/80 | HR 73 | Ht 67.0 in | Wt 142.0 lb

## 2012-05-08 DIAGNOSIS — R0600 Dyspnea, unspecified: Secondary | ICD-10-CM

## 2012-05-08 DIAGNOSIS — R0609 Other forms of dyspnea: Secondary | ICD-10-CM

## 2012-05-08 DIAGNOSIS — I5032 Chronic diastolic (congestive) heart failure: Secondary | ICD-10-CM

## 2012-05-08 LAB — CBC WITH DIFFERENTIAL/PLATELET
Basophils Absolute: 0.1 10*3/uL (ref 0.0–0.1)
Basophils Relative: 2.3 % (ref 0.0–3.0)
Eosinophils Absolute: 0.2 10*3/uL (ref 0.0–0.7)
Eosinophils Relative: 3.9 % (ref 0.0–5.0)
HCT: 35.3 % — ABNORMAL LOW (ref 36.0–46.0)
Hemoglobin: 11.6 g/dL — ABNORMAL LOW (ref 12.0–15.0)
Lymphocytes Relative: 19.6 % (ref 12.0–46.0)
Lymphs Abs: 0.9 10*3/uL (ref 0.7–4.0)
MCHC: 32.8 g/dL (ref 30.0–36.0)
MCV: 86.8 fl (ref 78.0–100.0)
Monocytes Absolute: 0.5 10*3/uL (ref 0.1–1.0)
Monocytes Relative: 11.3 % (ref 3.0–12.0)
Neutro Abs: 2.9 10*3/uL (ref 1.4–7.7)
Neutrophils Relative %: 62.9 % (ref 43.0–77.0)
Platelets: 295 10*3/uL (ref 150.0–400.0)
RBC: 4.06 Mil/uL (ref 3.87–5.11)
RDW: 15.1 % — ABNORMAL HIGH (ref 11.5–14.6)
WBC: 4.6 10*3/uL (ref 4.5–10.5)

## 2012-05-08 LAB — BASIC METABOLIC PANEL
BUN: 14 mg/dL (ref 6–23)
CO2: 29 mEq/L (ref 19–32)
Calcium: 8.8 mg/dL (ref 8.4–10.5)
Chloride: 98 mEq/L (ref 96–112)
Creatinine, Ser: 0.6 mg/dL (ref 0.4–1.2)
GFR: 94.92 mL/min (ref >60.00)
Glucose, Bld: 91 mg/dL (ref 70–99)
Potassium: 4.4 mEq/L (ref 3.5–5.1)
Sodium: 133 mEq/L — ABNORMAL LOW (ref 135–145)

## 2012-05-08 LAB — BRAIN NATRIURETIC PEPTIDE: Pro B Natriuretic peptide (BNP): 266 pg/mL — ABNORMAL HIGH (ref 0.0–100.0)

## 2012-05-08 NOTE — Progress Notes (Signed)
[name] Date of Birth: 16-May-1930 Medical Record #[id number]  History of Present Illness: Natalie Ho is seen back today for a work in visit. She is seen for Dr. Elease Hashimoto. She has a history of atrial fib, remote stroke, on coumadin, AI, HTN and HLD. She has most recently had hip replacement surgery.   She comes in today. She is here with her husband. She is here because 5 weeks ago, she had her hip replaced. Did ok. Was anemic. After being home for about a week, had significant fluid retention - gained 15 pounds. Has been back to the hospital. BNP was 322. She was diuresed. Did have some drop in her sodium levels. Does not sound like she was restricting her salt previously, now told to increase her salt and drink V8 juice. Was felt to have diastolic heart failure. EF remains normal. She is not having any chest pain. She says she is now back to her baseline and feels fine. She is weighing each day.   Current Outpatient Prescriptions on File Prior to Visit  Medication Sig Dispense Refill  . amLODipine (NORVASC) 2.5 MG tablet Take 2.5 mg by mouth every evening.      Marland Kitchen atorvastatin (LIPITOR) 20 MG tablet Take 1 tablet (20 mg total) by mouth at bedtime.  30 tablet  5  . digoxin (LANOXIN) 0.125 MG tablet Take 125 mcg by mouth every morning.       . furosemide (LASIX) 20 MG tablet Take 20 mg by mouth daily as needed. fluid      . nebivolol (BYSTOLIC) 10 MG tablet Take 10 mg by mouth every morning.      . nystatin-triamcinolone (MYCOLOG II) cream Apply 1 application topically daily as needed. On fungus      . omeprazole (PRILOSEC) 20 MG capsule Take 20 mg by mouth every morning.       . sertraline (ZOLOFT) 50 MG tablet Take 50 mg by mouth every morning.       . warfarin (COUMADIN) 5 MG tablet Take 5 mg by mouth every evening.       No current facility-administered medications on file prior to visit.    Allergies  Allergen Reactions  . Benzonatate Other (See Comments)    Halluccinations  . Shrimp  (Shellfish Allergy) Anaphylaxis and Swelling  . Alendronate Sodium Other (See Comments)    Unknown reaction  . Dabigatran Etexilate Mesylate Other (See Comments)    Upset stomach"Pradaxa"  . Levofloxacin Other (See Comments)    "made her funny in the head"  . Sulfonamide Derivatives Other (See Comments)    Unknown reaction  . Tramadol Other (See Comments)    Unknown reaction    Past Medical History  Diagnosis Date  . Atrial fibrillation   . Aortic insufficiency   . Hypertension   . Hyperlipidemia   . SOB (shortness of breath)   . Headache   . Gout   . GERD (gastroesophageal reflux disease)   . Colitis   . Arthritis   . Anxiety   . Chronic anticoagulation   . Stroke     TIA-3'08, none recent  . Melanoma of nose     "was only a precancer area"-no problems since    Past Surgical History  Procedure Laterality Date  . Cataracts      bilateral  . Fracture surgery      ORIF-Lt. hip -,removed hardware  . Eye surgeries      Multiple eye surgeries for torned retina-bilateral  .  Cardioversion      Unsuccessful  . Blepharoplasty      bilateral  . Colon polyps      removed via colonoscopy  . Tonsillectomy    . Total hip arthroplasty  04/03/2012    Procedure: TOTAL HIP ARTHROPLASTY ANTERIOR APPROACH;  Surgeon: Loanne Drilling, MD;  Location: [facility];  Service: Orthopedics;  Laterality: Left;    History  Smoking status  . Former Smoker -- 0.50 packs/day for 25 years  . Types: Cigarettes  . Quit date: 03/13/1990  Smokeless tobacco  . Not on file    History  Alcohol Use  . 2.5 oz/week  . 5 drink(s) per week    Comment: wine    History reviewed. No pertinent family history.  Review of Systems: The review of systems is per the HPI.  All other systems were reviewed and are negative.  Physical Exam: BP 131/80  Pulse 73  Ht 5\' 7"  (1.702 m)  Wt 142 lb (64.411 kg)  BMI 22.24 kg/m2 Patient is very pleasant and in no acute distress. She looks much younger than her  stated age. Weight is back to her baseline. Skin is warm and dry. Color is normal.  HEENT is unremarkable. Normocephalic/atraumatic. PERRL. Sclera are nonicteric. Neck is supple. No masses. No JVD. Lungs are clear. Cardiac exam shows an irregular rhythm. Her rate is well controlled. Abdomen is soft. Extremities are without edema. Gait and ROM are intact. No gross neurologic deficits noted.   LABORATORY DATA:  Echo Study Conclusions from Feb 2014  - Left ventricle: The cavity size was normal. There was mild concentric hypertrophy. Systolic function was normal. The estimated ejection fraction was in the range of 55% to 65%. Wall motion was normal; there were no regional wall motion abnormalities. Left ventricular diastolic function parameters were normal. - Aortic valve: Mild regurgitation. - Right atrium: The atrium was mildly dilated. - Pulmonary arteries: Systolic pressure was mildly increased. PA peak pressure: 46mm Hg (S).  Assessment / Plan: 1. Probable diastolic heart failure - she has improved. I have asked her to restrict her salt to less than 2000 mg a day. She does like hotdogs and cheetos apparently. Does not need to be drinking V8 juice. Need to recheck her labs today. She understands the importance of daily weights and using the Lasix just as needed.   2. Chronic atrial fib - rate is ok. Remains on coumadin  3. Hyponatremia - this was quite mild - will recheck today. I would treat with fluid restriction.  4. Recent hip replacement with anemia - rechecking today.   I think overall she is doing very well. Will get her back to see Dr. Elease Hashimoto in 2 months. Patient is agreeable to this plan and will call if any problems develop in the interim.

## 2012-05-08 NOTE — Patient Instructions (Addendum)
We are rechecking your labs today  Keep weighing daily - take the fluid pill if your weight goes up 2 to 3 pounds overnight  Try to keep your salt below 2000 mg a day  See Dr. Elease Hashimoto in 2 months  Call the [facility] Care office at [phone] if you have any questions, problems or concerns.

## 2012-05-13 ENCOUNTER — Ambulatory Visit (INDEPENDENT_AMBULATORY_CARE_PROVIDER_SITE_OTHER): Payer: Medicare Other | Admitting: *Deleted

## 2012-05-13 DIAGNOSIS — I4891 Unspecified atrial fibrillation: Secondary | ICD-10-CM

## 2012-05-13 NOTE — Patient Instructions (Signed)
Per Sabino Gasser in regards to pts previous lab from [facility]. Reduce Na intake, other labs are good

## 2012-05-14 ENCOUNTER — Ambulatory Visit: Payer: Medicare Other | Admitting: Cardiology

## 2012-06-06 ENCOUNTER — Telehealth: Payer: Self-pay | Admitting: *Deleted

## 2012-06-06 MED ORDER — WARFARIN SODIUM 5 MG PO TABS: ORAL_TABLET | ORAL | Status: DC

## 2012-06-06 NOTE — Telephone Encounter (Signed)
Pt needs refill on Warfarin

## 2012-06-06 NOTE — Telephone Encounter (Signed)
Refill done as requested 

## 2012-06-10 ENCOUNTER — Ambulatory Visit (INDEPENDENT_AMBULATORY_CARE_PROVIDER_SITE_OTHER): Payer: Medicare Other | Admitting: Pharmacist

## 2012-06-10 DIAGNOSIS — I4891 Unspecified atrial fibrillation: Secondary | ICD-10-CM

## 2012-06-10 LAB — POCT INR: INR: 2.4

## 2012-07-05 ENCOUNTER — Other Ambulatory Visit: Payer: Self-pay | Admitting: *Deleted

## 2012-07-05 MED ORDER — NEBIVOLOL HCL 10 MG PO TABS: 10.0000 mg | ORAL_TABLET | Freq: Every morning | ORAL | Status: DC

## 2012-07-09 ENCOUNTER — Ambulatory Visit (INDEPENDENT_AMBULATORY_CARE_PROVIDER_SITE_OTHER): Payer: Medicare Other | Admitting: *Deleted

## 2012-07-09 DIAGNOSIS — I4891 Unspecified atrial fibrillation: Secondary | ICD-10-CM

## 2012-07-09 LAB — POCT INR: INR: 2.3

## 2012-07-11 ENCOUNTER — Ambulatory Visit (INDEPENDENT_AMBULATORY_CARE_PROVIDER_SITE_OTHER): Payer: Medicare Other | Admitting: Cardiovascular Disease

## 2012-07-11 ENCOUNTER — Encounter: Payer: Self-pay | Admitting: Cardiovascular Disease

## 2012-07-11 VITALS — BP 138/70 | HR 59 | Ht 67.5 in | Wt 142.0 lb

## 2012-07-11 DIAGNOSIS — I10 Essential (primary) hypertension: Secondary | ICD-10-CM

## 2012-07-11 DIAGNOSIS — I4891 Unspecified atrial fibrillation: Secondary | ICD-10-CM

## 2012-07-11 NOTE — Assessment & Plan Note (Signed)
Key remains very stable. She has chronic atrial fibrillation. Her INR levels have been therapeutic. Her rate is well controlled. Continue with her medications. I'll see her again in 6 months.

## 2012-07-11 NOTE — Patient Instructions (Addendum)
Your physician wants you to follow-up in: 6 months  You will receive a reminder letter in the mail two months in advance. If you don't receive a letter, please call our office to schedule the follow-up appointment.  Your physician recommends that you continue on your current medications as directed. Please refer to the Current Medication list given to you today.  

## 2012-07-11 NOTE — Assessment & Plan Note (Signed)
Her BP is well controlled on her current meds.  She was having some low BP readings and orthostasis and stopped her benicar.  She feels much better  .

## 2012-07-11 NOTE — Progress Notes (Signed)
[name] Date of Birth  1930-10-12 [facility] [address]    [address] [place], [place]  [address] [phone]  Fax  [phone]  Problems: 1. Atrial fibrillation 2. Remote stroke 3. Aortic insufficiency 4. Hypertension 5. Hypercholesterolemia  History of Present Illness:  79 yo Hx of a-Fib, remote stroke, HTN, hypercholesterolemia.  Her last visit we added amlodipine 2.5 mg a day. She also started taking her Benicar 20 mg twice a day instead of 40 mg at once. She is feeling quite a bit better. Her blood pressure readings have stabilized. She still occasionally has some signs of mild hypertension with a systolic blood pressure 140.      She also has episodes where her blood pressure will become fairly low especially at night. They're given some nights when she has held her medication for several hours because her blood pressure was in the 90 range.  She's not been limited by chest pain or shortness of breath.  She has been having some problems with hypertension.  We added amlodipine 2.5 mg at her last visit.  Her blood pressures have been better.    Jul 11, 2012:  She has done well after her left hip replacement.   She has some dyspnea but is overall doing well.   She has stopped her Benicar and is feeling much better.  She was having low BP readings.    Current Outpatient Prescriptions on File Prior to Visit  Medication Sig Dispense Refill  . amLODipine (NORVASC) 2.5 MG tablet Take 2.5 mg by mouth every evening.      Marland Kitchen atorvastatin (LIPITOR) 20 MG tablet Take 1 tablet (20 mg total) by mouth at bedtime.  30 tablet  5  . digoxin (LANOXIN) 0.125 MG tablet Take 125 mcg by mouth every morning.       . nebivolol (BYSTOLIC) 10 MG tablet Take 1 tablet (10 mg total) by mouth every morning.  30 tablet  6  . nystatin-triamcinolone (MYCOLOG II) cream Apply 1 application topically daily as needed. On fungus      . omeprazole (PRILOSEC) 20 MG capsule Take 20 mg by mouth every  morning.       . sertraline (ZOLOFT) 50 MG tablet Take 50 mg by mouth every morning.       . warfarin (COUMADIN) 5 MG tablet Take as directed by coumadin clinic  30 tablet  3   No current facility-administered medications on file prior to visit.    Allergies  Allergen Reactions  . Benzonatate Other (See Comments)    Halluccinations  . Shrimp (Shellfish Allergy) Anaphylaxis and Swelling  . Alendronate Sodium Other (See Comments)    Unknown reaction  . Dabigatran Etexilate Mesylate Other (See Comments)    Upset stomach"Pradaxa"  . Levofloxacin Other (See Comments)    "made her funny in the head"  . Sulfonamide Derivatives Other (See Comments)    Unknown reaction  . Tramadol Other (See Comments)    Unknown reaction    Past Medical History  Diagnosis Date  . Atrial fibrillation   . Aortic insufficiency   . Hypertension   . Hyperlipidemia   . SOB (shortness of breath)   . Headache   . Gout   . GERD (gastroesophageal reflux disease)   . Colitis   . Arthritis   . Anxiety   . Chronic anticoagulation   . Stroke     TIA-3'08, none recent  . Melanoma of nose     "was only a  precancer area"-no problems since    Past Surgical History  Procedure Laterality Date  . Cataracts      bilateral  . Fracture surgery      ORIF-Lt. hip -,removed hardware  . Eye surgeries      Multiple eye surgeries for torned retina-bilateral  . Cardioversion      Unsuccessful  . Blepharoplasty      bilateral  . Colon polyps      removed via colonoscopy  . Tonsillectomy    . Total hip arthroplasty  04/03/2012    Procedure: TOTAL HIP ARTHROPLASTY ANTERIOR APPROACH;  Surgeon: Loanne Drilling, MD;  Location: [facility];  Service: Orthopedics;  Laterality: Left;    History  Smoking status  . Former Smoker -- 0.50 packs/day for 25 years  . Types: Cigarettes  . Quit date: 03/13/1990  Smokeless tobacco  . Not on file    History  Alcohol Use  . 2.5 oz/week  . 5 drink(s) per week    Comment:  wine    No family history on file.  Reviw of Systems:  Reviewed in the HPI.  All other systems are negative.  Physical Exam: BP 138/70  Pulse 59  Ht 5' 7.5" (1.715 m)  Wt 142 lb (64.411 kg)  BMI 21.9 kg/m2 The patient is alert and oriented x 3.  The mood and affect are normal.   Skin: warm and dry.  Color is normal.    HEENT:   the sclera are nonicteric.  The mucous membranes are moist.  The carotids are 2+ without bruits.  There is no thyromegaly.  There is no JVD.    Lungs: clear.  The chest wall is non tender.    Heart: Irregularly irregular normal S1-S2.Marland Kitchen  There are no murmurs, gallops, or rubs. The PMI is not displaced.     Abdomen: good bowel sounds.  There is no guarding or rebound.  There is no hepatosplenomegaly or tenderness.  There are no masses.   Extremities:  no clubbing, cyanosis, or edema.  The legs are without rashes.  The distal pulses are intact.   Neuro:  Cranial nerves II - XII are intact.  Motor and sensory functions are intact.    The gait is normal.  ECG:  Jul 11, 2012:  Atrial fib at rate of 59.   Assessment / Plan:

## 2012-08-06 ENCOUNTER — Ambulatory Visit (INDEPENDENT_AMBULATORY_CARE_PROVIDER_SITE_OTHER): Payer: Medicare Other | Admitting: Pharmacist

## 2012-08-06 DIAGNOSIS — I4891 Unspecified atrial fibrillation: Secondary | ICD-10-CM

## 2012-08-16 ENCOUNTER — Other Ambulatory Visit: Payer: Self-pay | Admitting: *Deleted

## 2012-08-16 MED ORDER — AMLODIPINE BESYLATE 2.5 MG PO TABS: 2.5000 mg | ORAL_TABLET | Freq: Every evening | ORAL | Status: DC

## 2012-08-16 NOTE — Telephone Encounter (Signed)
Fax Received. Refill Completed. Gaetana Kawahara Chowoe (R.M.A)   

## 2012-09-03 ENCOUNTER — Ambulatory Visit (INDEPENDENT_AMBULATORY_CARE_PROVIDER_SITE_OTHER): Payer: Medicare Other | Admitting: *Deleted

## 2012-09-03 DIAGNOSIS — I4891 Unspecified atrial fibrillation: Secondary | ICD-10-CM

## 2012-09-03 LAB — POCT INR: INR: 2.4

## 2012-10-01 ENCOUNTER — Ambulatory Visit (INDEPENDENT_AMBULATORY_CARE_PROVIDER_SITE_OTHER): Payer: Medicare Other | Admitting: *Deleted

## 2012-10-01 DIAGNOSIS — I4891 Unspecified atrial fibrillation: Secondary | ICD-10-CM

## 2012-10-01 LAB — POCT INR: INR: 2.3

## 2012-10-04 ENCOUNTER — Other Ambulatory Visit: Payer: Self-pay | Admitting: *Deleted

## 2012-10-04 MED ORDER — WARFARIN SODIUM 5 MG PO TABS: ORAL_TABLET | ORAL | Status: DC

## 2012-10-09 ENCOUNTER — Other Ambulatory Visit: Payer: Self-pay | Admitting: *Deleted

## 2012-10-09 MED ORDER — ATORVASTATIN CALCIUM 20 MG PO TABS: 20.0000 mg | ORAL_TABLET | Freq: Every day | ORAL | Status: DC

## 2012-10-09 NOTE — Telephone Encounter (Signed)
Fax Received. Refill Completed. Natalie Ho (R.M.A)   

## 2012-10-10 ENCOUNTER — Telehealth: Payer: Self-pay | Admitting: Cardiovascular Disease

## 2012-10-10 MED ORDER — NEBIVOLOL HCL 5 MG PO TABS: 5.0000 mg | ORAL_TABLET | Freq: Every morning | ORAL | Status: DC

## 2012-10-10 NOTE — Telephone Encounter (Signed)
Pt has two questions, She saw her optometrist, states that the plavix / low bp is contributing to her glaucoma. plavix is not on her med list/ was taken off 03/2012. Pt has continued to take.  Should she be taking plavix? C/o low bp/p and feeling dizzy and fatigued.  bp range 94/45 - 118/60 average 106/45 p 53-64 Please advise.

## 2012-10-10 NOTE — Telephone Encounter (Signed)
New problem  Per pt call her pcp informed her that one of the medications that dr Elease Hashimoto has her taking is effecting her vision-pt wants to come in-offered pt 1st avail w/pa in august but she said that was too long

## 2012-10-10 NOTE — Telephone Encounter (Signed)
STOP PLAVIX  REDUCE BYSTOLIC TO 5 MG DAILY. PT VERBALIZED UNDERSTANDING

## 2012-11-06 ENCOUNTER — Ambulatory Visit (INDEPENDENT_AMBULATORY_CARE_PROVIDER_SITE_OTHER): Payer: Medicare Other | Admitting: Pharmacist

## 2012-11-06 DIAGNOSIS — I4891 Unspecified atrial fibrillation: Secondary | ICD-10-CM

## 2012-11-08 ENCOUNTER — Telehealth: Payer: Self-pay | Admitting: Cardiovascular Disease

## 2012-11-08 MED ORDER — DIGOXIN 125 MCG PO TABS: 125.0000 ug | ORAL_TABLET | Freq: Every morning | ORAL | Status: DC

## 2012-11-08 NOTE — Telephone Encounter (Signed)
Fax Received. Refill Completed. Natalie Ho (R.M.A)   

## 2012-11-08 NOTE — Telephone Encounter (Signed)
Pt's said pharmacy requested refill for the past three days and is now out of digoxin, pls call in asap to [facility]

## 2012-11-16 ENCOUNTER — Emergency Department (INDEPENDENT_AMBULATORY_CARE_PROVIDER_SITE_OTHER)
Admission: EM | Admit: 2012-11-16 | Discharge: 2012-11-16 | Disposition: A | Discharge status: Home / Self Care | Payer: Medicare Other | Source: Home / Self Care

## 2012-11-16 ENCOUNTER — Encounter (HOSPITAL_COMMUNITY): Payer: Self-pay | Admitting: Emergency Medicine

## 2012-11-16 DIAGNOSIS — R0789 Other chest pain: Secondary | ICD-10-CM

## 2012-11-16 LAB — CBC WITH DIFFERENTIAL/PLATELET
Basophils Relative: 2 % — ABNORMAL HIGH (ref 0–1)
Eosinophils Absolute: 0.1 10*3/uL (ref 0.0–0.7)
Eosinophils Relative: 2 % (ref 0–5)
MCH: 28.2 pg (ref 26.0–34.0)
MCHC: 33.7 g/dL (ref 30.0–36.0)
MCV: 83.6 fL (ref 78.0–100.0)
Neutrophils Relative %: 54 % (ref 43–77)
Platelets: 229 10*3/uL (ref 150–400)

## 2012-11-16 LAB — COMPREHENSIVE METABOLIC PANEL
ALT: 19 U/L (ref 0–35)
AST: 26 U/L (ref 0–37)
Albumin: 4 g/dL (ref 3.5–5.2)
Alkaline Phosphatase: 81 U/L (ref 39–117)
CO2: 29 mEq/L (ref 19–32)
Chloride: 93 mEq/L — ABNORMAL LOW (ref 96–112)
GFR calc non Af Amer: 79 mL/min — ABNORMAL LOW (ref >90)
Potassium: 4.5 mEq/L (ref 3.5–5.1)
Sodium: 130 mEq/L — ABNORMAL LOW (ref 135–145)
Total Bilirubin: 0.6 mg/dL (ref 0.3–1.2)

## 2012-11-16 LAB — LIPASE, BLOOD: Lipase: 30 U/L (ref 11–59)

## 2012-11-16 MED ORDER — GI COCKTAIL ~~LOC~~
30.0000 mL | Freq: Once | ORAL | Status: AC
  Administered 2012-11-16: 30 mL via ORAL

## 2012-11-16 MED ORDER — GI COCKTAIL ~~LOC~~
ORAL | Status: AC
  Filled 2012-11-16: qty 30

## 2012-11-16 NOTE — ED Provider Notes (Signed)
CSN: [id number]     Arrival date & time 11/16/12  1434 History   None    Chief Complaint  Patient presents with  . Abdominal Pain    since monday. nausea.    (Consider location/radiation/quality/duration/timing/severity/associated sxs/prior Treatment) HPI Comments: Patient is a 79 yo caucasian female with a past medical history of HTN, CAD, A-fib and GERD who presents with a 5 day history of epigastric pain. Pain is described as "pressure". Initially came and went, now constant. Non-radiating. No SOB, diaphoresis, N/V, bowel changes, urinary symptoms. Eating does not affect. Change in position does not affect. No pain with deep breaths. No alleviating factors are presents.    Patient is a 79 y.o. female presenting with abdominal pain. The history is provided by the patient.  Abdominal Pain Associated symptoms include abdominal pain.    Past Medical History  Diagnosis Date  . Atrial fibrillation   . Aortic insufficiency   . Hypertension   . Hyperlipidemia   . SOB (shortness of breath)   . Headache(784.0)   . Gout   . GERD (gastroesophageal reflux disease)   . Colitis   . Arthritis   . Anxiety   . Chronic anticoagulation   . Stroke     TIA-3'08, none recent  . Melanoma of nose     "was only a precancer area"-no problems since   Past Surgical History  Procedure Laterality Date  . Cataracts      bilateral  . Fracture surgery      ORIF-Lt. hip -,removed hardware  . Eye surgeries      Multiple eye surgeries for torned retina-bilateral  . Cardioversion      Unsuccessful  . Blepharoplasty      bilateral  . Colon polyps      removed via colonoscopy  . Tonsillectomy    . Total hip arthroplasty  04/03/2012    Procedure: TOTAL HIP ARTHROPLASTY ANTERIOR APPROACH;  Surgeon: Loanne Drilling, MD;  Location: [facility];  Service: Orthopedics;  Laterality: Left;   History reviewed. No pertinent family history. History  Substance Use Topics  . Smoking status: Former Smoker -- 0.50  packs/day for 25 years    Types: Cigarettes    Quit date: 03/13/1990  . Smokeless tobacco: Not on file  . Alcohol Use: 2.5 oz/week    5 drink(s) per week     Comment: wine   OB History   Grav Para Term Preterm Abortions TAB SAB Ect Mult Living                 Review of Systems  Constitutional: Negative for fever, chills and fatigue.  Respiratory: Negative.   Cardiovascular: Negative for palpitations and leg swelling.  Gastrointestinal: Positive for abdominal pain. Negative for nausea, vomiting, diarrhea and abdominal distention.  Genitourinary: Negative.   Musculoskeletal: Negative for back pain.  Skin: Negative.   Neurological: Negative.   Hematological: Negative.   Psychiatric/Behavioral: Negative.     Allergies  Benzonatate; Shrimp; Alendronate sodium; Dabigatran etexilate mesylate; Levofloxacin; Sulfonamide derivatives; and Tramadol  Home Medications   Current Outpatient Rx  Name  Route  Sig  Dispense  Refill  . amLODipine (NORVASC) 2.5 MG tablet   Oral   Take 1 tablet (2.5 mg total) by mouth every evening.   30 tablet   6   . omeprazole (PRILOSEC) 20 MG capsule   Oral   Take 20 mg by mouth every morning.          Marland Kitchen atorvastatin (LIPITOR)  20 MG tablet   Oral   Take 1 tablet (20 mg total) by mouth at bedtime.   30 tablet   5   . digoxin (LANOXIN) 0.125 MG tablet   Oral   Take 1 tablet (125 mcg total) by mouth every morning.   30 tablet   3   . nebivolol (BYSTOLIC) 5 MG tablet   Oral   Take 1 tablet (5 mg total) by mouth every morning.   30 tablet   6     REDUCED DOSE   . nystatin-triamcinolone (MYCOLOG II) cream   Topical   Apply 1 application topically daily as needed. On fungus         . sertraline (ZOLOFT) 50 MG tablet   Oral   Take 50 mg by mouth every morning.          . warfarin (COUMADIN) 5 MG tablet      Take as directed by coumadin clinic   30 tablet   3    There were no vitals taken for this visit. Physical Exam   Nursing note and vitals reviewed. Constitutional: She is oriented to person, place, and time. She appears well-developed and well-nourished. No distress.  HENT:  Head: Normocephalic and atraumatic.  Eyes: Pupils are equal, round, and reactive to light.  Neck: Normal range of motion.  Cardiovascular: Normal rate, regular rhythm and normal heart sounds.  Exam reveals no gallop and no friction rub.   No murmur heard. Pulmonary/Chest: Effort normal and breath sounds normal.  Abdominal: Soft. Bowel sounds are normal. She exhibits no distension and no mass. There is no tenderness. There is no rebound and no guarding.  Neurological: She is alert and oriented to person, place, and time.  Skin: Skin is warm and dry. She is not diaphoretic.  Psychiatric: Her behavior is normal.    ED Course  Procedures (including critical care time) Labs Review Labs Reviewed  COMPREHENSIVE METABOLIC PANEL - Abnormal; Notable for the following:    Sodium 130 (*)    Chloride 93 (*)    GFR calc non Af Amer 79 (*)    All other components within normal limits  CBC WITH DIFFERENTIAL - Abnormal; Notable for the following:    Basophils Relative 2 (*)    All other components within normal limits  LIPASE, BLOOD   Imaging Review No results found.  MDM   1. Chest pain, atypical   Discussed with Dr. Artis Flock, Given patient's history and presentation of atypical chest/epigastric pain recommend [facility]. No change in EKG when compared to the previous in May. I spoke with paatient has decided to wait it out at home. Dr. Lillia Abed has been Nice enough to check labs and send her home and call with any abnormalities, patient is willing and will also try a GI cocktail as well.    Azucena Fallen, PA-C 11/16/12 1612   CMP     Component Value Date/Time   NA 130* 11/16/2012 1611   K 4.5 11/16/2012 1611   CL 93* 11/16/2012 1611   CO2 29 11/16/2012 1611   GLUCOSE 96 11/16/2012 1611   BUN 20 11/16/2012 1611    CREATININE 0.72 11/16/2012 1611   CALCIUM 9.4 11/16/2012 1611   PROT 7.3 11/16/2012 1611   ALBUMIN 4.0 11/16/2012 1611   AST 26 11/16/2012 1611   ALT 19 11/16/2012 1611   ALKPHOS 81 11/16/2012 1611   BILITOT 0.6 11/16/2012 1611   GFRNONAA 79* 11/16/2012 1611  GFRAA >90 11/16/2012 1611    Lipase     Component Value Date/Time   LIPASE 30 11/16/2012 1611    CBC    Component Value Date/Time   WBC 4.7 11/16/2012 1611   RBC 4.83 11/16/2012 1611   HGB 13.6 11/16/2012 1611   HCT 40.4 11/16/2012 1611   PLT 229 11/16/2012 1611   MCV 83.6 11/16/2012 1611   MCH 28.2 11/16/2012 1611   MCHC 33.7 11/16/2012 1611   RDW 15.5 11/16/2012 1611   LYMPHSABS 1.5 11/16/2012 1611   MONOABS 0.5 11/16/2012 1611   EOSABS 0.1 11/16/2012 1611   BASOSABS 0.1 11/16/2012 1611  I called the patient regarding her normal labs. She is well. She will follow up with her PCP Monday. She will present to the ED if she worsens. She expresses understanding and agreement.   Medical screening examination/treatment/procedure(s) were performed by a resident physician or non-physician practitioner and as the supervising physician I was immediately available for consultation/collaboration.  Clementeen Graham, MD   Rodolph Bong, MD 11/16/12 [phone]

## 2012-11-16 NOTE — ED Notes (Signed)
C/o epigastric pain that started on Monday. States she noticed pain when she woke in the morning. Gradually gotten worse and now having nausea. States "pain is there all the time". Worse with lying flat.  Denies chest pain or any unusual sob. States always a little sob due to age. Pt has not tried any otc meds for symptoms.

## 2012-11-21 ENCOUNTER — Other Ambulatory Visit: Payer: Self-pay | Admitting: Family Medicine

## 2012-11-21 DIAGNOSIS — K219 Gastro-esophageal reflux disease without esophagitis: Secondary | ICD-10-CM

## 2012-11-27 ENCOUNTER — Ambulatory Visit
Admission: RE | Admit: 2012-11-27 | Discharge: 2012-11-27 | Disposition: A | Discharge status: Home / Self Care | Payer: Medicare Other | Source: Ambulatory Visit | Attending: Family Medicine | Admitting: Family Medicine

## 2012-11-27 DIAGNOSIS — K219 Gastro-esophageal reflux disease without esophagitis: Secondary | ICD-10-CM

## 2012-12-04 ENCOUNTER — Emergency Department (HOSPITAL_COMMUNITY)
Admission: EM | Admit: 2012-12-04 | Discharge: 2012-12-04 | Disposition: A | Discharge status: Home / Self Care | Payer: Medicare Other | Attending: Emergency Medicine | Admitting: Emergency Medicine

## 2012-12-04 ENCOUNTER — Encounter (HOSPITAL_COMMUNITY): Payer: Self-pay

## 2012-12-04 DIAGNOSIS — Z7982 Long term (current) use of aspirin: Secondary | ICD-10-CM | POA: Insufficient documentation

## 2012-12-04 DIAGNOSIS — Z87891 Personal history of nicotine dependence: Secondary | ICD-10-CM | POA: Insufficient documentation

## 2012-12-04 DIAGNOSIS — Z8639 Personal history of other endocrine, nutritional and metabolic disease: Secondary | ICD-10-CM | POA: Insufficient documentation

## 2012-12-04 DIAGNOSIS — R58 Hemorrhage, not elsewhere classified: Secondary | ICD-10-CM

## 2012-12-04 DIAGNOSIS — F411 Generalized anxiety disorder: Secondary | ICD-10-CM | POA: Insufficient documentation

## 2012-12-04 DIAGNOSIS — S61209A Unspecified open wound of unspecified finger without damage to nail, initial encounter: Secondary | ICD-10-CM | POA: Insufficient documentation

## 2012-12-04 DIAGNOSIS — Y929 Unspecified place or not applicable: Secondary | ICD-10-CM | POA: Insufficient documentation

## 2012-12-04 DIAGNOSIS — I10 Essential (primary) hypertension: Secondary | ICD-10-CM | POA: Insufficient documentation

## 2012-12-04 DIAGNOSIS — Z85820 Personal history of malignant melanoma of skin: Secondary | ICD-10-CM | POA: Insufficient documentation

## 2012-12-04 DIAGNOSIS — Z79899 Other long term (current) drug therapy: Secondary | ICD-10-CM | POA: Insufficient documentation

## 2012-12-04 DIAGNOSIS — M109 Gout, unspecified: Secondary | ICD-10-CM | POA: Insufficient documentation

## 2012-12-04 DIAGNOSIS — W278XXA Contact with other nonpowered hand tool, initial encounter: Secondary | ICD-10-CM | POA: Insufficient documentation

## 2012-12-04 DIAGNOSIS — Y9389 Activity, other specified: Secondary | ICD-10-CM | POA: Insufficient documentation

## 2012-12-04 DIAGNOSIS — E785 Hyperlipidemia, unspecified: Secondary | ICD-10-CM | POA: Insufficient documentation

## 2012-12-04 DIAGNOSIS — I4891 Unspecified atrial fibrillation: Secondary | ICD-10-CM | POA: Insufficient documentation

## 2012-12-04 DIAGNOSIS — D689 Coagulation defect, unspecified: Secondary | ICD-10-CM | POA: Insufficient documentation

## 2012-12-04 DIAGNOSIS — K219 Gastro-esophageal reflux disease without esophagitis: Secondary | ICD-10-CM | POA: Insufficient documentation

## 2012-12-04 DIAGNOSIS — Z7901 Long term (current) use of anticoagulants: Secondary | ICD-10-CM | POA: Insufficient documentation

## 2012-12-04 DIAGNOSIS — Z862 Personal history of diseases of the blood and blood-forming organs and certain disorders involving the immune mechanism: Secondary | ICD-10-CM | POA: Insufficient documentation

## 2012-12-04 DIAGNOSIS — Z8673 Personal history of transient ischemic attack (TIA), and cerebral infarction without residual deficits: Secondary | ICD-10-CM | POA: Insufficient documentation

## 2012-12-04 DIAGNOSIS — S61219A Laceration without foreign body of unspecified finger without damage to nail, initial encounter: Secondary | ICD-10-CM

## 2012-12-04 HISTORY — DX: Transient cerebral ischemic attack, unspecified: G45.9

## 2012-12-04 LAB — PROTIME-INR: Prothrombin Time: 25.5 seconds — ABNORMAL HIGH (ref 11.6–15.2)

## 2012-12-04 NOTE — ED Notes (Signed)
Patient's left thumb placed in saline. Prior to placing in saline patient's thumb was trickling blood.

## 2012-12-04 NOTE — ED Notes (Signed)
Patient reports that she was trying to get plastic off of an item with scissors and the scissors slipped, cutting the end of her left thumb. Patient is on Coumadin and reports that her left thumb bled most of the night. Patient saw her PCP today and was told to come to the ED for further evaluation and treatment.

## 2012-12-04 NOTE — ED Provider Notes (Addendum)
CSN: [id number]     Arrival date & time 12/04/12  1104 History   First MD Initiated Contact with Patient 12/04/12 1147     Chief Complaint  Patient presents with  . Laceration    left thumb   (Consider location/radiation/quality/duration/timing/severity/associated sxs/prior Treatment) HPI Comments: 79 yo female with finger laceration after cutting on scissors, Tetanus not UTD- given in ED.  Mild continuous bleeding tip of thumb on left.  No other injuries.  Pt sent in by pcp.  Improves with pressure. On coumadin for a fib.   Patient is a 79 y.o. female presenting with skin laceration. The history is provided by the patient.  Laceration Location:  Finger   Past Medical History  Diagnosis Date  . Atrial fibrillation   . Aortic insufficiency   . Hypertension   . Hyperlipidemia   . SOB (shortness of breath)   . Headache(784.0)   . Gout   . GERD (gastroesophageal reflux disease)   . Colitis   . Arthritis   . Anxiety   . Chronic anticoagulation   . Stroke     TIA-3'08, none recent  . Melanoma of nose     "was only a precancer area"-no problems since  . TIA (transient ischemic attack)    Past Surgical History  Procedure Laterality Date  . Cataracts      bilateral  . Fracture surgery      ORIF-Lt. hip -,removed hardware  . Eye surgeries      Multiple eye surgeries for torned retina-bilateral  . Cardioversion      Unsuccessful  . Blepharoplasty      bilateral  . Colon polyps      removed via colonoscopy  . Tonsillectomy    . Total hip arthroplasty  04/03/2012    Procedure: TOTAL HIP ARTHROPLASTY ANTERIOR APPROACH;  Surgeon: Loanne Drilling, MD;  Location: [facility];  Service: Orthopedics;  Laterality: Left;   History reviewed. No pertinent family history. History  Substance Use Topics  . Smoking status: Former Smoker -- 0.50 packs/day for 25 years    Types: Cigarettes    Quit date: 03/13/1990  . Smokeless tobacco: Not on file  . Alcohol Use: 2.5 oz/week    5 drink(s)  per week     Comment: wine   OB History   Grav Para Term Preterm Abortions TAB SAB Ect Mult Living                 Review of Systems  Constitutional: Negative for fever and chills.  Respiratory: Negative for shortness of breath.   Cardiovascular: Negative for chest pain.  Skin: Positive for wound.  Neurological: Negative for weakness, numbness and headaches.    Allergies  Benzonatate; Shrimp; Alendronate sodium; Dabigatran etexilate mesylate; Levofloxacin; Sulfonamide derivatives; and Tramadol  Home Medications   Current Outpatient Rx  Name  Route  Sig  Dispense  Refill  . amLODipine (NORVASC) 2.5 MG tablet   Oral   Take 1 tablet (2.5 mg total) by mouth every evening.   30 tablet   6   . aspirin EC 81 MG tablet   Oral   Take 81 mg by mouth daily.         Marland Kitchen atorvastatin (LIPITOR) 20 MG tablet   Oral   Take 1 tablet (20 mg total) by mouth at bedtime.   30 tablet   5   . calcium citrate (CALCITRATE - DOSED IN MG ELEMENTAL CALCIUM) 950 MG tablet   Oral  Take 1 tablet by mouth daily.         . cholecalciferol (VITAMIN D) 1000 UNITS tablet   Oral   Take 1,000 Units by mouth daily.         . digoxin (LANOXIN) 0.125 MG tablet   Oral   Take 1 tablet (125 mcg total) by mouth every morning.   30 tablet   3   . nebivolol (BYSTOLIC) 5 MG tablet   Oral   Take 1 tablet (5 mg total) by mouth every morning.   30 tablet   6     REDUCED DOSE   . nystatin-triamcinolone (MYCOLOG II) cream   Topical   Apply 1 application topically daily as needed. On fungus         . omeprazole (PRILOSEC) 20 MG capsule   Oral   Take 20 mg by mouth 2 (two) times daily.          . sertraline (ZOLOFT) 50 MG tablet   Oral   Take 50 mg by mouth every morning.          . warfarin (COUMADIN) 5 MG tablet   Oral   Take 2.5-5 mg by mouth daily. Take 2.5mg  on Wed & Sat. All other days including Sunday take 5mg .          BP 159/90  Pulse 59  Temp(Src) 98 F (36.7 C)  (Oral)  Resp 18  Ht 5\' 7"  (1.702 m)  Wt 140 lb (63.504 kg)  BMI 21.92 kg/m2  SpO2 94% Physical Exam  Nursing note and vitals reviewed. Constitutional: She appears well-developed and well-nourished.  HENT:  Head: Normocephalic and atraumatic.  Eyes: Conjunctivae are normal. Right eye exhibits no discharge. Left eye exhibits no discharge.  Neck: Normal range of motion. No tracheal deviation present.  Cardiovascular: Normal rate and regular rhythm.   Musculoskeletal: She exhibits tenderness. She exhibits no edema.  Neurological: She is alert.  Skin: Skin is warm. No rash noted.  1 cm superficial abrasion/ laceration to distal left thumb, nv intact, full rom of pip and dip of thumb  Psychiatric: She has a normal mood and affect.    ED Course  Procedures (including critical care time) LACERATION REPAIR Performed by: Enid Skeens Authorized by: Enid Skeens Consent: Verbal consent obtained. Risks and benefits: risks, benefits and alternatives were discussed Consent given by: patient Patient identity confirmed: provided demographic data Prepped and Draped in normal sterile fashion Wound explored  Laceration Location: left thumb Laceration Length: 1cm No Foreign Bodies seen or palpated Anesthesia: local infiltration Local anesthetic none Amount of cleaning: standard Skin glue covered  Technique: skin glue dermabond  Patient tolerance: Patient tolerated the procedure well with no immediate complications.   Labs Review Labs Reviewed  PROTIME-INR - Abnormal; Notable for the following:    Prothrombin Time 25.5 (*)    INR 2.42 (*)    All other components within normal limits   Imaging Review No results found.  MDM   1. Finger laceration, initial encounter   2. Bleeding on Coumadin    INR appropriate. Laceration not amenable to stiches.  Skin glue used and non adherent dressing given to pt.  Well appearing in ED.  DC    Enid Skeens, MD 12/04/12  6045  Enid Skeens, MD 12/04/12 [phone]

## 2012-12-17 ENCOUNTER — Ambulatory Visit (INDEPENDENT_AMBULATORY_CARE_PROVIDER_SITE_OTHER): Payer: Medicare Other | Admitting: General Surgery

## 2012-12-17 ENCOUNTER — Ambulatory Visit (INDEPENDENT_AMBULATORY_CARE_PROVIDER_SITE_OTHER): Payer: Medicare Other | Admitting: *Deleted

## 2012-12-17 DIAGNOSIS — I4891 Unspecified atrial fibrillation: Secondary | ICD-10-CM

## 2013-01-09 ENCOUNTER — Ambulatory Visit (INDEPENDENT_AMBULATORY_CARE_PROVIDER_SITE_OTHER): Payer: Medicare Other | Admitting: General Surgery

## 2013-01-09 ENCOUNTER — Telehealth (INDEPENDENT_AMBULATORY_CARE_PROVIDER_SITE_OTHER): Payer: Self-pay

## 2013-01-09 ENCOUNTER — Encounter (INDEPENDENT_AMBULATORY_CARE_PROVIDER_SITE_OTHER): Payer: Self-pay

## 2013-01-09 ENCOUNTER — Encounter (INDEPENDENT_AMBULATORY_CARE_PROVIDER_SITE_OTHER): Payer: Self-pay | Admitting: General Surgery

## 2013-01-09 VITALS — BP 160/90 | HR 72 | Temp 98.9°F | Resp 15 | Ht 67.5 in | Wt 138.6 lb

## 2013-01-09 DIAGNOSIS — K8020 Calculus of gallbladder without cholecystitis without obstruction: Secondary | ICD-10-CM

## 2013-01-09 NOTE — Progress Notes (Signed)
Subjective:   Abdominal pain, gallstones  Patient ID: Natalie Ho, female   DOB: 1930/09/23, 79 y.o.   MRN: [id number]  HPI Patient is a very pleasant 79 year old female referred by Dr. Cyndia Bent 4 cholelithiasis and a recent episode of abdominal pain. The patient is accompanied by her husband. They state that about 4-6 weeks ago she developed the onset the onset of somewhat vague but significant mid abdominal pain. This waxed and waned but was daily and rated about 4/10. For some slight nausea without vomiting. This lasted for about 2-3 weeks and then resolved. For the last couple of weeks she has been entirely asymptomatic with no pain or GI symptoms. She had had a previous diagnosis of cholelithiasis but does not recall exactly how this came up, probably incidental from another x-ray study. Recent abdominal ultrasound was obtained which shows multiple gallstones without gallbladder wall thickening and normal common bile duct. The patient generally is active and currently feels well.  Past Medical History  Diagnosis Date  . Atrial fibrillation   . Aortic insufficiency   . Hypertension   . Hyperlipidemia   . SOB (shortness of breath)   . Headache(784.0)   . Gout   . GERD (gastroesophageal reflux disease)   . Colitis   . Arthritis   . Anxiety   . Chronic anticoagulation   . Stroke     TIA-3'08, none recent  . Melanoma of nose     "was only a precancer area"-no problems since  . TIA (transient ischemic attack)    Past Surgical History  Procedure Laterality Date  . Cataracts      bilateral  . Fracture surgery      ORIF-Lt. hip -,removed hardware  . Eye surgeries      Multiple eye surgeries for torned retina-bilateral  . Cardioversion      Unsuccessful  . Blepharoplasty      bilateral  . Colon polyps      removed via colonoscopy  . Tonsillectomy    . Total hip arthroplasty  04/03/2012    Procedure: TOTAL HIP ARTHROPLASTY ANTERIOR APPROACH;  Surgeon: Loanne Drilling, MD;   Location: [facility];  Service: Orthopedics;  Laterality: Left;   Current Outpatient Prescriptions  Medication Sig Dispense Refill  . amLODipine (NORVASC) 2.5 MG tablet Take 1 tablet (2.5 mg total) by mouth every evening.  30 tablet  6  . aspirin EC 81 MG tablet Take 81 mg by mouth daily.      Marland Kitchen atorvastatin (LIPITOR) 20 MG tablet Take 1 tablet (20 mg total) by mouth at bedtime.  30 tablet  5  . calcium citrate (CALCITRATE - DOSED IN MG ELEMENTAL CALCIUM) 950 MG tablet Take 1 tablet by mouth daily.      . cholecalciferol (VITAMIN D) 1000 UNITS tablet Take 1,000 Units by mouth daily.      . digoxin (LANOXIN) 0.125 MG tablet Take 1 tablet (125 mcg total) by mouth every morning.  30 tablet  3  . nebivolol (BYSTOLIC) 5 MG tablet Take 1 tablet (5 mg total) by mouth every morning.  30 tablet  6  . nystatin-triamcinolone (MYCOLOG II) cream Apply 1 application topically daily as needed. On fungus      . omeprazole (PRILOSEC) 20 MG capsule Take 20 mg by mouth 2 (two) times daily.       . sertraline (ZOLOFT) 50 MG tablet Take 50 mg by mouth every morning.       . warfarin (COUMADIN) 5 MG tablet  Take 2.5-5 mg by mouth daily. Take 2.5mg  on Wed & Sat. All other days including Sunday take 5mg .       No current facility-administered medications for this visit.   Allergies  Allergen Reactions  . Benzonatate Other (See Comments)    Halluccinations  . Shrimp [Shellfish Allergy] Anaphylaxis and Swelling  . Alendronate Sodium Other (See Comments)    Unknown reaction  . Dabigatran Etexilate Mesylate Other (See Comments)    Upset stomach"Pradaxa"  . Levofloxacin Other (See Comments)    "made her funny in the head"  . Sulfonamide Derivatives Other (See Comments)    Unknown reaction  . Tramadol Other (See Comments)    Unknown reaction   History  Substance Use Topics  . Smoking status: Former Smoker -- 0.50 packs/day for 25 years    Types: Cigarettes    Quit date: 03/13/1990  . Smokeless tobacco: Not on  file  . Alcohol Use: 2.5 oz/week    5 drink(s) per week     Comment: wine daily     Review of Systems  Respiratory: Negative.   Cardiovascular: Negative.   Gastrointestinal: Positive for abdominal pain.  Genitourinary: Negative.        Objective:   Physical Exam BP 160/90  Pulse 72  Temp(Src) 98.9 F (37.2 C) (Temporal)  Resp 15  Ht 5' 7.5" (1.715 m)  Wt 138 lb 9.6 oz (62.869 kg)  BMI 21.38 kg/m2 General: Alert, Thin Caucasian female appearing her stated age, in no distress Skin: Warm and dry without rash or infection. HEENT: No palpable masses or thyromegaly. Sclera nonicteric. Pupils equal round and reactive. Oropharynx clear. Lymph nodes: No cervical, supraclavicular, or inguinal nodes palpable. Lungs: Breath sounds clear and equal without increased work of breathing Cardiovascular: Regular rate and rhythm without murmur. No JVD or edema. Peripheral pulses intact. Abdomen: Nondistended. Soft and nontender. No masses palpable. No organomegaly. No palpable hernias. Extremities: No edema or joint swelling or deformity. No chronic venous stasis changes. Neurologic: Alert and fully oriented. Gait normal.    Assessment:     Recent status abdominal pain lasting several weeks consistent with biliary colic or chronic cholecystitis. Ultrasound has confirmed multiple gallstones. She currently is asymptomatic. I long discussion with the patient and her husband regarding pros and cons of proceeding with surgery. She does have atrial fibrillation and would require temporary cessation of her anticoagulation. We discussed the nature of the surgery including recovery and risks of bleeding or blood clots, anesthetic complications, bile leak or injury to surrounding organs. We discussed that once gallstones become symptomatic she is at some risk for continued symptoms or worsening problems such as acute cholecystitis or pancreatitis. After our discussion she feels that she would like to proceed  with laparoscopic cholecystectomy in order to avoid further symptoms or worsening complications of her gallstones. I think this is likely the safest course. She would like to postpone the surgery until January and as long as she is asymptomatic I think this is reasonable. She will call for any recurrent symptoms.    Plan:     We will obtain cardiac clearance from Dr. Elease Hashimoto Recommendations of management of her anticoagulation from Dr. Elease Hashimoto as well After this we'll schedule for laparoscopic cholecystectomy with cholangiogram with overnight hospitalization.

## 2013-01-09 NOTE — Telephone Encounter (Signed)
Cardiac clearance faxed to Dr. Elease Hashimoto @ [phone].

## 2013-01-09 NOTE — Patient Instructions (Signed)
We will contact her cardiologist for cardiac clearance and recommendations regarding anti-coagulation and then call you for scheduling  Cholelithiasis Cholelithiasis (also called gallstones) is a form of gallbladder disease where gallstones form in your gallbladder. The gallbladder is a non-essential organ that stores bile made in the liver, which helps digest fats. Gallstones begin as small crystals and slowly grow into stones. Gallstone pain occurs when the gallbladder spasms, and a gallstone is blocking the duct. Pain can also occur when a stone passes out of the duct.  Women are more likely to develop gallstones than men. Other factors that increase the risk of gallbladder disease are:  Having multiple pregnancies. Physicians sometimes advise removing diseased gallbladders before future pregnancies.  Obesity.  Diets heavy in fried foods and fat.  Increasing age (older than 1).  Prolonged use of medications containing female hormones.  Diabetes mellitus.  Rapid weight loss.  Family history of gallstones (heredity). SYMPTOMS  Feeling sick to your stomach (nauseous).  Abdominal pain.  Yellowing of the skin (jaundice).  Sudden pain. It may persist from several minutes to several hours.  Worsening pain with deep breathing or when jarred.  Fever.  Tenderness to the touch. In some cases, when gallstones do not move into the bile duct, people have no pain or symptoms. These are called "silent" gallstones. TREATMENT In severe cases, emergency surgery may be required. HOME CARE INSTRUCTIONS   Only take over-the-counter or prescription medicines for pain, discomfort, or fever as directed by your caregiver.  Follow a low-fat diet until seen again. Fat causes the gallbladder to contract, which can result in pain.  Follow up as instructed. Attacks are almost always recurrent and surgery is usually required for permanent treatment. SEEK IMMEDIATE MEDICAL CARE IF:   Your pain  increases and is not controlled by medications.  You have an oral temperature above 102 F (38.9 C), not controlled by medication.  You develop nausea and vomiting. MAKE SURE YOU:   Understand these instructions.  Will watch your condition.  Will get help right away if you are not doing well or get worse. Document Released: 02/23/2005 Document Revised: 05/22/2011 Document Reviewed: 04/28/2010 [facility] Patient Information 2014 [place], [place].

## 2013-01-14 ENCOUNTER — Telehealth: Payer: Self-pay | Admitting: Cardiovascular Disease

## 2013-01-14 NOTE — Telephone Encounter (Signed)
Received request from Nurse fax box, documents faxed for surgical clearance. To: [facility] Fax number: [phone] Attention: 01/14/13/KM

## 2013-01-15 ENCOUNTER — Telehealth: Payer: Self-pay | Admitting: Cardiovascular Disease

## 2013-01-15 ENCOUNTER — Telehealth (INDEPENDENT_AMBULATORY_CARE_PROVIDER_SITE_OTHER): Payer: Self-pay | Admitting: General Surgery

## 2013-01-15 NOTE — Telephone Encounter (Signed)
New message     Pt having problems with gallstones.  Surgeon (Dr Johna Sheriff) is waiting for clearance from doctor.  Clearance was faxed to our office days ago.  Pls fax clearance or call pt.

## 2013-01-15 NOTE — Telephone Encounter (Signed)
I left a message for Natalie Ho, Dr. [facility] assistant @ [facility], to call me to verify that she received clearance faxed by Cala Bradford in [facility] on 11/4.

## 2013-01-15 NOTE — Telephone Encounter (Signed)
Pt having symptoms PCP told her to get surgery.  No orders / no CC

## 2013-01-16 ENCOUNTER — Encounter (INDEPENDENT_AMBULATORY_CARE_PROVIDER_SITE_OTHER): Payer: Self-pay

## 2013-01-16 NOTE — Telephone Encounter (Signed)
Orders written and given to scheduling

## 2013-01-27 ENCOUNTER — Encounter: Payer: Self-pay | Admitting: Internal Medicine

## 2013-01-27 ENCOUNTER — Ambulatory Visit (INDEPENDENT_AMBULATORY_CARE_PROVIDER_SITE_OTHER): Payer: Medicare Other | Admitting: Internal Medicine

## 2013-01-27 VITALS — BP 110/62 | HR 76 | Ht 65.0 in | Wt 138.8 lb

## 2013-01-27 DIAGNOSIS — I97131 Postprocedural heart failure following other surgery: Secondary | ICD-10-CM

## 2013-01-27 DIAGNOSIS — I519 Heart disease, unspecified: Secondary | ICD-10-CM

## 2013-01-27 DIAGNOSIS — J45909 Unspecified asthma, uncomplicated: Secondary | ICD-10-CM

## 2013-01-27 DIAGNOSIS — J4530 Mild persistent asthma, uncomplicated: Secondary | ICD-10-CM

## 2013-01-27 DIAGNOSIS — I509 Heart failure, unspecified: Secondary | ICD-10-CM

## 2013-01-27 NOTE — Progress Notes (Signed)
08/24/10- Dr Duaine Dredge of Dr Lamonte Sakai for chronic cough and PNDS. Has also been evaluated for a RUL nodule in past which is now deemed to be benign. Seen as add-on 6/13 with persistent to worsening cough, particularly in the past couple of weeks. Had 2 episodes of scant hemoptysis on 6/12. No increase in SOB. Denies CP or pleurodynia. Denies F/C/S, LE edema or calf tenderness. On warfarin for CAF. PT last checked approx 3 wks ago and has been in therapeutic range consistently. Denies overt epistaxis but thinks blood might have come from nose.  04/14/11- 30 yoF former smoker  Newly referred to [facility] by  [facility] with complaint of trouble breathing when exercising. Husband is here. Over the last year she's been more aware of dyspnea with exertion. She is comfortable at rest. The sensation has been constant without any original event recognized, variation from day-to-day or progression. This is noticed mainly if she is walking up an incline, taking stairs quickly without stopping or otherwise exercising a little harder than usual ADLs. She was seen here in June as noted above will followup of her chronic cough problems which had been present for years. Cough has been mostly dry with occasional wheeze. Heavy dust exposure may be a trigger. Atrial fibrillation diagnosed about 2007, no pacemaker/Dr. Acie Fredrickson. Denies anemia.  Denies history of pneumonia or TB exposure. PPD negative in the past. She worked in the past/[place] with possibility of histoplasma exposure but no rebound liver is. No diagnosis of sarcoid. Soc- Married housewife, quit cigs 1992. CXR- 04/14/11- images reviewed: IMPRESSION:  No acute cardiopulmonary process. Central vascular congestion  without overt edema.  Evidence of prior granulomatous disease.  Original Report Authenticated By: Arline Asp, M.D.   05/12/11- 20 yoF former smoker followed for dyspnea on exertion, complicated by hx lung nodule, GERD, AFib,  HBP, TIA. Little cough or phlegm. Still gets short of breath with exertion. Tolerated eye surgery. PFT: 04/20/2011-mild obstructive airways disease with insignificant response to bronchodilator, air trapping, diffusion mildly reduced. FEV1/FVC 0.63, DLCO 76%. 6 minute walk test-97%, 96%, 98%, 513 m. Well-maintained oxygenation.  06/26/11- 54 yoF former smoker followed for dyspnea on exertion, complicated by hx lung nodule, GERD, AFib, HBP, TIA. Acute visit-cough-productive at times-yellow; coughed up blood on Friday-not a lot. Husband here. Tudorza sample made her cough worse.  We called in an antibiotic which is helping acute bronchitis. Producing less phlegm. Codeine cough syrup is some help.  08/10/11- 89 yoF former smoker followed for dyspnea on exertion, complicated by hx lung nodule, GERD, AFib, HBP, TIA.    Dr Melford Aase PCP    Husband here Reports doing much better. Used up sample [facility]- says no longer needed. Benzonatate caused hallucination. We discussed CXR as reviewed w/ her- suggested vascular congestion, so improvement may have started with that. She no longer feels palpitation and denies orthopnea or swelling.   CXR 04/28/11 IMPRESSION:  No acute cardiopulmonary process. Central vascular congestion  without overt edema.  Evidence of prior granulomatous disease.  Original Report Authenticated By: Arline Asp, M.D.    02/05/12- 5 yoF former smoker followed for dyspnea on exertion, asthma/ bronchitis, complicated by hx lung nodule, GERD, AFib, HBP, TIA.    Dr Melford Aase PCP    Husband here Follows For:SOB about the same - Occas non prod cough - Occas wheezing - Denies chest tightness Cough most days is nonproductive. Some dyspnea on exertion, not worse. Plans hip replacement in January and the arthritis limits  her activity. Denies chest pain, palpitation, purulent or bloody sputum.  04/19/12 Acute OV  Complains of pt c/o increased SOB with activity and wheezing. Pt states these  symptoms are worse in the mornings and bedtime. . Symptoms have been worse x 10 days.  Pt also c/o increased swelling in feet and ankles.  Complains of DOE. Bilateral leg swelling . Weight is up 15lbs.  Denies chest pain , orthopnea, fever , discolored mucus or fever. No calf pain  Had elective left hip replacement 04/03/12 . Has had good recovery w/ rehab at home.  Has hx of Atrial Fib on chronic coumadin .  INR therapeutic last week at 2.2 .  CXR shows bilateral small pleural effusions.   01/27/13- 48 yoF former smoker followed for dyspnea on exertion, asthma/ bronchitis, complicated by hx lung nodule, GERD, AFib, HBP, TIA. FOLLOWS FOR:  Breathing unchanged since last OV. No concerns today Feet no longer swell CT chest 04/20/12 IMPRESSION:  1. No evidence of acute pulmonary embolism.  2. Small pleural effusions, cardiomegaly, and slightly prominent  interstitial markings may indicate very mild interstitial edema.  3. Calcified mediastinal and hilar nodes with calcified right  upper lobe granuloma consistent with prior granulomatous disease.  Original Report Authenticated By: Dwyane Dee, M.D  ROS-see HPI Constitutional:   No-   weight loss, night sweats, fevers, chills, fatigue, lassitude. HEENT:   No-  headaches, difficulty swallowing, tooth/dental problems, sore throat,       No-  sneezing, itching, ear ache, nasal congestion, post nasal drip,  CV:  No-   chest pain, orthopnea, PND, swelling in lower extremities, anasarca,  dizziness, palpitations Resp: No-   shortness of breath with exertion or at rest.              No-   productive cough,  No non-productive cough,  No- coughing up of blood.              No-   change in color of mucus.  No- wheezing.   Skin: No-   rash or lesions. GI:  No-   heartburn, indigestion, abdominal pain, nausea, vomiting, GU: . MS:  No-   joint pain or swelling.   Neuro-     nothing unusual Psych:  No- change in mood or affect. No depression or anxiety.   No memory loss.  OBJ- Physical Exam General- Alert, Oriented, Affect-appropriate, Distress- none acute Skin- rash-none, lesions- none, excoriation- none Lymphadenopathy- none Head- atraumatic            Eyes- Gross vision intact, PERRLA, conjunctivae and secretions clear            Ears- Hearing, canals-normal            Nose- Clear, no-Septal dev, mucus, polyps, erosion, perforation             Throat- Mallampati II , mucosa clear , drainage- none, tonsils- atrophic Neck- flexible , trachea midline, no stridor , thyroid nl, carotid no bruit Chest - symmetrical excursion , unlabored           Heart/CV- RRR , no murmur , no gallop  , no rub, nl s1 s2                           - JVD- none , edema- none, stasis changes- none, varices- none           Lung- clear to P&A, wheeze- none, cough- none ,  dullness-none, rub- none           Chest wall-  Abd-  Br/ Gen/ Rectal- Not done, not indicated Extrem- cyanosis- none, clubbing, none, atrophy- none, strength- nl Neuro- grossly intact to observation

## 2013-01-27 NOTE — Patient Instructions (Signed)
Please call as needed 

## 2013-01-28 ENCOUNTER — Ambulatory Visit (INDEPENDENT_AMBULATORY_CARE_PROVIDER_SITE_OTHER): Payer: Medicare Other | Admitting: General Practice

## 2013-01-28 DIAGNOSIS — I4891 Unspecified atrial fibrillation: Secondary | ICD-10-CM

## 2013-01-28 LAB — POCT INR: INR: 1.9

## 2013-02-10 ENCOUNTER — Other Ambulatory Visit: Payer: Self-pay | Admitting: *Deleted

## 2013-02-10 MED ORDER — WARFARIN SODIUM 5 MG PO TABS: ORAL_TABLET | ORAL | Status: DC

## 2013-02-11 ENCOUNTER — Other Ambulatory Visit: Payer: Self-pay | Admitting: *Deleted

## 2013-02-11 ENCOUNTER — Telehealth: Payer: Self-pay | Admitting: Cardiovascular Disease

## 2013-02-11 MED ORDER — DIGOXIN 125 MCG PO TABS: 125.0000 ug | ORAL_TABLET | Freq: Every morning | ORAL | Status: DC

## 2013-02-11 MED ORDER — AMLODIPINE BESYLATE 2.5 MG PO TABS: 2.5000 mg | ORAL_TABLET | Freq: Every evening | ORAL | Status: DC

## 2013-02-11 NOTE — Telephone Encounter (Signed)
The patient accompanied her husband to the office today. She is having gallbladder surgery on December 17. She has 4 Lovenox injections left over from her last surgical procedure.  I recommend that she discontinue her Coumadin on December 12. She will start Lovenox 60 mg subcutaneously every 12 hours starting on December 15 and continuing until the morning dose of December 16. She'll hold her evening dose on December 16 and the morning of December 17. She'll need to go to the surgical center on December 17.  She should restart her Coumadin when she is willing to take by mouth-hopefully around December 18. I do not think that we should use Lovenox following her surgery to minimize the risk of bleeding.  She should  have her INR checked approximately one week after she restarted Coumadin.  Vesta Mixer, Montez Hageman., MD, [facility] 02/11/2013, 1:21 PM Office - [phone] Pager [phone]

## 2013-02-11 NOTE — Telephone Encounter (Signed)
Pt was here with husband, refilled meds.

## 2013-02-13 NOTE — Assessment & Plan Note (Signed)
Dyspnea probably mostly related to fluid retention after hip replacement. Lung function seems stable

## 2013-02-13 NOTE — Assessment & Plan Note (Signed)
improved

## 2013-02-14 ENCOUNTER — Other Ambulatory Visit (INDEPENDENT_AMBULATORY_CARE_PROVIDER_SITE_OTHER): Payer: Self-pay | Admitting: General Surgery

## 2013-02-14 NOTE — Progress Notes (Signed)
Surgery scheduled for 02/26/13.  Preop on 02/24/13 at 200pm.  Need orders in EPIC.  Thank You.

## 2013-02-20 ENCOUNTER — Encounter (HOSPITAL_COMMUNITY): Payer: Self-pay | Admitting: Pharmacy Technician

## 2013-02-24 ENCOUNTER — Encounter (HOSPITAL_COMMUNITY): Payer: Self-pay

## 2013-02-24 ENCOUNTER — Encounter (HOSPITAL_COMMUNITY)
Admission: RE | Admit: 2013-02-24 | Discharge: 2013-02-24 | Disposition: A | Discharge status: Home / Self Care | Payer: Medicare Other | Source: Ambulatory Visit | Attending: General Surgery | Admitting: General Surgery

## 2013-02-24 ENCOUNTER — Ambulatory Visit (HOSPITAL_COMMUNITY)
Admission: RE | Admit: 2013-02-24 | Discharge: 2013-02-24 | Disposition: A | Discharge status: Home / Self Care | Payer: Medicare Other | Source: Ambulatory Visit | Attending: General Surgery | Admitting: General Surgery

## 2013-02-24 ENCOUNTER — Other Ambulatory Visit (HOSPITAL_COMMUNITY): Payer: Self-pay | Admitting: General Surgery

## 2013-02-24 DIAGNOSIS — Z01818 Encounter for other preprocedural examination: Secondary | ICD-10-CM | POA: Insufficient documentation

## 2013-02-24 DIAGNOSIS — R0602 Shortness of breath: Secondary | ICD-10-CM | POA: Insufficient documentation

## 2013-02-24 DIAGNOSIS — Z01812 Encounter for preprocedural laboratory examination: Secondary | ICD-10-CM | POA: Insufficient documentation

## 2013-02-24 DIAGNOSIS — K8020 Calculus of gallbladder without cholecystitis without obstruction: Secondary | ICD-10-CM | POA: Insufficient documentation

## 2013-02-24 HISTORY — DX: Unspecified glaucoma: H40.9

## 2013-02-24 HISTORY — DX: Tinnitus, bilateral: H93.13

## 2013-02-24 LAB — CBC
MCH: 28.7 pg (ref 26.0–34.0)
MCHC: 32.8 g/dL (ref 30.0–36.0)
MCV: 87.6 fL (ref 78.0–100.0)
Platelets: 228 10*3/uL (ref 150–400)
RDW: 14.7 % (ref 11.5–15.5)
WBC: 4.4 10*3/uL (ref 4.0–10.5)

## 2013-02-24 LAB — APTT: aPTT: 39 seconds — ABNORMAL HIGH (ref 24–37)

## 2013-02-24 LAB — PROTIME-INR: Prothrombin Time: 18.3 seconds — ABNORMAL HIGH (ref 11.6–15.2)

## 2013-02-24 LAB — BASIC METABOLIC PANEL
BUN: 13 mg/dL (ref 6–23)
CO2: 30 mEq/L (ref 19–32)
Calcium: 9.5 mg/dL (ref 8.4–10.5)
Creatinine, Ser: 0.74 mg/dL (ref 0.50–1.10)

## 2013-02-24 NOTE — Progress Notes (Signed)
04-20-12 chest ct abnormal epic, chest 2 view xray 04-19-12 abnormal epic, will repeat chest xray with pre op 02-24-13 11-16-12 ekg epic 01-27-13 lov dr wert pulmonary epic, 07-11-12 lov dr Melburn Popper cardiology epic 04-20-12 echo epic 01-13-13 cardiac clearance note for 02-26-13  surgery dr Melburn Popper in epic under cv procedures

## 2013-02-24 NOTE — Patient Instructions (Addendum)
20 Natalie Ho  02/24/2013   Your procedure is scheduled on: Wednesday December 17th  Report to [facility] at 730  AM.  Call this number if you have problems the morning of surgery [phone]   Remember:BRING ALL YOUR NEW EYE DROP BOTTLES DAY OF SURGERY AFTER YOU GET THEM FROM PHARMACY.   Do not eat food or drink liquids :After Midnight.     Take these medicines the morning of surgery with A SIP OF WATER: digoxin, bystolic, prilosec, zoloft, eye drops                                [facility] PREPARING FOR SURGERY SHEET             You may not have any metal on your body including hair pins and piercings  Do not wear jewelry, make-up.  Do not wear lotions, powders, or perfumes. You may wear deodorant.   Men may shave face and neck.  Do not bring valuables to the hospital. [facility] IS NOT RESPONSIBLE FOR VALUEABLES.  Contacts, dentures or bridgework may not be worn into surgery.  Leave suitcase in the car. After surgery it may be brought to your room.  For patients admitted to the hospital, checkout time is 11:00 AM the day of discharge.   Patients discharged the day of surgery will not be allowed to drive home.  Name and phone number of your driver:  Special Instructions: N/A   Please read over the following fact sheets that you were given:   Call Cain Sieve RN pre op nurse if needed [phone]    FAILURE TO FOLLOW THESE INSTRUCTIONS MAY RESULT IN THE CANCELLATION OF YOUR SURGERY.  PATIENT SIGNATURE___________________________________________  NURSE SIGNATURE_____________________________________________

## 2013-02-24 NOTE — Progress Notes (Signed)
PATIENT STATED SHE DID NOT KNOW TO TAKE LOVENOX INJECTIONS, PATIENT TOLD TO CALL DR Melburn Popper AND FOLLOW UP ON TAKING LIVENOX INJECTIONS FROM DR Melburn Popper.

## 2013-02-26 ENCOUNTER — Observation Stay (HOSPITAL_COMMUNITY)
Admission: RE | Admit: 2013-02-26 | Discharge: 2013-02-27 | Disposition: A | Discharge status: Home / Self Care | Payer: Medicare Other | Source: Ambulatory Visit | Attending: General Surgery | Admitting: General Surgery

## 2013-02-26 ENCOUNTER — Ambulatory Visit (HOSPITAL_COMMUNITY): Payer: Medicare Other

## 2013-02-26 ENCOUNTER — Encounter (HOSPITAL_COMMUNITY)
Admission: RE | Disposition: A | Discharge status: Home / Self Care | Payer: Self-pay | Source: Ambulatory Visit | Attending: General Surgery

## 2013-02-26 ENCOUNTER — Encounter (HOSPITAL_COMMUNITY): Payer: Self-pay | Admitting: *Deleted

## 2013-02-26 ENCOUNTER — Encounter (HOSPITAL_COMMUNITY): Payer: Medicare Other | Admitting: Anesthesiology

## 2013-02-26 ENCOUNTER — Ambulatory Visit (HOSPITAL_COMMUNITY): Payer: Medicare Other | Admitting: Anesthesiology

## 2013-02-26 DIAGNOSIS — K219 Gastro-esophageal reflux disease without esophagitis: Secondary | ICD-10-CM | POA: Insufficient documentation

## 2013-02-26 DIAGNOSIS — I10 Essential (primary) hypertension: Secondary | ICD-10-CM | POA: Insufficient documentation

## 2013-02-26 DIAGNOSIS — Z8673 Personal history of transient ischemic attack (TIA), and cerebral infarction without residual deficits: Secondary | ICD-10-CM | POA: Insufficient documentation

## 2013-02-26 DIAGNOSIS — E785 Hyperlipidemia, unspecified: Secondary | ICD-10-CM | POA: Insufficient documentation

## 2013-02-26 DIAGNOSIS — Z7901 Long term (current) use of anticoagulants: Secondary | ICD-10-CM | POA: Insufficient documentation

## 2013-02-26 DIAGNOSIS — Z96649 Presence of unspecified artificial hip joint: Secondary | ICD-10-CM | POA: Insufficient documentation

## 2013-02-26 DIAGNOSIS — M109 Gout, unspecified: Secondary | ICD-10-CM | POA: Insufficient documentation

## 2013-02-26 DIAGNOSIS — Z79899 Other long term (current) drug therapy: Secondary | ICD-10-CM | POA: Insufficient documentation

## 2013-02-26 DIAGNOSIS — K8010 Calculus of gallbladder with chronic cholecystitis without obstruction: Principal | ICD-10-CM | POA: Insufficient documentation

## 2013-02-26 DIAGNOSIS — Z7982 Long term (current) use of aspirin: Secondary | ICD-10-CM | POA: Insufficient documentation

## 2013-02-26 DIAGNOSIS — I4891 Unspecified atrial fibrillation: Secondary | ICD-10-CM | POA: Insufficient documentation

## 2013-02-26 HISTORY — PX: CHOLECYSTECTOMY: SHX55

## 2013-02-26 LAB — PROTIME-INR: Prothrombin Time: 15.6 seconds — ABNORMAL HIGH (ref 11.6–15.2)

## 2013-02-26 SURGERY — LAPAROSCOPIC CHOLECYSTECTOMY WITH INTRAOPERATIVE CHOLANGIOGRAM
Anesthesia: General | Site: Abdomen

## 2013-02-26 MED ORDER — BUPIVACAINE-EPINEPHRINE 0.25% -1:200000 IJ SOLN
INTRAMUSCULAR | Status: AC
  Filled 2013-02-26: qty 1

## 2013-02-26 MED ORDER — ONDANSETRON HCL 4 MG/2ML IJ SOLN
INTRAMUSCULAR | Status: AC
  Filled 2013-02-26: qty 2

## 2013-02-26 MED ORDER — ROCURONIUM BROMIDE 100 MG/10ML IV SOLN
INTRAVENOUS | Status: DC | PRN
  Administered 2013-02-26: 35 mg via INTRAVENOUS

## 2013-02-26 MED ORDER — PROPOFOL 10 MG/ML IV BOLUS
INTRAVENOUS | Status: AC
  Filled 2013-02-26: qty 20

## 2013-02-26 MED ORDER — MEPERIDINE HCL 50 MG/ML IJ SOLN: 6.2500 mg | INTRAMUSCULAR | Status: DC | PRN

## 2013-02-26 MED ORDER — KCL IN DEXTROSE-NACL 20-5-0.45 MEQ/L-%-% IV SOLN
INTRAVENOUS | Status: DC
  Administered 2013-02-26: 14:00:00 via INTRAVENOUS
  Filled 2013-02-26 (×2): qty 1000

## 2013-02-26 MED ORDER — SODIUM CHLORIDE 0.9 % IR SOLN
Status: DC | PRN
  Administered 2013-02-26: 1000 mL

## 2013-02-26 MED ORDER — CEFAZOLIN SODIUM-DEXTROSE 2-3 GM-% IV SOLR
INTRAVENOUS | Status: AC
  Filled 2013-02-26: qty 50

## 2013-02-26 MED ORDER — NEOSTIGMINE METHYLSULFATE 1 MG/ML IJ SOLN
INTRAMUSCULAR | Status: DC | PRN
  Administered 2013-02-26: 2 mg via INTRAVENOUS

## 2013-02-26 MED ORDER — MORPHINE SULFATE 2 MG/ML IJ SOLN: 1.0000 mg | INTRAMUSCULAR | Status: DC | PRN

## 2013-02-26 MED ORDER — FENTANYL CITRATE 0.05 MG/ML IJ SOLN
INTRAMUSCULAR | Status: AC
  Filled 2013-02-26: qty 5

## 2013-02-26 MED ORDER — NEOSTIGMINE METHYLSULFATE 1 MG/ML IJ SOLN
INTRAMUSCULAR | Status: AC
  Filled 2013-02-26: qty 10

## 2013-02-26 MED ORDER — LIDOCAINE HCL (CARDIAC) 20 MG/ML IV SOLN
INTRAVENOUS | Status: AC
  Filled 2013-02-26: qty 5

## 2013-02-26 MED ORDER — FENTANYL CITRATE 0.05 MG/ML IJ SOLN
INTRAMUSCULAR | Status: DC | PRN
  Administered 2013-02-26: 50 ug via INTRAVENOUS

## 2013-02-26 MED ORDER — GLYCOPYRROLATE 0.2 MG/ML IJ SOLN
INTRAMUSCULAR | Status: AC
  Filled 2013-02-26: qty 2

## 2013-02-26 MED ORDER — CEFAZOLIN SODIUM-DEXTROSE 2-3 GM-% IV SOLR
2.0000 g | INTRAVENOUS | Status: AC
  Administered 2013-02-26: 2 g via INTRAVENOUS

## 2013-02-26 MED ORDER — AMLODIPINE BESYLATE 2.5 MG PO TABS
2.5000 mg | ORAL_TABLET | Freq: Every evening | ORAL | Status: DC
  Administered 2013-02-26: 2.5 mg via ORAL
  Filled 2013-02-26 (×2): qty 1

## 2013-02-26 MED ORDER — LACTATED RINGERS IV SOLN
INTRAVENOUS | Status: DC | PRN
  Administered 2013-02-26: 1000 mL

## 2013-02-26 MED ORDER — IOHEXOL 300 MG/ML  SOLN
INTRAMUSCULAR | Status: DC | PRN
  Administered 2013-02-26: 15 mL via INTRAVENOUS

## 2013-02-26 MED ORDER — KCL IN DEXTROSE-NACL 20-5-0.45 MEQ/L-%-% IV SOLN
INTRAVENOUS | Status: AC
  Filled 2013-02-26: qty 1000

## 2013-02-26 MED ORDER — GLYCOPYRROLATE 0.2 MG/ML IJ SOLN
INTRAMUSCULAR | Status: DC | PRN
  Administered 2013-02-26: 0.3 mg via INTRAVENOUS

## 2013-02-26 MED ORDER — HYDROCODONE-ACETAMINOPHEN 5-325 MG PO TABS
1.0000 | ORAL_TABLET | ORAL | Status: DC | PRN
  Administered 2013-02-26 – 2013-02-27 (×2): 1 via ORAL
  Filled 2013-02-26 (×2): qty 1

## 2013-02-26 MED ORDER — LACTATED RINGERS IV SOLN
INTRAVENOUS | Status: DC | PRN
  Administered 2013-02-26: 09:00:00 via INTRAVENOUS

## 2013-02-26 MED ORDER — SERTRALINE HCL 50 MG PO TABS
50.0000 mg | ORAL_TABLET | Freq: Every morning | ORAL | Status: DC
  Administered 2013-02-27: 50 mg via ORAL
  Filled 2013-02-26: qty 1

## 2013-02-26 MED ORDER — PROMETHAZINE HCL 25 MG/ML IJ SOLN: 6.2500 mg | INTRAMUSCULAR | Status: DC | PRN

## 2013-02-26 MED ORDER — BUPIVACAINE-EPINEPHRINE PF 0.25-1:200000 % IJ SOLN
INTRAMUSCULAR | Status: AC
  Filled 2013-02-26: qty 30

## 2013-02-26 MED ORDER — HEPARIN SODIUM (PORCINE) 5000 UNIT/ML IJ SOLN
5000.0000 [IU] | Freq: Three times a day (TID) | INTRAMUSCULAR | Status: DC
  Administered 2013-02-26 – 2013-02-27 (×2): 5000 [IU] via SUBCUTANEOUS
  Filled 2013-02-26 (×5): qty 1

## 2013-02-26 MED ORDER — LIDOCAINE HCL (CARDIAC) 20 MG/ML IV SOLN
INTRAVENOUS | Status: DC | PRN
  Administered 2013-02-26: 40 mg via INTRAVENOUS

## 2013-02-26 MED ORDER — PROPOFOL 10 MG/ML IV BOLUS
INTRAVENOUS | Status: DC | PRN
  Administered 2013-02-26: 50 mg via INTRAVENOUS

## 2013-02-26 MED ORDER — DIGOXIN 125 MCG PO TABS
125.0000 ug | ORAL_TABLET | Freq: Every morning | ORAL | Status: DC
Start: 2013-02-27 — End: 2013-02-27
  Administered 2013-02-27: 125 ug via ORAL
  Filled 2013-02-26: qty 1

## 2013-02-26 MED ORDER — BUPIVACAINE-EPINEPHRINE 0.25% -1:200000 IJ SOLN
INTRAMUSCULAR | Status: DC | PRN
  Administered 2013-02-26: 10 mL

## 2013-02-26 MED ORDER — ASPIRIN EC 81 MG PO TBEC
81.0000 mg | DELAYED_RELEASE_TABLET | Freq: Every day | ORAL | Status: DC
  Administered 2013-02-26 – 2013-02-27 (×2): 81 mg via ORAL
  Filled 2013-02-26 (×2): qty 1

## 2013-02-26 MED ORDER — HYDROMORPHONE HCL PF 1 MG/ML IJ SOLN: 0.2500 mg | INTRAMUSCULAR | Status: DC | PRN

## 2013-02-26 MED ORDER — ETOMIDATE 2 MG/ML IV SOLN
INTRAVENOUS | Status: DC | PRN
  Administered 2013-02-26: 8 mg via INTRAVENOUS

## 2013-02-26 MED ORDER — ONDANSETRON HCL 4 MG/2ML IJ SOLN
INTRAMUSCULAR | Status: DC | PRN
  Administered 2013-02-26: 4 mg via INTRAVENOUS

## 2013-02-26 MED ORDER — NEBIVOLOL HCL 5 MG PO TABS
5.0000 mg | ORAL_TABLET | Freq: Every morning | ORAL | Status: DC
  Administered 2013-02-27: 5 mg via ORAL
  Filled 2013-02-26: qty 1

## 2013-02-26 MED ORDER — ATORVASTATIN CALCIUM 20 MG PO TABS
20.0000 mg | ORAL_TABLET | Freq: Every day | ORAL | Status: DC
  Filled 2013-02-26 (×2): qty 1

## 2013-02-26 MED ORDER — ROCURONIUM BROMIDE 100 MG/10ML IV SOLN
INTRAVENOUS | Status: AC
  Filled 2013-02-26: qty 1

## 2013-02-26 MED ORDER — ONDANSETRON HCL 4 MG/2ML IJ SOLN: 4.0000 mg | Freq: Four times a day (QID) | INTRAMUSCULAR | Status: DC | PRN

## 2013-02-26 MED ORDER — ONDANSETRON HCL 4 MG PO TABS: 4.0000 mg | ORAL_TABLET | Freq: Four times a day (QID) | ORAL | Status: DC | PRN

## 2013-02-26 SURGICAL SUPPLY — 39 items
ADH SKN CLS APL DERMABOND .7 (GAUZE/BANDAGES/DRESSINGS) ×1
APPLIER CLIP ROT 10 11.4 M/L (STAPLE) ×2
APR CLP MED LRG 11.4X10 (STAPLE) ×1
BAG SPEC RTRVL LRG 6X4 10 (ENDOMECHANICALS) ×1
CANISTER SUCTION 2500CC (MISCELLANEOUS) ×1 IMPLANT
CATH REDDICK CHOLANGI 4FR 50CM (CATHETERS) IMPLANT
CHLORAPREP W/TINT 26ML (MISCELLANEOUS) ×2 IMPLANT
CLIP APPLIE ROT 10 11.4 M/L (STAPLE) ×1 IMPLANT
COVER MAYO STAND STRL (DRAPES) ×2 IMPLANT
DECANTER SPIKE VIAL GLASS SM (MISCELLANEOUS) ×1 IMPLANT
DERMABOND ADVANCED (GAUZE/BANDAGES/DRESSINGS) ×1
DERMABOND ADVANCED .7 DNX12 (GAUZE/BANDAGES/DRESSINGS) ×1 IMPLANT
DRAPE C-ARM 42X120 X-RAY (DRAPES) ×2 IMPLANT
DRAPE LAPAROSCOPIC ABDOMINAL (DRAPES) ×2 IMPLANT
DRAPE UTILITY XL STRL (DRAPES) ×2 IMPLANT
ELECT REM PT RETURN 9FT ADLT (ELECTROSURGICAL) ×2
ELECTRODE REM PT RTRN 9FT ADLT (ELECTROSURGICAL) ×1 IMPLANT
GLOVE BIOGEL PI IND STRL 7.5 (GLOVE) ×1 IMPLANT
GLOVE BIOGEL PI INDICATOR 7.5 (GLOVE) ×1
GLOVE SS BIOGEL STRL SZ 7.5 (GLOVE) ×1 IMPLANT
GLOVE SUPERSENSE BIOGEL SZ 7.5 (GLOVE) ×1
GOWN STRL REIN XL XLG (GOWN DISPOSABLE) ×5 IMPLANT
HEMOSTAT SNOW SURGICEL 2X4 (HEMOSTASIS) IMPLANT
KIT BASIN OR (CUSTOM PROCEDURE TRAY) ×2 IMPLANT
NS IRRIG 1000ML POUR BTL (IV SOLUTION) ×1 IMPLANT
POUCH SPECIMEN RETRIEVAL 10MM (ENDOMECHANICALS) ×1 IMPLANT
SCISSORS LAP 5X35 DISP (ENDOMECHANICALS) ×2 IMPLANT
SET CHOLANGIOGRAPH MIX (MISCELLANEOUS) ×2 IMPLANT
SET IRRIG TUBING LAPAROSCOPIC (IRRIGATION / IRRIGATOR) ×2 IMPLANT
SLEEVE XCEL OPT CAN 5 100 (ENDOMECHANICALS) ×2 IMPLANT
SOLUTION ANTI FOG 6CC (MISCELLANEOUS) ×2 IMPLANT
SUT MNCRL AB 4-0 PS2 18 (SUTURE) ×2 IMPLANT
TOWEL OR 17X26 10 PK STRL BLUE (TOWEL DISPOSABLE) ×2 IMPLANT
TOWEL OR NON WOVEN STRL DISP B (DISPOSABLE) ×2 IMPLANT
TRAY LAP CHOLE (CUSTOM PROCEDURE TRAY) ×2 IMPLANT
TROCAR BLADELESS OPT 5 100 (ENDOMECHANICALS) ×2 IMPLANT
TROCAR XCEL BLUNT TIP 100MML (ENDOMECHANICALS) ×2 IMPLANT
TROCAR XCEL NON-BLD 11X100MML (ENDOMECHANICALS) ×2 IMPLANT
TUBING INSUFFLATION 10FT LAP (TUBING) ×2 IMPLANT

## 2013-02-26 NOTE — Preoperative (Signed)
Beta Blockers   Reason not to administer Beta Blockers:Not Applicable 

## 2013-02-26 NOTE — Op Note (Signed)
Preoperative diagnosis: Cholelithiasis and cholecystitis  Postoperative diagnosis: Cholelithiasis and cholecystitis  Surgical procedure: Laparoscopic cholecystectomy with intraoperative cholangiogram  Surgeon: Sharlet Salina T. Edmund Holcomb M.D.  Assistant: None  Anesthesia: General Endotracheal  Complications: None  Estimated blood loss: Minimal  Description of procedure: The patient brought to the operating room, placed in the supine position on the operating table, and general endotracheal anesthesia induced. The abdomen was widely sterilely prepped and draped. The patient had received preoperative IV antibiotics and PAS were in place. Patient timeout was performed the correct procedure verified. Standard 4 port technique was used with an open Hassan cannula at the umbilicus and the remainder of the ports placed under direct vision. The gallbladder was visualized. It appeared chronically inflamed with extensive omental adhesions.  Omental adhesions were taken down with sharp and blunt dissectionThe fundus was grasped and elevated up over the liver and the infundibulum retracted inferiolaterally. Peritoneum anterior and posterior to close triangle was incised and fibrofatty tissue stripped off the neck of the gallbladder toward the porta hepatis. The distal gallbladder was thoroughly dissected. The cystic artery was identified in close triangle and the cystic duct gallbladder junction dissected 360.  A good critical view was obtained. When the anatomy was clear the cystic duct was clipped at the gallbladder junction and an operative cholangiogram obtained through the cystic duct. This showed good filling of an enlarged common bile duct and intrahepatic ducts but free flow into the duodenum and no filling defects or strictures. Following this the Cholangiocath was removed and the cystic duct was doubly clipped proximally and divided. The cystic artery was doubly clipped proximally and distally and divided.  The gallbladder was dissected free from its bed using hook cautery and removed through the umbilical port site. Complete hemostasis was obtained in the gallbladder bed. The right upper quadrant was thoroughly irrigated and hemostasis assured. Trochars were removed and all CO2 evacuated and the [facility] trocar site fascial defect closed. Skin incisions were closed with subcuticular Monocryl and Dermabond. Sponge needle and instrument counts were correct. The patient was taken to PACU in good condition.  Evlyn Amason T  02/26/2013

## 2013-02-26 NOTE — Anesthesia Preprocedure Evaluation (Addendum)
Anesthesia Evaluation  Patient identified by MRN, date of birth, ID band Patient awake    Reviewed: Allergy & Precautions, H&P , NPO status , Patient's Chart, lab work & pertinent test results  Airway Mallampati: II TM Distance: >3 FB Neck ROM: Limited    Dental  (+) Dental Advisory Given and Teeth Intact   Pulmonary shortness of breath and with exertion, asthma , former smoker,  breath sounds clear to auscultation  Pulmonary exam normal       Cardiovascular hypertension, Pt. on medications + dysrhythmias Atrial Fibrillation + Valvular Problems/Murmurs AI Rhythm:Irregular Rate:Normal     Neuro/Psych  Headaches, Anxiety TIA   GI/Hepatic Neg liver ROS, GERD-  Medicated,  Endo/Other  negative endocrine ROS  Renal/GU negative Renal ROS     Musculoskeletal  (+) Arthritis -, Osteoarthritis,    Abdominal   Peds  Hematology  (+) Blood dyscrasia, anemia ,   Anesthesia Other Findings   Reproductive/Obstetrics                          Anesthesia Physical  Anesthesia Plan  ASA: III  Anesthesia Plan: General   Post-op Pain Management:    Induction: Intravenous  Airway Management Planned: Oral ETT  Additional Equipment:   Intra-op Plan:   Post-operative Plan: Extubation in OR  Informed Consent: I have reviewed the patients History and Physical, chart, labs and discussed the procedure including the risks, benefits and alternatives for the proposed anesthesia with the patient or authorized representative who has indicated his/her understanding and acceptance.   Dental advisory given  Plan Discussed with: CRNA  Anesthesia Plan Comments:        Anesthesia Quick Evaluation

## 2013-02-26 NOTE — Progress Notes (Signed)
Patient was instructed by Dr. Melburn Popper to be bridged with Lovonox but she forgot to take it. Dr. Johna Sheriff notified

## 2013-02-26 NOTE — Progress Notes (Signed)
Patient resting comfortably in bed. Report received from [employer] fundraiser.  Will continue to monitor.

## 2013-02-26 NOTE — Anesthesia Postprocedure Evaluation (Signed)
Anesthesia Post Note  Patient: Natalie Ho  Procedure(s) Performed: Procedure(s) (LRB): LAPAROSCOPIC CHOLECYSTECTOMY WITH INTRAOPERATIVE CHOLANGIOGRAM (N/A)  Anesthesia type: General  Patient location: PACU  Post pain: Pain level controlled  Post assessment: Post-op Vital signs reviewed  Last Vitals: BP 160/80  Pulse 50  Temp(Src) 36.7 C (Oral)  Resp 16  SpO2 100%  Post vital signs: Reviewed  Level of consciousness: sedated  Complications: No apparent anesthesia complications

## 2013-02-26 NOTE — H&P (Signed)
HPI  Patient is a very pleasant 79 year old female referred by Dr. Cyndia Bent 4 cholelithiasis and a recent episode of abdominal pain. The patient is accompanied by her husband. They state that about 4-6 weeks ago she developed the onset the onset of somewhat vague but significant mid abdominal pain. This waxed and waned but was daily and rated about 4/10. For some slight nausea without vomiting. This lasted for about 2-3 weeks and then resolved. For the last couple of weeks she has been entirely asymptomatic with no pain or GI symptoms. She had had a previous diagnosis of cholelithiasis but does not recall exactly how this came up, probably incidental from another x-ray study. Recent abdominal ultrasound was obtained which shows multiple gallstones without gallbladder wall thickening and normal common bile duct. The patient generally is active and currently feels well.  Past Medical History   Diagnosis  Date   .  Atrial fibrillation    .  Aortic insufficiency    .  Hypertension    .  Hyperlipidemia    .  SOB (shortness of breath)    .  Headache(784.0)    .  Gout    .  GERD (gastroesophageal reflux disease)    .  Colitis    .  Arthritis    .  Anxiety    .  Chronic anticoagulation    .  Stroke      TIA-3'08, none recent   .  Melanoma of nose      "was only a precancer area"-no problems since   .  TIA (transient ischemic attack)     Past Surgical History   Procedure  Laterality  Date   .  Cataracts       bilateral   .  Fracture surgery       ORIF-Lt. hip -,removed hardware   .  Eye surgeries       Multiple eye surgeries for torned retina-bilateral   .  Cardioversion       Unsuccessful   .  Blepharoplasty       bilateral   .  Colon polyps       removed via colonoscopy   .  Tonsillectomy     .  Total hip arthroplasty   04/03/2012     Procedure: TOTAL HIP ARTHROPLASTY ANTERIOR APPROACH; Surgeon: Loanne Drilling, MD; Location: [facility]S; Service: Orthopedics; Laterality: Left;    Current  Outpatient Prescriptions   Medication  Sig  Dispense  Refill   .  amLODipine (NORVASC) 2.5 MG tablet  Take 1 tablet (2.5 mg total) by mouth every evening.  30 tablet  6   .  aspirin EC 81 MG tablet  Take 81 mg by mouth daily.     Marland Kitchen  atorvastatin (LIPITOR) 20 MG tablet  Take 1 tablet (20 mg total) by mouth at bedtime.  30 tablet  5   .  calcium citrate (CALCITRATE - DOSED IN MG ELEMENTAL CALCIUM) 950 MG tablet  Take 1 tablet by mouth daily.     .  cholecalciferol (VITAMIN D) 1000 UNITS tablet  Take 1,000 Units by mouth daily.     .  digoxin (LANOXIN) 0.125 MG tablet  Take 1 tablet (125 mcg total) by mouth every morning.  30 tablet  3   .  nebivolol (BYSTOLIC) 5 MG tablet  Take 1 tablet (5 mg total) by mouth every morning.  30 tablet  6   .  nystatin-triamcinolone (MYCOLOG II) cream  Apply 1  application topically daily as needed. On fungus     .  omeprazole (PRILOSEC) 20 MG capsule  Take 20 mg by mouth 2 (two) times daily.     .  sertraline (ZOLOFT) 50 MG tablet  Take 50 mg by mouth every morning.     .  warfarin (COUMADIN) 5 MG tablet  Take 2.5-5 mg by mouth daily. Take 2.5mg  on Wed & Sat. All other days including Sunday take 5mg .      No current facility-administered medications for this visit.    Allergies   Allergen  Reactions   .  Benzonatate  Other (See Comments)     Halluccinations   .  Shrimp [Shellfish Allergy]  Anaphylaxis and Swelling   .  Alendronate Sodium  Other (See Comments)     Unknown reaction   .  Dabigatran Etexilate Mesylate  Other (See Comments)     Upset stomach"Pradaxa"   .  Levofloxacin  Other (See Comments)     "made her funny in the head"   .  Sulfonamide Derivatives  Other (See Comments)     Unknown reaction   .  Tramadol  Other (See Comments)     Unknown reaction    History   Substance Use Topics   .  Smoking status:  Former Smoker -- 0.50 packs/day for 25 years     Types:  Cigarettes     Quit date:  03/13/1990   .  Smokeless tobacco:  Not on file   .   Alcohol Use:  2.5 oz/week     5 drink(s) per week      Comment: wine daily   Review of Systems  Respiratory: Negative.  Cardiovascular: Negative.  Gastrointestinal: Positive for abdominal pain.  Genitourinary: Negative.  Objective:   Physical Exam  BP 156/71  Pulse 47  Temp(Src) 97.5 F (36.4 C) (Oral)  Resp 18  SpO2 98%  General: Alert, Thin Caucasian female appearing her stated age, in no distress  Skin: Warm and dry without rash or infection.  HEENT: No palpable masses or thyromegaly. Sclera nonicteric. Pupils equal round and reactive. Oropharynx clear.  Lymph nodes: No cervical, supraclavicular, or inguinal nodes palpable.  Lungs: Breath sounds clear and equal without increased work of breathing  Cardiovascular: Regular rate and rhythm without murmur. No JVD or edema. Peripheral pulses intact.  Abdomen: Nondistended. Soft and nontender. No masses palpable. No organomegaly. No palpable hernias.  Extremities: No edema or joint swelling or deformity. No chronic venous stasis changes.  Neurologic: Alert and fully oriented. Gait normal.  Assessment:   Recent history of abdominal pain lasting several weeks consistent with biliary colic or chronic cholecystitis. Ultrasound has confirmed multiple gallstones. She currently is asymptomatic. I long discussion with the patient and her husband regarding pros and cons of proceeding with surgery. She does have atrial fibrillation and would require temporary cessation of her anticoagulation. We discussed the nature of the surgery including recovery and risks of bleeding or blood clots, anesthetic complications, bile leak or injury to surrounding organs. We discussed that once gallstones become symptomatic she is at some risk for continued symptoms or worsening problems such as acute cholecystitis or pancreatitis. After our discussion she feels that she would like to proceed with laparoscopic cholecystectomy in order to avoid further symptoms or  worsening complications of her gallstones. I think this is likely the safest course.  Plan:    Patient presents for laparoscopic cholecystectomy with cholangiogram with overnight hospitalization. Dr. Elease Hashimoto had recommended bridging  Lovenox but the patient forgot to take this. She has stopped her Coumadin. I do not believe this puts her at increased risk for surgery. It may increase her risk for thromboembolism but as she has already been off her anticoagulation I think the most reasonable course is to proceed with surgery as planned. This was discussed with her. She has had surgical procedures in the past with only Coumadin held and no bridging Lovenox.  Mariella Saa MD, FACS  02/26/2013, 9:34 AM

## 2013-02-26 NOTE — Transfer of Care (Signed)
Immediate Anesthesia Transfer of Care Note  Patient: Natalie Ho  Procedure(s) Performed: Procedure(s): LAPAROSCOPIC CHOLECYSTECTOMY WITH INTRAOPERATIVE CHOLANGIOGRAM (N/A)  Patient Location: PACU  Anesthesia Type:General  Level of Consciousness: awake, alert  and oriented  Airway & Oxygen Therapy: Patient Spontanous Breathing and Patient connected to face mask oxygen  Post-op Assessment: Report given to PACU RN and Post -op Vital signs reviewed and stable  Post vital signs: Reviewed and stable  Complications: No apparent anesthesia complications

## 2013-02-27 ENCOUNTER — Encounter (HOSPITAL_COMMUNITY): Payer: Self-pay | Admitting: General Surgery

## 2013-02-27 LAB — COMPREHENSIVE METABOLIC PANEL
AST: 28 U/L (ref 0–37)
Albumin: 3.3 g/dL — ABNORMAL LOW (ref 3.5–5.2)
BUN: 8 mg/dL (ref 6–23)
CO2: 32 mEq/L (ref 19–32)
Calcium: 8.8 mg/dL (ref 8.4–10.5)
Chloride: 94 mEq/L — ABNORMAL LOW (ref 96–112)
Creatinine, Ser: 0.75 mg/dL (ref 0.50–1.10)
GFR calc non Af Amer: 78 mL/min — ABNORMAL LOW (ref >90)
Total Bilirubin: 0.9 mg/dL (ref 0.3–1.2)
Total Protein: 6.3 g/dL (ref 6.0–8.3)

## 2013-02-27 LAB — CBC
Hemoglobin: 12.5 g/dL (ref 12.0–15.0)
MCH: 28.4 pg (ref 26.0–34.0)
MCV: 88.4 fL (ref 78.0–100.0)
Platelets: 201 10*3/uL (ref 150–400)
RBC: 4.4 MIL/uL (ref 3.87–5.11)
WBC: 5.3 10*3/uL (ref 4.0–10.5)

## 2013-02-27 MED ORDER — HYDROCODONE-ACETAMINOPHEN 5-325 MG PO TABS
1.0000 | ORAL_TABLET | ORAL | Status: DC | PRN
Start: 2013-02-27 — End: 2013-03-20

## 2013-02-27 NOTE — Progress Notes (Signed)
Patient ID: Natalie Ho, female   DOB: 08-15-1930, 79 y.o.   MRN: [id number] 1 Day Post-Op  Subjective: No complaints. Denies pain or nausea.  Objective: Vital signs in last 24 hours: Temp:  [97.4 F (36.3 C)-98.1 F (36.7 C)] 98.1 F (36.7 C) (12/18 0500) Pulse Rate:  [42-70] 60 (12/18 0500) Resp:  [11-18] 16 (12/18 0500) BP: (110-160)/(44-85) 145/63 mmHg (12/18 0500) SpO2:  [91 %-100 %] 91 % (12/18 0500) Weight:  [140 lb (63.504 kg)] 140 lb (63.504 kg) (12/17 1600) Last BM Date: 02/26/13  Intake/Output from previous day: 12/17 0701 - 12/18 0700 In: 2731.3 [P.O.:480; I.V.:2251.3] Out: 5 [Blood:5] Intake/Output this shift:    General appearance: alert, cooperative and no distress GI: normal findings: soft, non-tender Incision/Wound: clean and dry without bleeding or infection  Lab Results:   Recent Labs  02/24/13 1425 02/27/13 0555  WBC 4.4 5.3  HGB 14.1 12.5  HCT 43.0 38.9  PLT 228 201   BMET  Recent Labs  02/24/13 1425 02/27/13 0555  NA 132* 131*  K 4.2 4.1  CL 94* 94*  CO2 30 32  GLUCOSE 184* 121*  BUN 13 8  CREATININE 0.74 0.75  CALCIUM 9.5 8.8   Lab Results  Component Value Date   ALT 26 02/27/2013   AST 28 02/27/2013   ALKPHOS 77 02/27/2013   BILITOT 0.9 02/27/2013     Studies/Results: Dg Cholangiogram Operative  02/26/2013   CLINICAL DATA:  Cholelithiasis  EXAM: INTRAOPERATIVE CHOLANGIOGRAM  TECHNIQUE: Cholangiographic images from the C-arm fluoroscopic device were submitted for interpretation post-operatively. Please see the procedural report for the amount of contrast and the fluoroscopy time utilized.  COMPARISON:  None.  FINDINGS: 16 seconds of fluoroscopy was utilized. Injection into the cystic duct remnant reveals opacification of the common bile duct. Free flow of contrast material into the duodenum is seen. No filling defects are identified to suggest retained common bile duct stones.   Electronically Signed   By: Alcide Clever M.D.    On: 02/26/2013 12:00    Anti-infectives: Anti-infectives   Start     Dose/Rate Route Frequency Ordered Stop   02/26/13 0734  ceFAZolin (ANCEF) IVPB 2 g/50 mL premix     2 g 100 mL/hr over 30 Minutes Intravenous On call to O.R. 02/26/13 0734 02/26/13 0949      Assessment/Plan: s/p Procedure(s): LAPAROSCOPIC CHOLECYSTECTOMY WITH INTRAOPERATIVE CHOLANGIOGRAM Doing very well postoperatively without complication. Okay for discharge today.   LOS: 1 day    Dreydon Cardenas T 02/27/2013

## 2013-02-27 NOTE — Discharge Summary (Signed)
   Patient ID: Natalie Ho [id number] 78 y.o. 1930/11/01  02/26/2013  Discharge date and time: 02/27/2013   Admitting Physician: Glenna Fellows T  Discharge Physician: Glenna Fellows T  Admission Diagnoses: gallstones   Discharge Diagnoses: same  Operations: Procedure(s): LAPAROSCOPIC CHOLECYSTECTOMY WITH INTRAOPERATIVE CHOLANGIOGRAM  Admission Condition: good  Discharged Condition: good  Indication for Admission: patient is a 79 year old female who has had several episodes of significant abdominal pain typical for biliary colic. Workup has confirmed cholelithiasis. She is electively admitted for laparoscopic cholecystectomy and plan overnight observation  Hospital Course: patient underwent an uneventful laparoscopic cholecystectomy with cholangiogram. She tolerated the procedure well and had no complications. On the day of discharge she is without pain or nausea. Abdomen is soft and nontender. Vital signs are normal and postop labs is unremarkable.   Disposition: Home  Patient Instructions:    Medication List    STOP taking these medications       enoxaparin 60 MG/0.6ML injection  Commonly known as:  LOVENOX      TAKE these medications       amLODipine 2.5 MG tablet  Commonly known as:  NORVASC  Take 2.5 mg by mouth every evening.     aspirin EC 81 MG tablet  Take 81 mg by mouth daily.     atorvastatin 20 MG tablet  Commonly known as:  LIPITOR  Take 1 tablet (20 mg total) by mouth at bedtime.     calcium citrate 950 MG tablet  Commonly known as:  CALCITRATE - dosed in mg elemental calcium  Take 1 tablet by mouth daily.     cholecalciferol 1000 UNITS tablet  Commonly known as:  VITAMIN D  Take 1,000 Units by mouth daily.     digoxin 0.125 MG tablet  Commonly known as:  LANOXIN  Take 125 mcg by mouth every morning.     HYDROcodone-acetaminophen 5-325 MG per tablet  Commonly known as:  NORCO/VICODIN  Take 1-2 tablets by mouth every 4 (four)  hours as needed for moderate pain.     nebivolol 5 MG tablet  Commonly known as:  BYSTOLIC  Take 1 tablet (5 mg total) by mouth every morning.     nystatin-triamcinolone cream  Commonly known as:  MYCOLOG II  Apply 1 application topically daily as needed (fungus). On fungus     omeprazole 20 MG capsule  Commonly known as:  PRILOSEC  Take 20 mg by mouth daily.     sertraline 50 MG tablet  Commonly known as:  ZOLOFT  Take 50 mg by mouth every morning.     warfarin 5 MG tablet  Commonly known as:  COUMADIN  Take 5 mg by mouth at bedtime. Take as directed by coumadin clinic        Activity: activity as tolerated Diet: regular diet Wound Care: none needed  Follow-up:  With Dr. Johna Sheriff in 3 weeks.  Signed: Mariella Saa MD, FACS  02/27/2013, 7:44 AM

## 2013-03-11 ENCOUNTER — Telehealth (INDEPENDENT_AMBULATORY_CARE_PROVIDER_SITE_OTHER): Payer: Self-pay

## 2013-03-11 ENCOUNTER — Ambulatory Visit (INDEPENDENT_AMBULATORY_CARE_PROVIDER_SITE_OTHER): Payer: Medicare Other | Admitting: Pharmacist

## 2013-03-11 DIAGNOSIS — I4891 Unspecified atrial fibrillation: Secondary | ICD-10-CM

## 2013-03-11 LAB — POCT INR: INR: 2.3

## 2013-03-11 NOTE — Telephone Encounter (Signed)
Called and spoke to patient with post op appointment for 03/20/13 @ 10:15 am w/Dr. Johna Sheriff.

## 2013-03-11 NOTE — Telephone Encounter (Signed)
Message copied by Maryan Puls on Tue Mar 11, 2013  8:48 AM ------      Message from: Mervin Kung      Created: Fri Mar 07, 2013  1:09 PM      Contact: [phone]       Rebeckah Masih,      Pt needs a po check for GB. Please call her back with appt time.       sonya            03/10/2013 @ 9:57 pt called back wants to know about the appt for PO. Please call her as soon as you can.       sonya ------

## 2013-03-20 ENCOUNTER — Encounter (INDEPENDENT_AMBULATORY_CARE_PROVIDER_SITE_OTHER): Payer: Self-pay | Admitting: General Surgery

## 2013-03-20 ENCOUNTER — Ambulatory Visit (INDEPENDENT_AMBULATORY_CARE_PROVIDER_SITE_OTHER): Payer: Medicare Other | Admitting: General Surgery

## 2013-03-20 VITALS — BP 120/60 | HR 60 | Temp 98.0°F | Resp 14 | Ht 65.0 in | Wt 134.0 lb

## 2013-03-20 DIAGNOSIS — Z09 Encounter for follow-up examination after completed treatment for conditions other than malignant neoplasm: Secondary | ICD-10-CM

## 2013-03-20 NOTE — Progress Notes (Signed)
History: The patient returns for her postop check following arthroscopic cholecystectomy and cholangiogram for recurrent episodes of abdominal pain and cholelithiasis. She states she is done along very well with no concerns. She feels her abdominal pain has been relieved. She is back to normal activities.  Exam: BP 120/60  Pulse 60  Temp(Src) 98 F (36.7 C) (Temporal)  Resp 14  Ht 5\' 5"  (1.651 m)  Wt 134 lb (60.782 kg)  BMI 22.30 kg/m2 General: Appears well Abdomen: Soft and nontender. There is some bruising and a slight lump under the epigastric incision consistent with a small hematoma  Pathology: Showed cholelithiasis and cholecystitis  Assessment and plan: Doing well following arthroscopic cholecystectomy without complications and good relief of symptoms. She is discharged to return as needed.

## 2013-04-21 ENCOUNTER — Other Ambulatory Visit: Payer: Self-pay

## 2013-04-21 MED ORDER — ATORVASTATIN CALCIUM 20 MG PO TABS: 20.0000 mg | ORAL_TABLET | Freq: Every day | ORAL | Status: DC

## 2013-04-22 ENCOUNTER — Ambulatory Visit (INDEPENDENT_AMBULATORY_CARE_PROVIDER_SITE_OTHER): Payer: Medicare Other | Admitting: *Deleted

## 2013-04-22 DIAGNOSIS — I4891 Unspecified atrial fibrillation: Secondary | ICD-10-CM

## 2013-04-22 DIAGNOSIS — Z5181 Encounter for therapeutic drug level monitoring: Secondary | ICD-10-CM

## 2013-04-22 LAB — POCT INR: INR: 2.3

## 2013-05-13 ENCOUNTER — Other Ambulatory Visit: Payer: Self-pay

## 2013-05-13 MED ORDER — NEBIVOLOL HCL 5 MG PO TABS: 5.0000 mg | ORAL_TABLET | Freq: Every morning | ORAL | Status: DC

## 2013-06-02 ENCOUNTER — Ambulatory Visit (INDEPENDENT_AMBULATORY_CARE_PROVIDER_SITE_OTHER): Payer: Medicare Other | Admitting: Pharmacist

## 2013-06-02 DIAGNOSIS — I4891 Unspecified atrial fibrillation: Secondary | ICD-10-CM

## 2013-06-02 DIAGNOSIS — Z5181 Encounter for therapeutic drug level monitoring: Secondary | ICD-10-CM

## 2013-06-02 LAB — POCT INR: INR: 1.7

## 2013-06-05 ENCOUNTER — Other Ambulatory Visit: Payer: Self-pay

## 2013-06-05 ENCOUNTER — Telehealth: Payer: Self-pay

## 2013-06-05 MED ORDER — ATORVASTATIN CALCIUM 20 MG PO TABS: 20.0000 mg | ORAL_TABLET | Freq: Every day | ORAL | Status: DC

## 2013-06-06 NOTE — Telephone Encounter (Addendum)
06-06-13 this is not an EP pt, she see's Dr. Acie Fredrickson, and she has an  appt with  him already on 08-11-13/mt

## 2013-07-01 ENCOUNTER — Ambulatory Visit (INDEPENDENT_AMBULATORY_CARE_PROVIDER_SITE_OTHER): Payer: Medicare Other | Admitting: *Deleted

## 2013-07-01 DIAGNOSIS — Z5181 Encounter for therapeutic drug level monitoring: Secondary | ICD-10-CM

## 2013-07-01 DIAGNOSIS — I4891 Unspecified atrial fibrillation: Secondary | ICD-10-CM

## 2013-07-01 LAB — POCT INR: INR: 3.4

## 2013-07-15 ENCOUNTER — Ambulatory Visit (INDEPENDENT_AMBULATORY_CARE_PROVIDER_SITE_OTHER): Payer: Medicare Other | Admitting: Pharmacist

## 2013-07-15 DIAGNOSIS — Z5181 Encounter for therapeutic drug level monitoring: Secondary | ICD-10-CM

## 2013-07-15 DIAGNOSIS — I4891 Unspecified atrial fibrillation: Secondary | ICD-10-CM

## 2013-07-15 LAB — POCT INR: INR: 2.5

## 2013-08-07 ENCOUNTER — Other Ambulatory Visit: Payer: Self-pay

## 2013-08-07 ENCOUNTER — Other Ambulatory Visit: Payer: Self-pay | Admitting: *Deleted

## 2013-08-07 MED ORDER — ATORVASTATIN CALCIUM 20 MG PO TABS: 20.0000 mg | ORAL_TABLET | Freq: Every day | ORAL | Status: DC

## 2013-08-07 MED ORDER — WARFARIN SODIUM 5 MG PO TABS: ORAL_TABLET | ORAL | Status: DC

## 2013-08-11 ENCOUNTER — Ambulatory Visit (INDEPENDENT_AMBULATORY_CARE_PROVIDER_SITE_OTHER): Payer: Medicare Other | Admitting: Pharmacist

## 2013-08-11 ENCOUNTER — Ambulatory Visit (INDEPENDENT_AMBULATORY_CARE_PROVIDER_SITE_OTHER): Payer: Medicare Other | Admitting: Cardiovascular Disease

## 2013-08-11 ENCOUNTER — Encounter (INDEPENDENT_AMBULATORY_CARE_PROVIDER_SITE_OTHER): Payer: Self-pay

## 2013-08-11 ENCOUNTER — Encounter: Payer: Self-pay | Admitting: Cardiovascular Disease

## 2013-08-11 VITALS — BP 110/66 | HR 62 | Ht 65.0 in | Wt 137.0 lb

## 2013-08-11 DIAGNOSIS — I4891 Unspecified atrial fibrillation: Secondary | ICD-10-CM

## 2013-08-11 DIAGNOSIS — I10 Essential (primary) hypertension: Secondary | ICD-10-CM

## 2013-08-11 DIAGNOSIS — Z5181 Encounter for therapeutic drug level monitoring: Secondary | ICD-10-CM

## 2013-08-11 LAB — POCT INR: INR: 2.5

## 2013-08-11 MED ORDER — NEBIVOLOL HCL 5 MG PO TABS
5.0000 mg | ORAL_TABLET | Freq: Every morning | ORAL | Status: DC
Start: 2013-08-11 — End: 2014-09-10

## 2013-08-11 MED ORDER — DIGOXIN 125 MCG PO TABS: 125.0000 ug | ORAL_TABLET | Freq: Every morning | ORAL | Status: DC

## 2013-08-11 MED ORDER — ATORVASTATIN CALCIUM 20 MG PO TABS: 20.0000 mg | ORAL_TABLET | Freq: Every day | ORAL | Status: AC

## 2013-08-11 NOTE — Patient Instructions (Signed)
Your physician has recommended you make the following change in your medication:  STOP Amlodipine  Your physician wants you to follow-up in: 1 year with Dr. Acie Fredrickson.  You will receive a reminder letter in the mail two months in advance. If you don't receive a letter, please call our office to schedule the follow-up appointment.

## 2013-08-11 NOTE — Assessment & Plan Note (Signed)
BP is well controlled 

## 2013-08-11 NOTE — Progress Notes (Signed)
Natalie Ho  Date of Birth  [birth year] [facility] [address]    [place], [place]  [address] [phone]  Fax  [phone]  Problems: 1. Atrial fibrillation 2. Remote stroke 3. Aortic insufficiency 4. Hypertension 5. Hypercholesterolemia  History of Present Illness:  80 yo Hx of a-Fib, remote stroke, HTN, hypercholesterolemia.  Her last visit we added amlodipine 2.5 mg a day. She also started taking her Benicar 20 mg twice a day instead of 40 mg at once. She is feeling quite a bit better. Her blood pressure readings have stabilized. She still occasionally has some signs of mild hypertension with a systolic blood pressure 220.      She also has episodes where her blood pressure will become fairly low especially at night. They're given some nights when she has held her medication for several hours because her blood pressure was in the 90 range.  She's not been limited by chest pain or shortness of breath.  She has been having some problems with hypertension.  We added amlodipine 2.5 mg at her last visit.  Her blood pressures have been better.    Jul 11, 2012:  She has done well after her left hip replacement.   She has some dyspnea but is overall doing well.   She has stopped her Benicar and is feeling much better.  She was having low BP readings.    Current Outpatient Prescriptions on File Prior to Visit  Medication Sig Dispense Refill  . amLODipine (NORVASC) 2.5 MG tablet Take 2.5 mg by mouth every evening.      Marland Kitchen aspirin EC 81 MG tablet Take 81 mg by mouth daily.      Marland Kitchen atorvastatin (LIPITOR) 20 MG tablet Take 1 tablet (20 mg total) by mouth at bedtime.  30 tablet  0  . calcium citrate (CALCITRATE - DOSED IN MG ELEMENTAL CALCIUM) 950 MG tablet Take 1 tablet by mouth daily.      . cholecalciferol (VITAMIN D) 1000 UNITS tablet Take 1,000 Units by mouth daily.      . digoxin (LANOXIN) 0.125 MG tablet Take 125 mcg by mouth every morning.      .  nebivolol (BYSTOLIC) 5 MG tablet Take 1 tablet (5 mg total) by mouth every morning.  30 tablet  3  . nystatin-triamcinolone (MYCOLOG II) cream Apply 1 application topically daily as needed (fungus). On fungus      . omeprazole (PRILOSEC) 20 MG capsule Take 20 mg by mouth daily.       . sertraline (ZOLOFT) 50 MG tablet Take 50 mg by mouth every morning.       . warfarin (COUMADIN) 5 MG tablet 1 pill everyday except 1/2 pill on Wednesdays and Saturdays or as directed by coumadin clinic  30 tablet  3   No current facility-administered medications on file prior to visit.    Allergies  Allergen Reactions  . Benzonatate Other (See Comments)    Unknown reaction  . Shrimp [Shellfish Allergy] Anaphylaxis and Swelling  . Alendronate Sodium Other (See Comments)    Eyes and head hurt  . Dabigatran Etexilate Mesylate Other (See Comments)    Upset stomach"Pradaxa"  . Levofloxacin Other (See Comments)    "made her funny in the head"  . Pradaxa [Dabigatran Etexilate Mesylate] Other (See Comments)    "sick to my stomach and lost weight"  . Sulfonamide Derivatives Other (See Comments)    Unknown reaction  . Tramadol Other (See Comments)    "  made me feel funny in my head"    Past Medical History  Diagnosis Date  . Atrial fibrillation   . Aortic insufficiency   . Hypertension   . Hyperlipidemia   . SOB (shortness of breath)   . Gout   . GERD (gastroesophageal reflux disease)   . Colitis   . Arthritis   . Anxiety   . Chronic anticoagulation   . Melanoma of nose     "was only a precancer area"-no problems since  . TIA (transient ischemic attack)   . Stroke      2007 and  TIA-3'08, none recent  . Glaucoma     both eyes  . Tinnitus of both ears     Past Surgical History  Procedure Laterality Date  . Cataracts      bilateral  . Fracture surgery      ORIF-Lt. hip -,removed hardware  . Eye surgeries      Multiple eye surgeries for torned retina-bilateral  . Cardioversion       Unsuccessful  . Blepharoplasty      bilateral  . Colon polyps      removed via colonoscopy  . Tonsillectomy    . Total hip arthroplasty  04/03/2012    Procedure: TOTAL HIP ARTHROPLASTY ANTERIOR APPROACH;  Surgeon: Gearlean Alf, MD;  Location: [facility];  Service: Orthopedics;  Laterality: Left;  . Cholecystectomy N/A 02/26/2013    Procedure: LAPAROSCOPIC CHOLECYSTECTOMY WITH INTRAOPERATIVE CHOLANGIOGRAM;  Surgeon: Edward Jolly, MD;  Location: [facility];  Service: General;  Laterality: N/A;    History  Smoking status  . Former Smoker -- 0.50 packs/day for 25 years  . Types: Cigarettes  . Quit date: 03/13/1990  Smokeless tobacco  . Never Used    History  Alcohol Use  . 2.5 oz/week  . 5 drink(s) per week    Comment: wine daily    Family History  Problem Relation Age of Onset  . Heart disease Mother     Reviw of Systems:  Reviewed in the HPI.  All other systems are negative.  Physical Exam: BP 110/66  Pulse 62  Ht 5\' 5"  (1.651 m)  Wt 137 lb (62.143 kg)  BMI 22.80 kg/m2 The patient is alert and oriented x 3.  The mood and affect are normal.   Skin: warm and dry.  Color is normal.    HEENT:   the sclera are nonicteric.  The mucous membranes are moist.  The carotids are 2+ without bruits.  There is no thyromegaly.  There is no JVD.    Lungs: clear.  The chest wall is non tender.    Heart: Irregularly irregular normal S1-S2.Marland Kitchen  There are no murmurs, gallops, or rubs. The PMI is not displaced.     Abdomen: good bowel sounds.  There is no guarding or rebound.  There is no hepatosplenomegaly or tenderness.  There are no masses.   Extremities:  no clubbing, cyanosis, or edema.  The legs are without rashes.  The distal pulses are intact.   Neuro:  Cranial nerves II - XII are intact.  Motor and sensory functions are intact.    The gait is normal.  ECG:  Jul 11, 2012:  Atrial fib at rate of 59.   Assessment / Plan:

## 2013-08-11 NOTE — Assessment & Plan Note (Signed)
Natalie Ho remains stable.  Asymptomatic.  Continue current meds

## 2013-09-09 ENCOUNTER — Ambulatory Visit (INDEPENDENT_AMBULATORY_CARE_PROVIDER_SITE_OTHER): Payer: Medicare Other | Admitting: Pharmacist Clinician (PhC)/ Clinical Pharmacy Specialist

## 2013-09-09 DIAGNOSIS — I4891 Unspecified atrial fibrillation: Secondary | ICD-10-CM

## 2013-09-09 DIAGNOSIS — Z5181 Encounter for therapeutic drug level monitoring: Secondary | ICD-10-CM

## 2013-09-09 LAB — POCT INR: INR: 2.2

## 2013-10-07 ENCOUNTER — Ambulatory Visit (INDEPENDENT_AMBULATORY_CARE_PROVIDER_SITE_OTHER): Payer: Medicare Other

## 2013-10-07 DIAGNOSIS — Z5181 Encounter for therapeutic drug level monitoring: Secondary | ICD-10-CM

## 2013-10-07 DIAGNOSIS — I4891 Unspecified atrial fibrillation: Secondary | ICD-10-CM

## 2013-10-07 LAB — POCT INR: INR: 2.4

## 2013-11-03 ENCOUNTER — Other Ambulatory Visit: Payer: Self-pay | Admitting: Obstetrics & Gynecology

## 2013-11-04 LAB — CYTOLOGY - PAP

## 2013-11-18 ENCOUNTER — Ambulatory Visit (INDEPENDENT_AMBULATORY_CARE_PROVIDER_SITE_OTHER): Payer: Medicare Other | Admitting: Pharmacist Clinician (PhC)/ Clinical Pharmacy Specialist

## 2013-11-18 DIAGNOSIS — I4891 Unspecified atrial fibrillation: Secondary | ICD-10-CM

## 2013-11-18 DIAGNOSIS — Z5181 Encounter for therapeutic drug level monitoring: Secondary | ICD-10-CM

## 2013-11-18 LAB — POCT INR: INR: 2.7

## 2013-12-08 ENCOUNTER — Other Ambulatory Visit: Payer: Self-pay | Admitting: *Deleted

## 2013-12-08 MED ORDER — WARFARIN SODIUM 5 MG PO TABS: ORAL_TABLET | ORAL | Status: DC

## 2013-12-18 ENCOUNTER — Telehealth: Payer: Self-pay | Admitting: Cardiovascular Disease

## 2013-12-18 NOTE — Telephone Encounter (Signed)
Telephoned pt and instructed her to use PRN Zyrtec, Allegra, and or Claritin, not any D form of any of these and no Pseudoephedrine due to HTN and Afib. Pt verbalizes understanding.

## 2013-12-18 NOTE — Telephone Encounter (Signed)
New message      Pt is on coumadin.  Can she take pseudoephedrine for her sinuses?

## 2013-12-22 ENCOUNTER — Telehealth: Payer: Self-pay | Admitting: Cardiovascular Disease

## 2013-12-22 NOTE — Telephone Encounter (Signed)
new message     Pt is on hydrocodon acephaminophen 5-325mg  for shingles and valacyclovir hcl 1gm.  She is also on coumadin.Marland Kitchen

## 2013-12-22 NOTE — Telephone Encounter (Signed)
Returned call to pt advised hydrocodone and valacyclovir are ok to take with Coumadin.  No need to adjust Coumadin dosage or check INR sooner than scheduled f/u.  Pt verbalized understanding.

## 2013-12-30 ENCOUNTER — Ambulatory Visit (INDEPENDENT_AMBULATORY_CARE_PROVIDER_SITE_OTHER): Payer: Medicare Other | Admitting: *Deleted

## 2013-12-30 DIAGNOSIS — Z5181 Encounter for therapeutic drug level monitoring: Secondary | ICD-10-CM

## 2013-12-30 DIAGNOSIS — I4891 Unspecified atrial fibrillation: Secondary | ICD-10-CM

## 2013-12-30 LAB — POCT INR: INR: 2.1

## 2014-01-27 ENCOUNTER — Ambulatory Visit: Payer: Medicare Other | Admitting: Internal Medicine

## 2014-02-10 ENCOUNTER — Ambulatory Visit (INDEPENDENT_AMBULATORY_CARE_PROVIDER_SITE_OTHER): Payer: Medicare Other

## 2014-02-10 DIAGNOSIS — Z5181 Encounter for therapeutic drug level monitoring: Secondary | ICD-10-CM

## 2014-02-10 DIAGNOSIS — I4891 Unspecified atrial fibrillation: Secondary | ICD-10-CM

## 2014-02-10 LAB — POCT INR: INR: 2.4

## 2014-02-12 ENCOUNTER — Encounter: Payer: Self-pay | Admitting: Internal Medicine

## 2014-02-12 ENCOUNTER — Other Ambulatory Visit: Payer: Medicare Other

## 2014-02-12 ENCOUNTER — Ambulatory Visit (INDEPENDENT_AMBULATORY_CARE_PROVIDER_SITE_OTHER): Payer: Medicare Other | Admitting: Internal Medicine

## 2014-02-12 ENCOUNTER — Ambulatory Visit (INDEPENDENT_AMBULATORY_CARE_PROVIDER_SITE_OTHER)
Admission: RE | Admit: 2014-02-12 | Discharge: 2014-02-12 | Disposition: A | Discharge status: Home / Self Care | Payer: Medicare Other | Source: Ambulatory Visit | Attending: Internal Medicine | Admitting: Internal Medicine

## 2014-02-12 VITALS — BP 128/62 | HR 51 | Ht 65.0 in | Wt 140.2 lb

## 2014-02-12 DIAGNOSIS — R0609 Other forms of dyspnea: Secondary | ICD-10-CM

## 2014-02-12 DIAGNOSIS — Z23 Encounter for immunization: Secondary | ICD-10-CM

## 2014-02-12 DIAGNOSIS — J4521 Mild intermittent asthma with (acute) exacerbation: Secondary | ICD-10-CM

## 2014-02-12 NOTE — Progress Notes (Signed)
08/24/10- Dr Duaine Dredge of Dr Lamonte Sakai for chronic cough and PNDS. Has also been evaluated for a RUL nodule in past which is now deemed to be benign. Seen as add-on 6/13 with persistent to worsening cough, particularly in the past couple of weeks. Had 2 episodes of scant hemoptysis on 6/12. No increase in SOB. Denies CP or pleurodynia. Denies F/C/S, LE edema or calf tenderness. On warfarin for CAF. PT last checked approx 3 wks ago and has been in therapeutic range consistently. Denies overt epistaxis but thinks blood might have come from nose.  04/14/11- 30 yoF former smoker  Newly referred to [facility] by  [facility] with complaint of trouble breathing when exercising. Husband is here. Over the last year she's been more aware of dyspnea with exertion. She is comfortable at rest. The sensation has been constant without any original event recognized, variation from day-to-day or progression. This is noticed mainly if she is walking up an incline, taking stairs quickly without stopping or otherwise exercising a little harder than usual ADLs. She was seen here in June as noted above will followup of her chronic cough problems which had been present for years. Cough has been mostly dry with occasional wheeze. Heavy dust exposure may be a trigger. Atrial fibrillation diagnosed about 2007, no pacemaker/Dr. Acie Fredrickson. Denies anemia.  Denies history of pneumonia or TB exposure. PPD negative in the past. She worked in the past/[place] with possibility of histoplasma exposure but no rebound liver is. No diagnosis of sarcoid. Soc- Married housewife, quit cigs 1992. CXR- 04/14/11- images reviewed: IMPRESSION:  No acute cardiopulmonary process. Central vascular congestion  without overt edema.  Evidence of prior granulomatous disease.  Original Report Authenticated By: Arline Asp, M.D.   05/12/11- 20 yoF former smoker followed for dyspnea on exertion, complicated by hx lung nodule, GERD, AFib,  HBP, TIA. Little cough or phlegm. Still gets short of breath with exertion. Tolerated eye surgery. PFT: 04/20/2011-mild obstructive airways disease with insignificant response to bronchodilator, air trapping, diffusion mildly reduced. FEV1/FVC 0.63, DLCO 76%. 6 minute walk test-97%, 96%, 98%, 513 m. Well-maintained oxygenation.  06/26/11- 54 yoF former smoker followed for dyspnea on exertion, complicated by hx lung nodule, GERD, AFib, HBP, TIA. Acute visit-cough-productive at times-yellow; coughed up blood on Friday-not a lot. Husband here. Tudorza sample made her cough worse.  We called in an antibiotic which is helping acute bronchitis. Producing less phlegm. Codeine cough syrup is some help.  08/10/11- 89 yoF former smoker followed for dyspnea on exertion, complicated by hx lung nodule, GERD, AFib, HBP, TIA.    Dr Melford Aase PCP    Husband here Reports doing much better. Used up sample [facility]- says no longer needed. Benzonatate caused hallucination. We discussed CXR as reviewed w/ her- suggested vascular congestion, so improvement may have started with that. She no longer feels palpitation and denies orthopnea or swelling.   CXR 04/28/11 IMPRESSION:  No acute cardiopulmonary process. Central vascular congestion  without overt edema.  Evidence of prior granulomatous disease.  Original Report Authenticated By: Arline Asp, M.D.    02/05/12- 5 yoF former smoker followed for dyspnea on exertion, asthma/ bronchitis, complicated by hx lung nodule, GERD, AFib, HBP, TIA.    Dr Melford Aase PCP    Husband here Follows For:SOB about the same - Occas non prod cough - Occas wheezing - Denies chest tightness Cough most days is nonproductive. Some dyspnea on exertion, not worse. Plans hip replacement in January and the arthritis limits  her activity. Denies chest pain, palpitation, purulent or bloody sputum.  04/19/12 Acute OV  Complains of pt c/o increased SOB with activity and wheezing. Pt states these  symptoms are worse in the mornings and bedtime. . Symptoms have been worse x 10 days.  Pt also c/o increased swelling in feet and ankles.  Complains of DOE. Bilateral leg swelling . Weight is up 15lbs.  Denies chest pain , orthopnea, fever , discolored mucus or fever. No calf pain  Had elective left hip replacement 04/03/12 . Has had good recovery w/ rehab at home.  Has hx of Atrial Fib on chronic coumadin .  INR therapeutic last week at 2.2 .  CXR shows bilateral small pleural effusions.   01/27/13- 35 yoF former smoker followed for dyspnea on exertion, asthma/ bronchitis, complicated by hx lung nodule, GERD, AFib, HBP, TIA. FOLLOWS FOR:  Breathing unchanged since last OV. No concerns today Feet no longer swell CT chest 04/20/12 IMPRESSION:  1. No evidence of acute pulmonary embolism.  2. Small pleural effusions, cardiomegaly, and slightly prominent  interstitial markings may indicate very mild interstitial edema.  3. Calcified mediastinal and hilar nodes with calcified right  upper lobe granuloma consistent with prior granulomatous disease.  Original Report Authenticated By: Ivar Drape, M.D  02/12/14- 32 yoF former smoker followed for dyspnea on exertion, asthma/ bronchitis, complicated by hx lung nodule, GERD, AFib, HBP, TIA, glaucoma.   Husband here  FOLLOWS FOR: recently getting over a cold. Did not go to PCP or call here. Says she has gotten over recent acute bronchitis from a cold and is well now. Admits mild baseline cough with scant white sputum.  ROS-see HPI Constitutional:   No-   weight loss, night sweats, fevers, chills, fatigue, lassitude. HEENT:   No-  headaches, difficulty swallowing, tooth/dental problems, sore throat,       No-  sneezing, itching, ear ache, nasal congestion, post nasal drip,  CV:  No-   chest pain, orthopnea, PND, swelling in lower extremities, anasarca,  dizziness, palpitations Resp: No-   shortness of breath with exertion or at rest.              No-    productive cough, +  non-productive cough,  No- coughing up of blood.              No-   change in color of mucus.  No- wheezing.   Skin: No-   rash or lesions. GI:  No-   heartburn, indigestion, abdominal pain, nausea, vomiting, GU: . MS:  No-   joint pain or swelling.   Neuro-     nothing unusual Psych:  No- change in mood or affect. No depression or anxiety.  No memory loss.  OBJ- Physical Exam General- Alert, Oriented, Affect-appropriate, Distress- none acute Skin- rash-none, lesions- none, excoriation- none Lymphadenopathy- none Head- atraumatic            Eyes- Gross vision intact, PERRLA, conjunctivae and secretions clear            Ears- Hearing, canals-normal            Nose- Clear, no-Septal dev, mucus, polyps, erosion, perforation             Throat- Mallampati II , mucosa clear , drainage- none, tonsils- atrophic Neck- flexible , trachea midline, no stridor , thyroid nl, carotid no bruit Chest - symmetrical excursion , unlabored           Heart/CV- RRR , no murmur ,  no gallop  , no rub, nl s1 s2                           - JVD- none , edema- none, stasis changes- none, varices- none           Lung- clear to P&A, wheeze- none, cough- none , dullness-none, rub- none           Chest wall-  Abd-  Br/ Gen/ Rectal- Not done, not indicated Extrem- cyanosis- none, clubbing, none, atrophy- none, strength- nl Neuro- grossly intact to observation

## 2014-02-12 NOTE — Patient Instructions (Addendum)
Flu vax  Walk as you are able.to rebuild some stamina  Order- CXR   Dx Dyspnea on exertion

## 2014-02-14 NOTE — Assessment & Plan Note (Signed)
Recent acute bronchitis now mostly back to baseline with a little residual cough Plan-flu vaccine with discussion, walk for endurance as able

## 2014-03-24 ENCOUNTER — Ambulatory Visit (INDEPENDENT_AMBULATORY_CARE_PROVIDER_SITE_OTHER): Payer: Medicare Other | Admitting: *Deleted

## 2014-03-24 DIAGNOSIS — Z5181 Encounter for therapeutic drug level monitoring: Secondary | ICD-10-CM

## 2014-03-24 DIAGNOSIS — I4891 Unspecified atrial fibrillation: Secondary | ICD-10-CM

## 2014-03-24 LAB — POCT INR: INR: 1.8

## 2014-04-08 ENCOUNTER — Other Ambulatory Visit: Payer: Self-pay | Admitting: *Deleted

## 2014-04-08 MED ORDER — WARFARIN SODIUM 5 MG PO TABS: ORAL_TABLET | ORAL | Status: DC

## 2014-04-20 ENCOUNTER — Ambulatory Visit (INDEPENDENT_AMBULATORY_CARE_PROVIDER_SITE_OTHER): Payer: Medicare Other

## 2014-04-20 DIAGNOSIS — I4891 Unspecified atrial fibrillation: Secondary | ICD-10-CM

## 2014-04-20 DIAGNOSIS — Z5181 Encounter for therapeutic drug level monitoring: Secondary | ICD-10-CM

## 2014-04-20 LAB — POCT INR: INR: 3.1

## 2014-05-08 ENCOUNTER — Telehealth: Payer: Self-pay | Admitting: Internal Medicine

## 2014-05-08 MED ORDER — PROMETHAZINE-CODEINE 6.25-10 MG/5ML PO SYRP: 2.5000 mL | ORAL_SOLUTION | Freq: Four times a day (QID) | ORAL | Status: DC | PRN

## 2014-05-08 MED ORDER — AMOXICILLIN 500 MG PO TABS: 500.0000 mg | ORAL_TABLET | Freq: Three times a day (TID) | ORAL | Status: DC

## 2014-05-08 NOTE — Telephone Encounter (Signed)
Zpak interacts with dig and coumadin.  Dig: increase in dig serum concentration Coumadin: increase in hypoprothrombinemic effects  CY please advise.

## 2014-05-08 NOTE — Telephone Encounter (Signed)
Spoke with pt. Reports cough x10 days. Cough is producing yellow mucus and is causing rib pain. Denies chest tightness or SOB. Has tried Delsym with no relief. Would like CY's recommendations.  Allergies  Allergen Reactions  . Benzonatate Other (See Comments)    Unknown reaction  . Shrimp [Shellfish Allergy] Anaphylaxis and Swelling  . Alendronate Sodium Other (See Comments)    Eyes and head hurt  . Dabigatran Etexilate Mesylate Other (See Comments)    Upset stomach"Pradaxa"  . Levofloxacin Other (See Comments)    "made her funny in the head"  . Pradaxa [Dabigatran Etexilate Mesylate] Other (See Comments)    "sick to my stomach and lost weight"  . Sulfonamide Derivatives Other (See Comments)    Unknown reaction  . Tramadol Other (See Comments)    "made me feel funny in my head"    CY - please advise. Thanks.

## 2014-05-08 NOTE — Telephone Encounter (Signed)
Offer Zpak           prometh codeine cough syrup  140 ml,    2.5 to 5 ml every 6 hours as needed for cough

## 2014-05-08 NOTE — Telephone Encounter (Signed)
Rx has been sent in. Pt's husband is aware.

## 2014-05-08 NOTE — Telephone Encounter (Signed)
Instead of Zpak offer amoxacillin 500 mg, # 21, 1 three times daily

## 2014-05-15 ENCOUNTER — Encounter: Payer: Self-pay | Admitting: Internal Medicine

## 2014-05-15 ENCOUNTER — Ambulatory Visit (INDEPENDENT_AMBULATORY_CARE_PROVIDER_SITE_OTHER): Payer: Medicare Other | Admitting: Internal Medicine

## 2014-05-15 VITALS — BP 120/64 | HR 66 | Ht 65.0 in | Wt 138.0 lb

## 2014-05-15 DIAGNOSIS — J4521 Mild intermittent asthma with (acute) exacerbation: Secondary | ICD-10-CM | POA: Diagnosis not present

## 2014-05-15 MED ORDER — AZITHROMYCIN 250 MG PO TABS: ORAL_TABLET | ORAL | Status: DC

## 2014-05-15 MED ORDER — PREDNISONE 10 MG PO TABS: ORAL_TABLET | ORAL | Status: DC

## 2014-05-15 MED ORDER — PROMETHAZINE-CODEINE 6.25-10 MG/5ML PO SYRP: 2.5000 mL | ORAL_SOLUTION | Freq: Four times a day (QID) | ORAL | Status: DC | PRN

## 2014-05-15 NOTE — Patient Instructions (Signed)
Script printed for zithromax antibiotic. This will interact some with your warfarin.  Script  For prednisone taper and for cough syrup

## 2014-05-15 NOTE — Progress Notes (Signed)
08/24/10- Dr Duaine Dredge of Dr [facility] for chronic cough and PNDS. Has also been evaluated for a RUL nodule in past which is now deemed to be benign. Seen as add-on 6/13 with persistent to worsening cough, particularly in the past couple of weeks. Had 2 episodes of scant hemoptysis on 6/12. No increase in SOB. Denies CP or pleurodynia. Denies F/C/S, LE edema or calf tenderness. On warfarin for CAF. PT last checked approx 3 wks ago and has been in therapeutic range consistently. Denies overt epistaxis but thinks blood might have come from nose.  04/14/11- 30 yoF former smoker  Newly referred to [facility] by  [facility] with complaint of trouble breathing when exercising. Husband is here. Over the last year she's been more aware of dyspnea with exertion. She is comfortable at rest. The sensation has been constant without any original event recognized, variation from day-to-day or progression. This is noticed mainly if she is walking up an incline, taking stairs quickly without stopping or otherwise exercising a little harder than usual ADLs. She was seen here in June as noted above will followup of her chronic cough problems which had been present for years. Cough has been mostly dry with occasional wheeze. Heavy dust exposure may be a trigger. Atrial fibrillation diagnosed about 2007, no pacemaker/Dr. Acie Fredrickson. Denies anemia.  Denies history of pneumonia or TB exposure. PPD negative in the past. She worked in the past/[place] with possibility of histoplasma exposure but no rebound liver is. No diagnosis of sarcoid. Soc- Married housewife, quit cigs 1992. CXR- 04/14/11- images reviewed: IMPRESSION:  No acute cardiopulmonary process. Central vascular congestion  without overt edema.  Evidence of prior granulomatous disease.  Original Report Authenticated By: Arline Asp, M.D.   05/12/11- 20 yoF former smoker followed for dyspnea on exertion, complicated by hx lung nodule, GERD, AFib,  HBP, TIA. Little cough or phlegm. Still gets short of breath with exertion. Tolerated eye surgery. PFT: 04/20/2011-mild obstructive airways disease with insignificant response to bronchodilator, air trapping, diffusion mildly reduced. FEV1/FVC 0.63, DLCO 76%. 6 minute walk test-97%, 96%, 98%, 513 m. Well-maintained oxygenation.  06/26/11- 54 yoF former smoker followed for dyspnea on exertion, complicated by hx lung nodule, GERD, AFib, HBP, TIA. Acute visit-cough-productive at times-yellow; coughed up blood on Friday-not a lot. Husband here. Tudorza sample made her cough worse.  We called in an antibiotic which is helping acute bronchitis. Producing less phlegm. Codeine cough syrup is some help.  08/10/11- 89 yoF former smoker followed for dyspnea on exertion, complicated by hx lung nodule, GERD, AFib, HBP, TIA.    Dr Melford Aase PCP    Husband here Reports doing much better. Used up sample [facility]- says no longer needed. Benzonatate caused hallucination. We discussed CXR as reviewed w/ her- suggested vascular congestion, so improvement may have started with that. She no longer feels palpitation and denies orthopnea or swelling.   CXR 04/28/11 IMPRESSION:  No acute cardiopulmonary process. Central vascular congestion  without overt edema.  Evidence of prior granulomatous disease.  Original Report Authenticated By: Arline Asp, M.D.    02/05/12- 5 yoF former smoker followed for dyspnea on exertion, asthma/ bronchitis, complicated by hx lung nodule, GERD, AFib, HBP, TIA.    Dr Melford Aase PCP    Husband here Follows For:SOB about the same - Occas non prod cough - Occas wheezing - Denies chest tightness Cough most days is nonproductive. Some dyspnea on exertion, not worse. Plans hip replacement in January and the arthritis limits  her activity. Denies chest pain, palpitation, purulent or bloody sputum.  04/19/12 Acute OV  Complains of pt c/o increased SOB with activity and wheezing. Pt states these  symptoms are worse in the mornings and bedtime. . Symptoms have been worse x 10 days.  Pt also c/o increased swelling in feet and ankles.  Complains of DOE. Bilateral leg swelling . Weight is up 15lbs.  Denies chest pain , orthopnea, fever , discolored mucus or fever. No calf pain  Had elective left hip replacement 04/03/12 . Has had good recovery w/ rehab at home.  Has hx of Atrial Fib on chronic coumadin .  INR therapeutic last week at 2.2 .  CXR shows bilateral small pleural effusions.   01/27/13- 81 yoF former smoker followed for dyspnea on exertion, asthma/ bronchitis, complicated by hx lung nodule, GERD, AFib, HBP, TIA. FOLLOWS FOR:  Breathing unchanged since last OV. No concerns today Feet no longer swell CT chest 04/20/12 IMPRESSION:  1. No evidence of acute pulmonary embolism.  2. Small pleural effusions, cardiomegaly, and slightly prominent  interstitial markings may indicate very mild interstitial edema.  3. Calcified mediastinal and hilar nodes with calcified right  upper lobe granuloma consistent with prior granulomatous disease.  Original Report Authenticated By: Ivar Drape, M.D  02/12/14- 3 yoF former smoker followed for dyspnea on exertion, asthma/ bronchitis, complicated by hx lung nodule, GERD, AFib, HBP, TIA, glaucoma.   Husband here  FOLLOWS FOR: recently getting over a cold. Did not go to PCP or call here. Says she has gotten over recent acute bronchitis from a cold and is well now. Admits mild baseline cough with scant white sputum.  05/15/14- 83 yoF former smoker followed for dyspnea on exertion, asthma/ bronchitis, complicated by hx lung nodule, GERD, AFib, HBP, TIA, glaucoma.   Husband here  FOLLOWS FOR: Pt c/o cough with yellow mucus for past 2 weeks. She just finished a round of Amoxicillin. She saw some blood this am. Pt has some wheezing, she denies sob but has some chest discomfort with cough.. Persistent cough for 3 weeks beginning as a chest cold. She is finished  amoxicillin. Sputum remains yellow. Small streak of blood with hard coughing recently (on warfarin). No fever. CXR 02/12/14 IMPRESSION: Changes of COPD and old granulomatous disease. Enlargement of cardiac silhouette. No acute infiltrate. Electronically Signed  By: Lavonia Dana M.D.  On: 02/12/2014 18:50   ROS-see HPI Constitutional:   No-   weight loss, night sweats, fevers, chills, fatigue, lassitude. HEENT:   No-  headaches, difficulty swallowing, tooth/dental problems, sore throat,       No-  sneezing, itching, ear ache, nasal congestion, post nasal drip,  CV:  No-   chest pain, orthopnea, PND, swelling in lower extremities, anasarca,  dizziness, palpitations Resp: No-   shortness of breath with exertion or at rest.             +  productive cough, +  non-productive cough,  No- coughing up of blood.              +  change in color of mucus.  No- wheezing.   Skin: No-   rash or lesions. GI:  No-   heartburn, indigestion, abdominal pain, nausea, vomiting, GU: . MS:  No-   joint pain or swelling.   Neuro-     nothing unusual Psych:  No- change in mood or affect. No depression or anxiety.  No memory loss.  OBJ- Physical Exam General- Alert, Oriented, Affect-appropriate, Distress-  none acute Skin- rash-none, lesions- none, excoriation- none Lymphadenopathy- none Head- atraumatic            Eyes- Gross vision intact, PERRLA, conjunctivae and secretions clear            Ears- Hearing, canals-normal            Nose- Clear, no-Septal dev, mucus, polyps, erosion, perforation             Throat- Mallampati II , mucosa clear , drainage- none, tonsils- atrophic Neck- flexible , trachea midline, no stridor , thyroid nl, carotid no bruit Chest - symmetrical excursion , unlabored           Heart/CV- IRR , no murmur , no gallop  , no rub, nl s1 s2                           - JVD- none , edema- none, stasis changes- none, varices- none           Lung- clear to P&A, wheezy cough ,  dullness-none, rub- none           Chest wall-  Abd-  Br/ Gen/ Rectal- Not done, not indicated Extrem- cyanosis- none, clubbing, none, atrophy- none, strength- nl Neuro- grossly intact to observation

## 2014-05-15 NOTE — Assessment & Plan Note (Addendum)
Acute exacerbation of bronchitis. We discussed choice of a second trial antibiotic and the drug interactions particularly with warfarin. Plan-Z-Pak. Notify your Coumadin clinic that she will start taking this antibiotic at next lab check. Prednisone taper

## 2014-05-18 ENCOUNTER — Ambulatory Visit (INDEPENDENT_AMBULATORY_CARE_PROVIDER_SITE_OTHER): Payer: Medicare Other | Admitting: Pharmacist

## 2014-05-18 DIAGNOSIS — Z5181 Encounter for therapeutic drug level monitoring: Secondary | ICD-10-CM | POA: Diagnosis not present

## 2014-05-18 DIAGNOSIS — I4891 Unspecified atrial fibrillation: Secondary | ICD-10-CM

## 2014-05-18 LAB — POCT INR: INR: 4.4

## 2014-06-01 ENCOUNTER — Ambulatory Visit (INDEPENDENT_AMBULATORY_CARE_PROVIDER_SITE_OTHER): Payer: Medicare Other | Admitting: *Deleted

## 2014-06-01 DIAGNOSIS — Z5181 Encounter for therapeutic drug level monitoring: Secondary | ICD-10-CM | POA: Diagnosis not present

## 2014-06-01 DIAGNOSIS — I4891 Unspecified atrial fibrillation: Secondary | ICD-10-CM | POA: Diagnosis not present

## 2014-06-01 LAB — POCT INR: INR: 3.2

## 2014-06-15 ENCOUNTER — Ambulatory Visit (INDEPENDENT_AMBULATORY_CARE_PROVIDER_SITE_OTHER): Payer: Medicare Other | Admitting: *Deleted

## 2014-06-15 DIAGNOSIS — I4891 Unspecified atrial fibrillation: Secondary | ICD-10-CM

## 2014-06-15 DIAGNOSIS — Z5181 Encounter for therapeutic drug level monitoring: Secondary | ICD-10-CM

## 2014-06-15 LAB — POCT INR: INR: 2.7

## 2014-07-13 ENCOUNTER — Ambulatory Visit (INDEPENDENT_AMBULATORY_CARE_PROVIDER_SITE_OTHER): Payer: Medicare Other | Admitting: *Deleted

## 2014-07-13 DIAGNOSIS — Z5181 Encounter for therapeutic drug level monitoring: Secondary | ICD-10-CM | POA: Diagnosis not present

## 2014-07-13 DIAGNOSIS — I4891 Unspecified atrial fibrillation: Secondary | ICD-10-CM

## 2014-07-13 LAB — POCT INR: INR: 2.2

## 2014-08-11 ENCOUNTER — Ambulatory Visit (INDEPENDENT_AMBULATORY_CARE_PROVIDER_SITE_OTHER): Payer: Medicare Other

## 2014-08-11 DIAGNOSIS — I4891 Unspecified atrial fibrillation: Secondary | ICD-10-CM | POA: Diagnosis not present

## 2014-08-11 DIAGNOSIS — Z5181 Encounter for therapeutic drug level monitoring: Secondary | ICD-10-CM

## 2014-08-11 LAB — POCT INR: INR: 1.9

## 2014-09-07 ENCOUNTER — Ambulatory Visit (INDEPENDENT_AMBULATORY_CARE_PROVIDER_SITE_OTHER): Payer: Medicare Other | Admitting: *Deleted

## 2014-09-07 ENCOUNTER — Encounter: Payer: Self-pay | Admitting: Cardiovascular Disease

## 2014-09-07 ENCOUNTER — Ambulatory Visit (INDEPENDENT_AMBULATORY_CARE_PROVIDER_SITE_OTHER): Payer: Medicare Other | Admitting: Cardiovascular Disease

## 2014-09-07 VITALS — BP 144/72 | HR 54 | Ht 65.0 in | Wt 138.0 lb

## 2014-09-07 DIAGNOSIS — Z5181 Encounter for therapeutic drug level monitoring: Secondary | ICD-10-CM

## 2014-09-07 DIAGNOSIS — I4891 Unspecified atrial fibrillation: Secondary | ICD-10-CM | POA: Diagnosis not present

## 2014-09-07 DIAGNOSIS — E785 Hyperlipidemia, unspecified: Secondary | ICD-10-CM | POA: Diagnosis not present

## 2014-09-07 LAB — POCT INR: INR: 2.5

## 2014-09-07 NOTE — Progress Notes (Signed)
Natalie Ho  Date of Birth  June 19, 1930 [facility] [address]    [place], [place]  [address] [phone]  Fax  [phone]  Problems: 1. Atrial fibrillation 2. Remote stroke 3. Aortic insufficiency 4. Hypertension 5. Hypercholesterolemia  History of Present Illness:  81 yo Hx of a-Fib, remote stroke, HTN, hypercholesterolemia.  Her last visit we added amlodipine 2.5 mg a day. She also started taking her Benicar 20 mg twice a day instead of 40 mg at once. She is feeling quite a bit better. Her blood pressure readings have stabilized. She still occasionally has some signs of mild hypertension with a systolic blood pressure 280.      She also has episodes where her blood pressure will become fairly low especially at night. They're given some nights when she has held her medication for several hours because her blood pressure was in the 90 range.  She's not been limited by chest pain or shortness of breath.  She has been having some problems with hypertension.  We added amlodipine 2.5 mg at her last visit.  Her blood pressures have been better.    Jul 11, 2012:  She has done well after her left hip replacement.   She has some dyspnea but is overall doing well.   She has stopped her Benicar and is feeling much better.  She was having low BP readings.   September 07, 2014   [name] is doing well .  No CP .  Mild dyspnea with exercise.    Current Outpatient Prescriptions on File Prior to Visit  Medication Sig Dispense Refill  . aspirin EC 81 MG tablet Take 81 mg by mouth daily.    Marland Kitchen atorvastatin (LIPITOR) 20 MG tablet Take 1 tablet (20 mg total) by mouth at bedtime. 90 tablet 3  . calcium citrate (CALCITRATE - DOSED IN MG ELEMENTAL CALCIUM) 950 MG tablet Take 1 tablet by mouth daily.    . cholecalciferol (VITAMIN D) 1000 UNITS tablet Take 1,000 Units by mouth daily.    . digoxin (LANOXIN) 0.125 MG tablet Take 1 tablet (125 mcg total) by mouth every morning. 90  tablet 3  . nebivolol (BYSTOLIC) 5 MG tablet Take 1 tablet (5 mg total) by mouth every morning. 90 tablet 3  . nystatin-triamcinolone (MYCOLOG II) cream Apply 1 application topically daily as needed (fungus). On fungus    . promethazine-codeine (PHENERGAN WITH CODEINE) 6.25-10 MG/5ML syrup Take 2.5-5 mLs by mouth every 6 (six) hours as needed for cough. 140 mL 0  . sertraline (ZOLOFT) 50 MG tablet Take 50 mg by mouth every morning.     Marland Kitchen SIMBRINZA 1-0.2 % SUSP Place 1 drop into both eyes 2 (two) times daily.  0  . warfarin (COUMADIN) 5 MG tablet Take as directed by anticoagulation clinic 30 tablet 3   No current facility-administered medications on file prior to visit.    Allergies  Allergen Reactions  . Benzonatate Other (See Comments)    Unknown reaction  . Shrimp [Shellfish Allergy] Anaphylaxis and Swelling  . Alendronate Sodium Other (See Comments)    Eyes and head hurt  . Dabigatran Etexilate Mesylate Other (See Comments)    Upset stomach"Pradaxa"  . Levofloxacin Other (See Comments)    "made her funny in the head"  . Pradaxa [Dabigatran Etexilate Mesylate] Other (See Comments)    "sick to my stomach and lost weight"  . Sulfonamide Derivatives Other (See Comments)    Unknown reaction  .  Tramadol Other (See Comments)    "made me feel funny in my head"    Past Medical History  Diagnosis Date  . Atrial fibrillation   . Aortic insufficiency   . Hypertension   . Hyperlipidemia   . SOB (shortness of breath)   . Gout   . GERD (gastroesophageal reflux disease)   . Colitis   . Arthritis   . Anxiety   . Chronic anticoagulation   . Melanoma of nose     "was only a precancer area"-no problems since  . TIA (transient ischemic attack)   . Stroke      2007 and  TIA-3'08, none recent  . Glaucoma     both eyes  . Tinnitus of both ears     Past Surgical History  Procedure Laterality Date  . Cataracts      bilateral  . Fracture surgery      ORIF-Lt. hip -,removed hardware    . Eye surgeries      Multiple eye surgeries for torned retina-bilateral  . Cardioversion      Unsuccessful  . Blepharoplasty      bilateral  . Colon polyps      removed via colonoscopy  . Tonsillectomy    . Total hip arthroplasty  04/03/2012    Procedure: TOTAL HIP ARTHROPLASTY ANTERIOR APPROACH;  Surgeon: Gearlean Alf, MD;  Location: [facility];  Service: Orthopedics;  Laterality: Left;  . Cholecystectomy N/A 02/26/2013    Procedure: LAPAROSCOPIC CHOLECYSTECTOMY WITH INTRAOPERATIVE CHOLANGIOGRAM;  Surgeon: Edward Jolly, MD;  Location: [facility];  Service: General;  Laterality: N/A;    History  Smoking status  . Former Smoker -- 0.50 packs/day for 25 years  . Types: Cigarettes  . Quit date: 03/13/1990  Smokeless tobacco  . Never Used    History  Alcohol Use  . 2.5 oz/week  . 5 drink(s) per week    Comment: wine daily    Family History  Problem Relation Age of Onset  . Heart disease Mother     Reviw of Systems:  Reviewed in the HPI.  All other systems are negative.  Physical Exam: BP 144/72 mmHg  Pulse 54  Ht 5\' 5"  (1.651 m)  Wt 62.596 kg (138 lb)  BMI 22.96 kg/m2 The patient is alert and oriented x 3.  The mood and affect are normal.   Skin: warm and dry.  Color is normal.    HEENT:   the sclera are nonicteric.  The mucous membranes are moist.  The carotids are 2+ without bruits.  There is no thyromegaly.  There is no JVD.    Lungs: clear.  The chest wall is non tender.    Heart: Irregularly irregular normal S1-S2.Marland Kitchen  There are no murmurs, gallops, or rubs. The PMI is not displaced.     Abdomen: good bowel sounds.  There is no guarding or rebound.  There is no hepatosplenomegaly or tenderness.  There are no masses.   Extremities:  no clubbing, cyanosis, or edema.  The legs are without rashes.  The distal pulses are intact.   Neuro:  Cranial nerves II - XII are intact.  Motor and sensory functions are intact.    The gait is normal.  ECG:   September 07, 2014:  Atrial fib with ventricular rate of 54. Marland Kitchen  Poor R wave progression .   Assessment / Plan:   1. Atrial fibrillation - stable, INR levels are stable   2. Remote stroke 3. Aortic  insufficiency 4. Hypertension  5. Hypercholesterolemia - followed by Dr. Shaune Pollack, Wonda Cheng, MD  09/07/2014 10:20 AM    [place],  [place], [place]  [address] Pager [phone] Phone: [phone]; Fax: [phone]   [facility]  [address] [place], [place]  [address] [phone]    Fax [phone]

## 2014-09-07 NOTE — Patient Instructions (Signed)
Medication Instructions:  STOP Digoxin  Labwork: None Ordered   Testing/Procedures: None Ordered   Follow-Up: Your physician wants you to follow-up in: 1 year with Dr. Acie Fredrickson.  You will receive a reminder letter in the mail two months in advance. If you don't receive a letter, please call our office to schedule the follow-up appointment.

## 2014-09-10 ENCOUNTER — Telehealth: Payer: Self-pay | Admitting: Cardiovascular Disease

## 2014-09-10 ENCOUNTER — Other Ambulatory Visit: Payer: Self-pay | Admitting: *Deleted

## 2014-09-10 ENCOUNTER — Other Ambulatory Visit: Payer: Self-pay | Admitting: Family Medicine

## 2014-09-10 DIAGNOSIS — E559 Vitamin D deficiency, unspecified: Secondary | ICD-10-CM

## 2014-09-10 MED ORDER — NEBIVOLOL HCL 5 MG PO TABS: 5.0000 mg | ORAL_TABLET | Freq: Every morning | ORAL | Status: DC

## 2014-09-10 MED ORDER — WARFARIN SODIUM 5 MG PO TABS: ORAL_TABLET | ORAL | Status: DC

## 2014-09-10 NOTE — Telephone Encounter (Signed)
Called patient back in reference to using Voltaren Gel.  According to husband she is outside working in the yard and he would give her the message. Advised that it is okay to use the Voltaren Gel with taking Coumadin/Wafarin. He stated she will start using it today now that it is all right to do so. Advised to call back with any other issues, he verbalized understanding.

## 2014-09-10 NOTE — Telephone Encounter (Signed)
If Home Health RN is calling please get Coumadin Nurse on the phone STAT  1.  Are you calling in regards to an appointment? No  2.  Are you calling for a refill ? No  3.  Are you having bleeding issues? No  4.  Do you need clearance to hold Coumadin? No    Pt calling stating that she wanted to inform the coumadin clinic that she has been prescribed Voltarengel Diclofenac sodium 1% gel. Please call pt back.

## 2014-09-11 ENCOUNTER — Other Ambulatory Visit: Payer: Self-pay

## 2014-09-22 ENCOUNTER — Telehealth: Payer: Self-pay | Admitting: Cardiovascular Disease

## 2014-09-22 NOTE — Telephone Encounter (Signed)
Called spoke with pt, pt states she bumped leg and has large bruise.  Seems to be lightening up in color and now gravity is pulling blood down leg.  Bruise does not appear to be increasing in size.  Advised pt to continue to monitor.  Offered appt for INR check today to see if blood was too thin.  Pt's last INR was 2.5 on 09/07/14.  Pt states she has had a bruise like this before which resolved on it own after a period of time.  Pt WCB for sooner appt if bruise seems to increase in size, otherwise will keep scheduled appt on 10/05/14.

## 2014-09-22 NOTE — Telephone Encounter (Signed)
New message      Pt want coumadin clinic to know she has a big bruise on her leg.  Should her coumadin level be adjusted?

## 2014-09-25 ENCOUNTER — Ambulatory Visit
Admission: RE | Admit: 2014-09-25 | Discharge: 2014-09-25 | Disposition: A | Discharge status: Home / Self Care | Payer: Medicare Other | Source: Ambulatory Visit | Attending: Family Medicine | Admitting: Family Medicine

## 2014-09-25 DIAGNOSIS — E559 Vitamin D deficiency, unspecified: Secondary | ICD-10-CM

## 2014-10-05 ENCOUNTER — Ambulatory Visit (INDEPENDENT_AMBULATORY_CARE_PROVIDER_SITE_OTHER): Payer: Medicare Other | Admitting: *Deleted

## 2014-10-05 DIAGNOSIS — Z5181 Encounter for therapeutic drug level monitoring: Secondary | ICD-10-CM

## 2014-10-05 DIAGNOSIS — I4891 Unspecified atrial fibrillation: Secondary | ICD-10-CM | POA: Diagnosis not present

## 2014-10-05 LAB — POCT INR: INR: 2.4

## 2014-11-02 ENCOUNTER — Ambulatory Visit (INDEPENDENT_AMBULATORY_CARE_PROVIDER_SITE_OTHER): Payer: Medicare Other | Admitting: *Deleted

## 2014-11-02 DIAGNOSIS — Z5181 Encounter for therapeutic drug level monitoring: Secondary | ICD-10-CM | POA: Diagnosis not present

## 2014-11-02 DIAGNOSIS — I4891 Unspecified atrial fibrillation: Secondary | ICD-10-CM

## 2014-11-02 LAB — POCT INR: INR: 1.7

## 2014-11-17 ENCOUNTER — Ambulatory Visit (INDEPENDENT_AMBULATORY_CARE_PROVIDER_SITE_OTHER): Payer: Medicare Other

## 2014-11-17 DIAGNOSIS — Z5181 Encounter for therapeutic drug level monitoring: Secondary | ICD-10-CM | POA: Diagnosis not present

## 2014-11-17 DIAGNOSIS — I4891 Unspecified atrial fibrillation: Secondary | ICD-10-CM | POA: Diagnosis not present

## 2014-11-17 LAB — POCT INR: INR: 2.2

## 2014-11-27 ENCOUNTER — Telehealth: Payer: Self-pay | Admitting: Internal Medicine

## 2014-11-27 MED ORDER — PREDNISONE 10 MG PO TABS: ORAL_TABLET | ORAL | Status: DC

## 2014-11-27 NOTE — Telephone Encounter (Signed)
Pt returned call - [phone]

## 2014-11-27 NOTE — Telephone Encounter (Signed)
Called spoke with pt. She c/o wheezing, increase SOB w/ exertion x couple days. She was offered 9:30 appt when she called but refused. Pt requesting recs. Please advise Dr. Annamaria Boots thanks  Allergies  Allergen Reactions  . Benzonatate Other (See Comments)    Unknown reaction  . Shrimp [Shellfish Allergy] Anaphylaxis and Swelling  . Alendronate Sodium Other (See Comments)    Eyes and head hurt  . Dabigatran Etexilate Mesylate Other (See Comments)    Upset stomach"Pradaxa"  . Levofloxacin Other (See Comments)    "made her funny in the head"  . Pradaxa [Dabigatran Etexilate Mesylate] Other (See Comments)    "sick to my stomach and lost weight"  . Sulfonamide Derivatives Other (See Comments)    Unknown reaction  . Tramadol Other (See Comments)    "made me feel funny in my head"     Current Outpatient Prescriptions on File Prior to Visit  Medication Sig Dispense Refill  . aspirin EC 81 MG tablet Take 81 mg by mouth daily.    Marland Kitchen atorvastatin (LIPITOR) 20 MG tablet Take 1 tablet (20 mg total) by mouth at bedtime. 90 tablet 3  . calcium citrate (CALCITRATE - DOSED IN MG ELEMENTAL CALCIUM) 950 MG tablet Take 1 tablet by mouth daily.    . cholecalciferol (VITAMIN D) 1000 UNITS tablet Take 1,000 Units by mouth daily.    . nebivolol (BYSTOLIC) 5 MG tablet Take 1 tablet (5 mg total) by mouth every morning. 90 tablet 3  . nystatin-triamcinolone (MYCOLOG II) cream Apply 1 application topically daily as needed (fungus). On fungus    . promethazine-codeine (PHENERGAN WITH CODEINE) 6.25-10 MG/5ML syrup Take 2.5-5 mLs by mouth every 6 (six) hours as needed for cough. 140 mL 0  . sertraline (ZOLOFT) 50 MG tablet Take 50 mg by mouth every morning.     Marland Kitchen SIMBRINZA 1-0.2 % SUSP Place 1 drop into both eyes 2 (two) times daily.  0  . warfarin (COUMADIN) 5 MG tablet Take as directed by anticoagulation clinic 30 tablet 3   No current facility-administered medications on file prior to visit.

## 2014-11-27 NOTE — Telephone Encounter (Signed)
Pt aware of recs.  Nothing further needed. 

## 2014-11-27 NOTE — Telephone Encounter (Signed)
Offer prednisone 10 mg, # 5    2 today then one daily

## 2014-11-27 NOTE — Telephone Encounter (Signed)
lmomtcb x1 for pt 

## 2014-12-08 ENCOUNTER — Ambulatory Visit (INDEPENDENT_AMBULATORY_CARE_PROVIDER_SITE_OTHER): Payer: Medicare Other | Admitting: *Deleted

## 2014-12-08 DIAGNOSIS — Z5181 Encounter for therapeutic drug level monitoring: Secondary | ICD-10-CM

## 2014-12-08 DIAGNOSIS — I4891 Unspecified atrial fibrillation: Secondary | ICD-10-CM

## 2014-12-08 LAB — POCT INR: INR: 2.6

## 2015-01-04 IMAGING — CR DG HIP (WITH OR WITHOUT PELVIS) 2-3V*L*
3 series · 3 of 3 positions shown · non-contrast
Comparison: None

CLINICAL DATA: Preoperative assessment for left total hip
replacement

LEFT HIP - COMPLETE 2+ VIEW

[t pelvis a.p.]
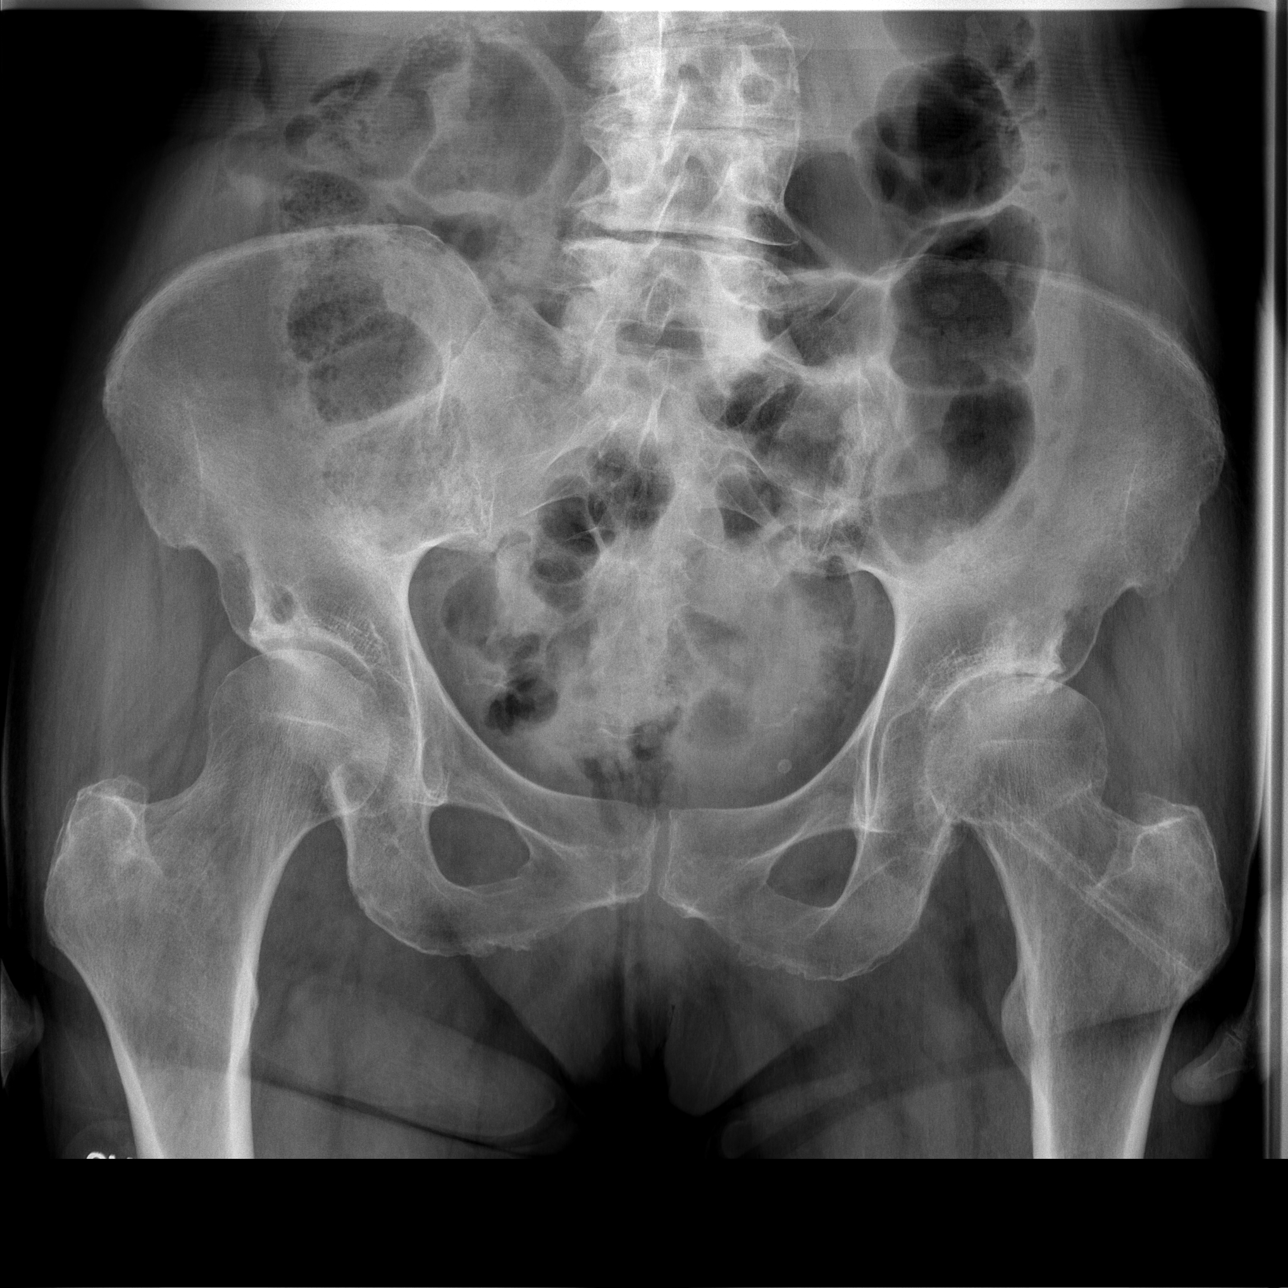

[t hip ap left]
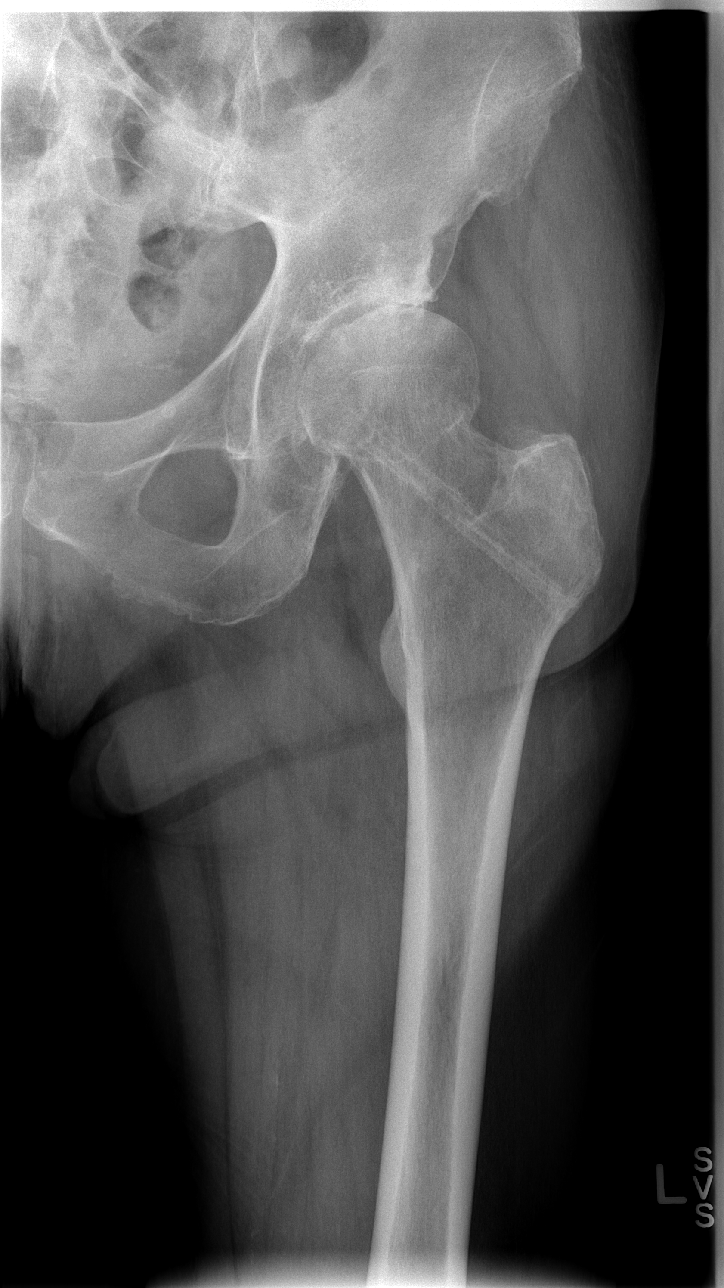

[t hip frog leg left]
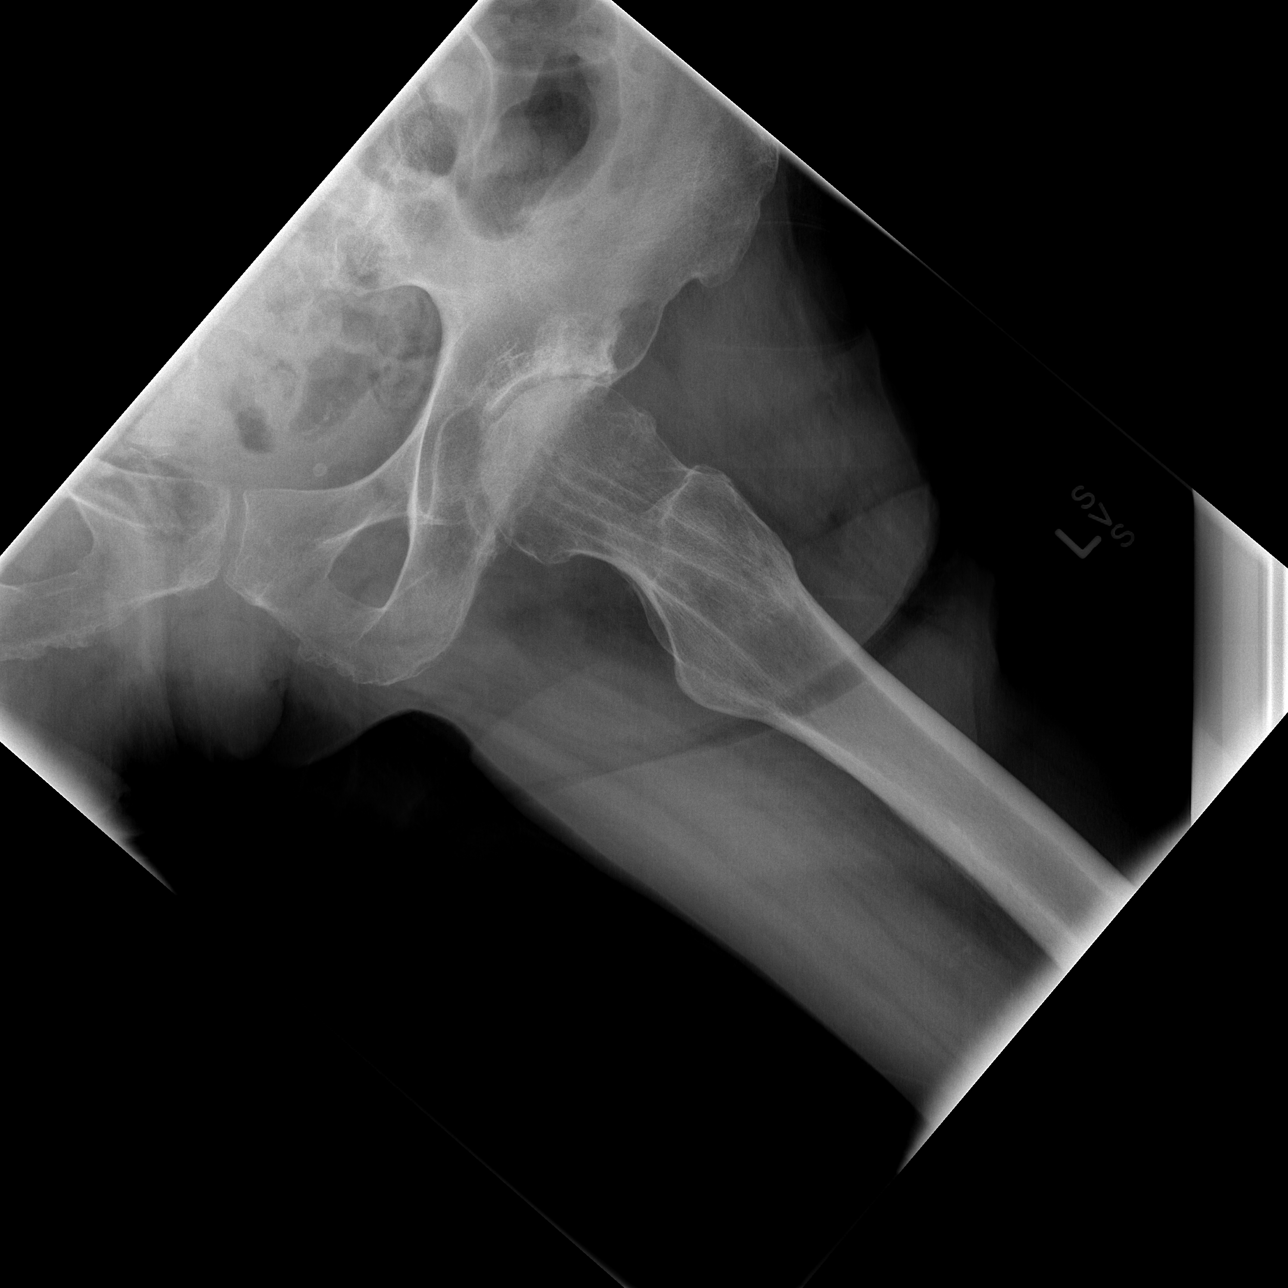

[3 of 3 positions shown; findings below may reference images not displayed]

FINDINGS: Osseous demineralization.
Osteoarthritic changes of left hip joint with joint space narrowing
and minimal subchondral sclerosis.
Old pin tracks versus prior core decompression tracts within
proximal left femur.
Minimal narrowing of right hip joint.
Degenerative disc disease changes lower lumbar spine.
No acute fracture, dislocation or bone destruction.
IMPRESSION: Osteoarthritic changes left hip joint.
Prior surgery proximal left femur.

## 2015-01-05 ENCOUNTER — Ambulatory Visit (INDEPENDENT_AMBULATORY_CARE_PROVIDER_SITE_OTHER): Payer: Medicare Other | Admitting: *Deleted

## 2015-01-05 DIAGNOSIS — Z5181 Encounter for therapeutic drug level monitoring: Secondary | ICD-10-CM | POA: Diagnosis not present

## 2015-01-05 DIAGNOSIS — I4891 Unspecified atrial fibrillation: Secondary | ICD-10-CM | POA: Diagnosis not present

## 2015-01-05 LAB — POCT INR: INR: 2.4

## 2015-02-02 ENCOUNTER — Ambulatory Visit (INDEPENDENT_AMBULATORY_CARE_PROVIDER_SITE_OTHER): Payer: Medicare Other | Admitting: Pharmacist

## 2015-02-02 DIAGNOSIS — Z5181 Encounter for therapeutic drug level monitoring: Secondary | ICD-10-CM | POA: Diagnosis not present

## 2015-02-02 DIAGNOSIS — I4891 Unspecified atrial fibrillation: Secondary | ICD-10-CM

## 2015-02-02 LAB — POCT INR: INR: 2.1

## 2015-02-08 ENCOUNTER — Other Ambulatory Visit: Payer: Self-pay | Admitting: *Deleted

## 2015-02-08 MED ORDER — WARFARIN SODIUM 5 MG PO TABS: ORAL_TABLET | ORAL | Status: DC

## 2015-02-15 ENCOUNTER — Ambulatory Visit: Payer: Medicare Other | Admitting: Internal Medicine

## 2015-02-23 ENCOUNTER — Ambulatory Visit: Payer: Medicare Other | Admitting: Internal Medicine

## 2015-03-16 ENCOUNTER — Ambulatory Visit (INDEPENDENT_AMBULATORY_CARE_PROVIDER_SITE_OTHER): Payer: Medicare Other | Admitting: *Deleted

## 2015-03-16 DIAGNOSIS — I4891 Unspecified atrial fibrillation: Secondary | ICD-10-CM

## 2015-03-16 DIAGNOSIS — Z5181 Encounter for therapeutic drug level monitoring: Secondary | ICD-10-CM | POA: Diagnosis not present

## 2015-03-16 LAB — POCT INR: INR: 1.6

## 2015-03-23 ENCOUNTER — Encounter: Payer: Self-pay | Admitting: Internal Medicine

## 2015-03-23 ENCOUNTER — Ambulatory Visit (INDEPENDENT_AMBULATORY_CARE_PROVIDER_SITE_OTHER)
Admission: RE | Admit: 2015-03-23 | Discharge: 2015-03-23 | Disposition: A | Discharge status: Home / Self Care | Payer: Medicare Other | Source: Ambulatory Visit | Attending: Internal Medicine | Admitting: Internal Medicine

## 2015-03-23 ENCOUNTER — Ambulatory Visit (INDEPENDENT_AMBULATORY_CARE_PROVIDER_SITE_OTHER): Payer: Medicare Other | Admitting: Internal Medicine

## 2015-03-23 VITALS — BP 122/70 | HR 78 | Ht 65.0 in | Wt 135.2 lb

## 2015-03-23 DIAGNOSIS — J449 Chronic obstructive pulmonary disease, unspecified: Secondary | ICD-10-CM

## 2015-03-23 DIAGNOSIS — I97131 Postprocedural heart failure following other surgery: Secondary | ICD-10-CM

## 2015-03-23 DIAGNOSIS — I482 Chronic atrial fibrillation, unspecified: Secondary | ICD-10-CM

## 2015-03-23 DIAGNOSIS — R911 Solitary pulmonary nodule: Secondary | ICD-10-CM

## 2015-03-23 DIAGNOSIS — R0609 Other forms of dyspnea: Secondary | ICD-10-CM

## 2015-03-23 NOTE — Assessment & Plan Note (Signed)
Old granuloma. Former smoker with COPD so we will continue chest x-ray surveillance Plan-chest x-ray

## 2015-03-23 NOTE — Assessment & Plan Note (Signed)
Stable moderate COPD without enough reactive airways disease to need the expense of bronchodilators currently. Her atrial fibrillation and glaucoma would put some restraint on choices potentially.

## 2015-03-23 NOTE — Assessment & Plan Note (Signed)
Ventricular response rate well controlled. We do not feel she has enough reactive airways disease to challenge her heart rhythm with a stimulant bronchodilator

## 2015-03-23 NOTE — Assessment & Plan Note (Signed)
CXR has shown cardiomegaly but the exam does not indicate congestive heart failure at this visit

## 2015-03-23 NOTE — Patient Instructions (Addendum)
Order- CXR    Dx COPD mixed type  We will log in that you got the Prevnar 13 pneumonia vaccine Sept 1, 2016  Consider Biotene mouth rinse for dry mouth  Please call if we can help

## 2015-03-23 NOTE — Progress Notes (Signed)
08/24/10- Dr Duaine Dredge of Dr Lam[facility] Sakai for chronic cough and PNDS. Has also been evaluated for a RUL nodule in past which is now deemed to be benign. Seen as add-on 6/13 with persistent to worsening cough, particularly in the past couple of weeks. Had 2 episodes of scant hemoptysis on 6/12. No increase in SOB. Denies CP or pleurodynia. Denies F/C/S, LE edema or calf tenderness. On warfarin for CAF. PT last checked approx 3 wks ago and has been in therapeutic range consistently. Denies overt epistaxis but thinks blood might have come from nose.  04/14/11- 30 yoF former smoker  Newly referred to [facility] by  [facility] with complaint of trouble breathing when exercising. Husband is here. Over the last year she's been more aware of dyspnea with exertion. She is comfortable at rest. The sensation has been constant without any original event recognized, variation from day-to-day or progression. This is noticed mainly if she is walking up an incline, taking stairs quickly without stopping or otherwise exercising a little harder than usual ADLs. She was seen here in June as noted above will followup of her chronic cough problems which had been present for years. Cough has been mostly dry with occasional wheeze. Heavy dust exposure may be a trigger. Atrial fibrillation diagnosed about 2007, no pacemaker/Dr. Acie Fredrickson. Denies anemia.  Denies history of pneumonia or TB exposure. PPD negative in the past. She worked in the past/[place] with possibility of histoplasma exposure but no rebound liver is. No diagnosis of sarcoid. Soc- Married housewife, quit cigs 1992. CXR- 04/14/11- images reviewed: IMPRESSION:  No acute cardiopulmonary process. Central vascular congestion  without overt edema.  Evidence of prior granulomatous disease.  Original Report Authenticated By: Arline Asp, M.D.   05/12/11- 20 yoF former smoker followed for dyspnea on exertion, complicated by hx lung nodule, GERD, AFib,  HBP, TIA. Little cough or phlegm. Still gets short of breath with exertion. Tolerated eye surgery. PFT: 04/20/2011-mild obstructive airways disease with insignificant response to bronchodilator, air trapping, diffusion mildly reduced. FEV1/FVC 0.63, DLCO 76%. 6 minute walk test-97%, 96%, 98%, 513 m. Well-maintained oxygenation.  06/26/11- 54 yoF former smoker followed for dyspnea on exertion, complicated by hx lung nodule, GERD, AFib, HBP, TIA. Acute visit-cough-productive at times-yellow; coughed up blood on Friday-not a lot. Husband here. Tudorza sample made her cough worse.  We called in an antibiotic which is helping acute bronchitis. Producing less phlegm. Codeine cough syrup is some help.  08/10/11- 89 yoF former smoker followed for dyspnea on exertion, complicated by hx lung nodule, GERD, AFib, HBP, TIA.    Dr Melford Aase PCP    Husband here Reports doing much better. Used up sample [facility]- says no longer needed. Benzonatate caused hallucination. We discussed CXR as reviewed w/ her- suggested vascular congestion, so improvement may have started with that. She no longer feels palpitation and denies orthopnea or swelling.   CXR 04/28/11 IMPRESSION:  No acute cardiopulmonary process. Central vascular congestion  without overt edema.  Evidence of prior granulomatous disease.  Original Report Authenticated By: Arline Asp, M.D.    02/05/12- 5 yoF former smoker followed for dyspnea on exertion, asthma/ bronchitis, complicated by hx lung nodule, GERD, AFib, HBP, TIA.    Dr Melford Aase PCP    Husband here Follows For:SOB about the same - Occas non prod cough - Occas wheezing - Denies chest tightness Cough most days is nonproductive. Some dyspnea on exertion, not worse. Plans hip replacement in January and the arthritis limits  her activity. Denies chest pain, palpitation, purulent or bloody sputum.  04/19/12 Acute OV  Complains of pt c/o increased SOB with activity and wheezing. Pt states these  symptoms are worse in the mornings and bedtime. . Symptoms have been worse x 10 days.  Pt also c/o increased swelling in feet and ankles.  Complains of DOE. Bilateral leg swelling . Weight is up 15lbs.  Denies chest pain , orthopnea, fever , discolored mucus or fever. No calf pain  Had elective left hip replacement 04/03/12 . Has had good recovery w/ rehab at home.  Has hx of Atrial Fib on chronic coumadin .  INR therapeutic last week at 2.2 .  CXR shows bilateral small pleural effusions.   01/27/13- 59 yoF former smoker followed for dyspnea on exertion, asthma/ bronchitis, complicated by hx lung nodule, GERD, AFib, HBP, TIA. FOLLOWS FOR:  Breathing unchanged since last OV. No concerns today Feet no longer swell CT chest 04/20/12 IMPRESSION:  1. No evidence of acute pulmonary embolism.  2. Small pleural effusions, cardiomegaly, and slightly prominent  interstitial markings may indicate very mild interstitial edema.  3. Calcified mediastinal and hilar nodes with calcified right  upper lobe granuloma consistent with prior granulomatous disease.  Original Report Authenticated By: Ivar Drape, M.D  02/12/14- 77 yoF former smoker followed for dyspnea on exertion, asthma/ bronchitis, complicated by hx lung nodule, GERD, AFib, HBP, TIA, glaucoma.   Husband here  FOLLOWS FOR: recently getting over a cold. Did not go to PCP or call here. Says she has gotten over recent acute bronchitis from a cold and is well now. Admits mild baseline cough with scant white sputum.  05/15/14- 43 yoF former smoker followed for dyspnea on exertion, asthma/ bronchitis, complicated by hx lung nodule, GERD, AFib, HBP, TIA, glaucoma.   Husband here  FOLLOWS FOR: Pt c/o cough with yellow mucus for past 2 weeks. She just finished a round of Amoxicillin. She saw some blood this am. Pt has some wheezing, she denies sob but has some chest discomfort with cough.. Persistent cough for 3 weeks beginning as a chest cold. She is finished  amoxicillin. Sputum remains yellow. Small streak of blood with hard coughing recently (on warfarin). No fever. CXR 02/12/14 IMPRESSION: Changes of COPD and old granulomatous disease. Enlargement of cardiac silhouette. No acute infiltrate. Electronically Signed  By: Lavonia Dana M.D.  On: 02/12/2014 18:50  03/23/15-82 year old female former smoker followed for dyspnea on exertion, asthma/bronchitis, complicated by history lung nodule, GERD, A. fib, HBP, TIA, glaucoma    husband here FOLLOWS FOR: Pt states that breathing is "fair" compared to last visit. Still not at baseline. Increased SOB with activity. Little cough or wheeze. No real change in chronic dyspnea with exertion. She is not physically very active. Mouth breather at night, awaking with dry mouth Up-to-date on flu and pneumonia vaccines  ROS-see HPI Constitutional:   No-   weight loss, night sweats, fevers, chills, fatigue, lassitude. HEENT:   No-  headaches, difficulty swallowing, tooth/dental problems, sore throat,       No-  sneezing, itching, ear ache, nasal congestion, post nasal drip,  CV:  No-   chest pain, orthopnea, PND, swelling in lower extremities, anasarca,  dizziness, palpitations Resp: No-   shortness of breath with exertion or at rest.              productive cough,  non-productive cough,  No- coughing up of blood.  change in color of mucus.  No- wheezing.   Skin: No-   rash or lesions. GI:  No-   heartburn, indigestion, abdominal pain, nausea, vomiting, GU: . MS:  No-   joint pain or swelling.   Neuro-     nothing unusual Psych:  No- change in mood or affect. No depression or anxiety.  No memory loss.  OBJ- Physical Exam General- Alert, Oriented, Affect-appropriate, Distress- none acute Skin- rash-none, lesions- none, excoriation- none Lymphadenopathy- none Head- atraumatic            Eyes- Gross vision intact, PERRLA, conjunctivae and secretions clear            Ears- Hearing,  canals-normal            Nose- Clear, no-Septal dev, mucus, polyps, erosion, perforation             Throat- Mallampati II , mucosa clear , drainage- none, tonsils- atrophic Neck- flexible , trachea midline, no stridor , thyroid nl, carotid no bruit Chest - symmetrical excursion , unlabored           Heart/CV- IRR , no murmur , no gallop  , no rub, nl s1 s2                           - JVD- none , edema- none, stasis changes- none, varices- none           Lung- clear to P&A,  cough-none , dullness-none, rub- none           Chest wall-  Abd-  Br/ Gen/ Rectal- Not done, not indicated Extrem- cyanosis- none, clubbing, none, atrophy- none, strength- nl Neuro- grossly intact to observation

## 2015-03-23 NOTE — Progress Notes (Signed)
Quick Note:  Spoke with pt. And discussed CXR  Pt. Understood. Nothing further needed at this time. ______

## 2015-03-30 ENCOUNTER — Ambulatory Visit (INDEPENDENT_AMBULATORY_CARE_PROVIDER_SITE_OTHER): Payer: Medicare Other | Admitting: *Deleted

## 2015-03-30 DIAGNOSIS — Z5181 Encounter for therapeutic drug level monitoring: Secondary | ICD-10-CM

## 2015-03-30 DIAGNOSIS — I4891 Unspecified atrial fibrillation: Secondary | ICD-10-CM | POA: Diagnosis not present

## 2015-03-30 DIAGNOSIS — I482 Chronic atrial fibrillation, unspecified: Secondary | ICD-10-CM

## 2015-03-30 LAB — POCT INR: INR: 2.5

## 2015-04-20 ENCOUNTER — Ambulatory Visit (INDEPENDENT_AMBULATORY_CARE_PROVIDER_SITE_OTHER): Payer: Medicare Other | Admitting: *Deleted

## 2015-04-20 DIAGNOSIS — Z5181 Encounter for therapeutic drug level monitoring: Secondary | ICD-10-CM | POA: Diagnosis not present

## 2015-04-20 DIAGNOSIS — I4891 Unspecified atrial fibrillation: Secondary | ICD-10-CM | POA: Diagnosis not present

## 2015-04-20 DIAGNOSIS — I482 Chronic atrial fibrillation, unspecified: Secondary | ICD-10-CM

## 2015-04-20 LAB — POCT INR: INR: 3.4

## 2015-05-04 ENCOUNTER — Ambulatory Visit (INDEPENDENT_AMBULATORY_CARE_PROVIDER_SITE_OTHER): Payer: Medicare Other

## 2015-05-04 DIAGNOSIS — Z5181 Encounter for therapeutic drug level monitoring: Secondary | ICD-10-CM

## 2015-05-04 DIAGNOSIS — I482 Chronic atrial fibrillation, unspecified: Secondary | ICD-10-CM

## 2015-05-04 DIAGNOSIS — I4891 Unspecified atrial fibrillation: Secondary | ICD-10-CM | POA: Diagnosis not present

## 2015-05-04 LAB — POCT INR: INR: 1.8

## 2015-05-18 ENCOUNTER — Ambulatory Visit (INDEPENDENT_AMBULATORY_CARE_PROVIDER_SITE_OTHER): Payer: Medicare Other | Admitting: *Deleted

## 2015-05-18 DIAGNOSIS — Z5181 Encounter for therapeutic drug level monitoring: Secondary | ICD-10-CM

## 2015-05-18 DIAGNOSIS — I482 Chronic atrial fibrillation, unspecified: Secondary | ICD-10-CM

## 2015-05-18 DIAGNOSIS — I4891 Unspecified atrial fibrillation: Secondary | ICD-10-CM

## 2015-05-18 LAB — POCT INR: INR: 2.2

## 2015-06-08 ENCOUNTER — Ambulatory Visit (INDEPENDENT_AMBULATORY_CARE_PROVIDER_SITE_OTHER): Payer: Medicare Other | Admitting: *Deleted

## 2015-06-08 DIAGNOSIS — I482 Chronic atrial fibrillation, unspecified: Secondary | ICD-10-CM

## 2015-06-08 DIAGNOSIS — Z5181 Encounter for therapeutic drug level monitoring: Secondary | ICD-10-CM | POA: Diagnosis not present

## 2015-06-08 DIAGNOSIS — I4891 Unspecified atrial fibrillation: Secondary | ICD-10-CM

## 2015-06-08 LAB — POCT INR: INR: 2.2

## 2015-06-23 ENCOUNTER — Other Ambulatory Visit: Payer: Self-pay | Admitting: *Deleted

## 2015-06-24 MED ORDER — WARFARIN SODIUM 5 MG PO TABS
ORAL_TABLET | ORAL | Status: DC
Start: 2015-06-24 — End: 2015-07-28

## 2015-07-07 ENCOUNTER — Ambulatory Visit (INDEPENDENT_AMBULATORY_CARE_PROVIDER_SITE_OTHER): Payer: Medicare Other | Admitting: *Deleted

## 2015-07-07 DIAGNOSIS — I482 Chronic atrial fibrillation, unspecified: Secondary | ICD-10-CM

## 2015-07-07 DIAGNOSIS — Z5181 Encounter for therapeutic drug level monitoring: Secondary | ICD-10-CM

## 2015-07-07 DIAGNOSIS — I4891 Unspecified atrial fibrillation: Secondary | ICD-10-CM

## 2015-07-07 LAB — POCT INR: INR: 2.3

## 2015-07-28 ENCOUNTER — Ambulatory Visit (INDEPENDENT_AMBULATORY_CARE_PROVIDER_SITE_OTHER): Payer: Medicare Other | Admitting: Cardiovascular Disease

## 2015-07-28 ENCOUNTER — Encounter: Payer: Self-pay | Admitting: Cardiovascular Disease

## 2015-07-28 ENCOUNTER — Ambulatory Visit (INDEPENDENT_AMBULATORY_CARE_PROVIDER_SITE_OTHER): Payer: Medicare Other | Admitting: *Deleted

## 2015-07-28 VITALS — BP 130/82 | HR 67 | Ht 65.0 in | Wt 135.0 lb

## 2015-07-28 DIAGNOSIS — I482 Chronic atrial fibrillation, unspecified: Secondary | ICD-10-CM

## 2015-07-28 DIAGNOSIS — I4891 Unspecified atrial fibrillation: Secondary | ICD-10-CM

## 2015-07-28 DIAGNOSIS — I272 Other secondary pulmonary hypertension: Secondary | ICD-10-CM | POA: Diagnosis not present

## 2015-07-28 DIAGNOSIS — I10 Essential (primary) hypertension: Secondary | ICD-10-CM | POA: Diagnosis not present

## 2015-07-28 DIAGNOSIS — Z5181 Encounter for therapeutic drug level monitoring: Secondary | ICD-10-CM

## 2015-07-28 LAB — BASIC METABOLIC PANEL
BUN: 11 mg/dL (ref 7–25)
CALCIUM: 9.3 mg/dL (ref 8.6–10.4)
CO2: 29 mmol/L (ref 20–31)
CREATININE: 0.71 mg/dL (ref 0.60–0.88)
Chloride: 93 mmol/L — ABNORMAL LOW (ref 98–110)
GLUCOSE: 95 mg/dL (ref 65–99)
Potassium: 4.1 mmol/L (ref 3.5–5.3)
Sodium: 130 mmol/L — ABNORMAL LOW (ref 135–146)

## 2015-07-28 LAB — POCT INR: INR: 2.3

## 2015-07-28 MED ORDER — RIVAROXABAN 20 MG PO TABS: 20.0000 mg | ORAL_TABLET | Freq: Every day | ORAL | Status: DC

## 2015-07-28 NOTE — Progress Notes (Signed)
Natalie Ho  Date of Birth  27-Aug-1930 [facility] [address]    [place], [place]  [address] [phone]  Fax  [phone]  Problems: 1. Atrial fibrillation 2. Remote stroke 3. Aortic insufficiency 4. Hypertension 5. Hypercholesterolemia  History of Present Illness:  82 yo Hx of a-Fib, remote stroke, HTN, hypercholesterolemia.  Her last visit we added amlodipine 2.5 mg a day. She also started taking her Benicar 20 mg twice a day instead of 40 mg at once. She is feeling quite a bit better. Her blood pressure readings have stabilized. She still occasionally has some signs of mild hypertension with a systolic blood pressure [id number].      She also has episodes where her blood pressure will become fairly low especially at night. They're given some nights when she has held her medication for several hours because her blood pressure was in the 90 range.  She's not been limited by chest pain or shortness of breath.  She has been having some problems with hypertension.  We added amlodipine 2.5 mg at her last visit.  Her blood pressures have been better.    Jul 11, 2012:  She has done well after her left hip replacement.   She has some dyspnea but is overall doing well.   She has stopped her Benicar and is feeling much better.  She was having low BP readings.   September 07, 2014   Natalie Ho is doing well .  No CP .  Mild dyspnea with exercise.  Jul 28, 2015:  Has some DOE with mowing the grass and vacuuming . Echo in 2014 shows normal LV function. She did have moderate pulmonary HTN ( PA pressure of 46)  BP has been ok at home. A bit elevated here.  Has a chronic cough  Records from Dr. Melford Aase were reviewed.   Labs look great.    Current Outpatient Prescriptions on File Prior to Visit  Medication Sig Dispense Refill  . aspirin EC 81 MG tablet Take 81 mg by mouth daily.    Marland Kitchen atorvastatin (LIPITOR) 20 MG tablet Take 1 tablet (20 mg total) by mouth at bedtime. 90  tablet 3  . calcium citrate (CALCITRATE - DOSED IN MG ELEMENTAL CALCIUM) 950 MG tablet Take 1 tablet by mouth daily.    . cholecalciferol (VITAMIN D) 1000 UNITS tablet Take 1,000 Units by mouth daily.    . nebivolol (BYSTOLIC) 5 MG tablet Take 1 tablet (5 mg total) by mouth every morning. 90 tablet 3  . nystatin-triamcinolone (MYCOLOG II) cream Apply 1 application topically daily as needed (fungus). On fungus    . sertraline (ZOLOFT) 50 MG tablet Take 50 mg by mouth every morning.     Marland Kitchen SIMBRINZA 1-0.2 % SUSP Place 1 drop into both eyes 2 (two) times daily.  0  . warfarin (COUMADIN) 5 MG tablet Take as directed by anticoagulation clinic 30 tablet 3   No current facility-administered medications on file prior to visit.    Allergies  Allergen Reactions  . Benzonatate Other (See Comments)    Unknown reaction  . Shrimp [Shellfish Allergy] Anaphylaxis and Swelling  . Alendronate Sodium Other (See Comments)    Eyes and head hurt  . Dabigatran Etexilate Mesylate Other (See Comments)    Upset stomach"Pradaxa"  . Levofloxacin Other (See Comments)    "made her funny in the head"  . Pradaxa [Dabigatran Etexilate Mesylate] Other (See Comments)    "sick to my stomach and  lost weight"  . Sulfonamide Derivatives Other (See Comments)    Unknown reaction  . Tramadol Other (See Comments)    "made me feel funny in my head"    Past Medical History  Diagnosis Date  . Atrial fibrillation ([place])   . Aortic insufficiency   . Hypertension   . Hyperlipidemia   . SOB (shortness of breath)   . Gout   . GERD (gastroesophageal reflux disease)   . Colitis   . Arthritis   . Anxiety   . Chronic anticoagulation   . Melanoma of nose ([place])     "was only a precancer area"-no problems since  . TIA (transient ischemic attack)   . Stroke [facility])      2007 and  TIA-3'08, none recent  . Glaucoma     both eyes  . Tinnitus of both ears     Past Surgical History  Procedure Laterality Date  . Cataracts       bilateral  . Fracture surgery      ORIF-Lt. hip -,removed hardware  . Eye surgeries      Multiple eye surgeries for torned retina-bilateral  . Cardioversion      Unsuccessful  . Blepharoplasty      bilateral  . Colon polyps      removed via colonoscopy  . Tonsillectomy    . Total hip arthroplasty  04/03/2012    Procedure: TOTAL HIP ARTHROPLASTY ANTERIOR APPROACH;  Surgeon: Gearlean Alf, MD;  Location: [facility];  Service: Orthopedics;  Laterality: Left;  . Cholecystectomy N/A 02/26/2013    Procedure: LAPAROSCOPIC CHOLECYSTECTOMY WITH INTRAOPERATIVE CHOLANGIOGRAM;  Surgeon: Edward Jolly, MD;  Location: [facility];  Service: General;  Laterality: N/A;    History  Smoking status  . Former Smoker -- 0.50 packs/day for 25 years  . Types: Cigarettes  . Quit date: 03/13/1990  Smokeless tobacco  . Never Used    History  Alcohol Use  . 2.5 oz/week  . 5 drink(s) per week    Comment: wine daily    Family History  Problem Relation Age of Onset  . Heart disease Mother     Reviw of Systems:  Reviewed in the HPI.  All other systems are negative.  Physical Exam: BP 180/100 mmHg  Pulse 67  Ht 5\' 5"  (1.651 m)  Wt 135 lb (61.236 kg)  BMI 22.47 kg/m2 The patient is alert and oriented x 3.  The mood and affect are normal.   Skin: warm and dry.  Color is normal.    HEENT:   the sclera are nonicteric.  The mucous membranes are moist.  The carotids are 2+ without bruits.  There is no thyromegaly.  There is no JVD.    Lungs: clear.  The chest wall is non tender.    Heart: Irregularly irregular normal S1-S2.Marland Kitchen  Soft systolic and diastolic murmur , gallops, or rubs. The PMI is not displaced.     Abdomen: good bowel sounds.  There is no guarding or rebound.  There is no hepatosplenomegaly or tenderness.  There are no masses.   Extremities:  no clubbing, cyanosis, or edema.  The legs are without rashes.  The distal pulses are intact.   Neuro:  Cranial nerves II - XII are intact.   Motor and sensory functions are intact.    The gait is normal.  ECG:   Jul 28, 2015:   Atria fib at 59.   Minimal voltage for LVH.   Assessment / Plan:  1. Atrial fibrillation - stable, INR levels are stable She would like to try a NOAC.   Will start Xarelto 20 mg a day .   She is Having some shortness of breath with exertion. We will repeat her echocardiogram to make sure that her left ventricular function is good. She has cardiomegaly by chest x-ray.  2. Remote stroke 3. Aortic insufficiency 4. Hypertension  5. Hypercholesterolemia - followed by Dr. Melford Aase  6. Pulmonary hypertension: She has moderate pulmonary hypertension by echo. She does have some shortness breath with exertion. If her PA pressures are still high, we may consider starting low-dose Lasix and potassium.   Mertie Moores, MD  07/28/2015 3:40 PM    [facility] [place],  [place], [place]  [address] Pager [phone] Phone: [phone]; Fax: [phone]   [facility]  [address] [place], [place]  [address] [phone]    Fax [phone]

## 2015-07-28 NOTE — Patient Instructions (Addendum)
Medication Instructions:  STOP Warfarin  START Xarelto 20 mg once daily  Labwork: TODAY - Basic metabolic panel   Testing/Procedures: Your physician has requested that you have an echocardiogram. Echocardiography is a painless test that uses sound waves to create images of your heart. It provides your doctor with information about the size and shape of your heart and how well your heart's chambers and valves are working. This procedure takes approximately one hour. There are no restrictions for this procedure.   Follow-Up: Your physician wants you to follow-up in: 6 months with Dr. Acie Fredrickson.  You will receive a reminder letter in the mail two months in advance. If you don't receive a letter, please call our office to schedule the follow-up appointment.   If you need a refill on your cardiac medications before your next appointment, please call your pharmacy.   Thank you for choosing [facility]! Christen Bame, RN [phone]

## 2015-08-16 ENCOUNTER — Ambulatory Visit (HOSPITAL_COMMUNITY): Payer: Medicare Other | Attending: Cardiology

## 2015-08-16 ENCOUNTER — Other Ambulatory Visit: Payer: Self-pay

## 2015-08-16 DIAGNOSIS — I351 Nonrheumatic aortic (valve) insufficiency: Secondary | ICD-10-CM | POA: Insufficient documentation

## 2015-08-16 DIAGNOSIS — I10 Essential (primary) hypertension: Secondary | ICD-10-CM | POA: Diagnosis not present

## 2015-08-16 DIAGNOSIS — Z87891 Personal history of nicotine dependence: Secondary | ICD-10-CM | POA: Insufficient documentation

## 2015-08-16 DIAGNOSIS — I110 Hypertensive heart disease with heart failure: Secondary | ICD-10-CM | POA: Diagnosis not present

## 2015-08-16 DIAGNOSIS — I272 Other secondary pulmonary hypertension: Secondary | ICD-10-CM | POA: Diagnosis not present

## 2015-08-16 DIAGNOSIS — E785 Hyperlipidemia, unspecified: Secondary | ICD-10-CM | POA: Insufficient documentation

## 2015-08-16 DIAGNOSIS — I482 Chronic atrial fibrillation, unspecified: Secondary | ICD-10-CM

## 2015-08-16 DIAGNOSIS — I4891 Unspecified atrial fibrillation: Secondary | ICD-10-CM | POA: Diagnosis present

## 2015-08-16 DIAGNOSIS — I509 Heart failure, unspecified: Secondary | ICD-10-CM | POA: Diagnosis not present

## 2015-08-16 LAB — ECHOCARDIOGRAM COMPLETE
CHL CUP TV REG PEAK VELOCITY: 244 cm/s
FS: 28 % (ref 28–44)
IVS/LV PW RATIO, ED: 1.06
LA diam end sys: 39 cm
LA diam index: 2.32 cm/m2
LA vol A4C: 75.1 ml
LA vol: 73.4 cm3
LASIZE: 39 cm
LAVOLIN: 43.7 mL/m2
LV PW d: 11.9 mm — AB (ref 0.6–1.1)
LVOT SV: 62 cm3
LVOT VTI: 16.4 cm
LVOT area: 3.8 cm2
LVOT diameter: 22 cm
LVOT peak vel: 68.7 m/s
P 1/2 time: 703 ms
RV sys press: 32 mmHg
TR max vel: 244 m/s

## 2015-08-18 ENCOUNTER — Telehealth: Payer: Self-pay | Admitting: Nurse Practitioner

## 2015-08-18 DIAGNOSIS — I272 Pulmonary hypertension, unspecified: Secondary | ICD-10-CM

## 2015-08-18 MED ORDER — POTASSIUM CHLORIDE ER 10 MEQ PO TBCR: 10.0000 meq | EXTENDED_RELEASE_TABLET | Freq: Every day | ORAL | Status: DC

## 2015-08-18 MED ORDER — FUROSEMIDE 40 MG PO TABS: 40.0000 mg | ORAL_TABLET | Freq: Every day | ORAL | Status: DC

## 2015-08-18 NOTE — Telephone Encounter (Signed)
-----   Message from Thayer Headings, MD sent at 08/17/2015  9:18 AM EDT ----- The is some evidence of pulmonary HTN - even thought the estimated pressures are not all that high.  She has had moderate Pulmonary HTN in the past Lets start Lasix 40 mg a day and Kdur 10 meq a day .   Check BMP in 2-3 weeks .

## 2015-08-18 NOTE — Telephone Encounter (Signed)
Reviewed results and plan of care with patient.  She is scheduled for CVRR appointment and lab work on 6/21 for follow-up of switching from warfarin to Xarelto and for bmet for starting lasix therapy.  She verbalized understanding and agreement with plan of care and is aware to call back with questions or concerns.

## 2015-09-01 ENCOUNTER — Ambulatory Visit (INDEPENDENT_AMBULATORY_CARE_PROVIDER_SITE_OTHER): Payer: Medicare Other | Admitting: *Deleted

## 2015-09-01 DIAGNOSIS — I4891 Unspecified atrial fibrillation: Secondary | ICD-10-CM | POA: Diagnosis not present

## 2015-09-01 DIAGNOSIS — I272 Other secondary pulmonary hypertension: Secondary | ICD-10-CM

## 2015-09-01 LAB — CBC WITH DIFFERENTIAL/PLATELET
BASOS ABS: 90 {cells}/uL (ref 0–200)
Basophils Relative: 2 %
EOS PCT: 2 %
Eosinophils Absolute: 90 cells/uL (ref 15–500)
HEMATOCRIT: 42.8 % (ref 35.0–45.0)
HEMOGLOBIN: 14.3 g/dL (ref 11.7–15.5)
LYMPHS ABS: 1080 {cells}/uL (ref 850–3900)
LYMPHS PCT: 24 %
MCH: 31.6 pg (ref 27.0–33.0)
MCHC: 33.4 g/dL (ref 32.0–36.0)
MCV: 94.5 fL (ref 80.0–100.0)
MPV: 9.4 fL (ref 7.5–12.5)
Monocytes Absolute: 585 cells/uL (ref 200–950)
Monocytes Relative: 13 %
NEUTROS PCT: 59 %
Neutro Abs: 2655 cells/uL (ref 1500–7800)
Platelets: 201 10*3/uL (ref 140–400)
RBC: 4.53 MIL/uL (ref 3.80–5.10)
RDW: 13.8 % (ref 11.0–15.0)
WBC: 4.5 10*3/uL (ref 3.8–10.8)

## 2015-09-01 NOTE — Progress Notes (Signed)
Pt was started on Xarelto 20mg  for Afib on 07/28/2015.    Reviewed patients medication list.  Pt is not currently on any combined P-gp and strong CYP3A4 inhibitors/inducers (ketoconazole, traconazole, ritonavir, carbamazepine, phenytoin, rifampin, St. John's wort).  Reviewed labs.  SCr 0.71, Weight 60.5Kg, CrCl- 57.34. Age 34yrs old.  Dose appropriate based on CrCl.   Hgb and HCT 14.3/42.8.  A full discussion of the nature of anticoagulants has been carried out.  A benefit/risk analysis has been presented to the patient, so that they understand the justification for choosing anticoagulation with Xarelto at this time.  The need for compliance is stressed.  Pt is aware to take the medication once daily with the largest meal of the day.  Side effects of potential bleeding are discussed, including unusual colored urine or stools, coughing up blood or coffee ground emesis, nose bleeds or serious fall or head trauma.  Discussed signs and symptoms of stroke. The patient should avoid any OTC items containing aspirin or ibuprofen.  Avoid alcohol consumption.   Call if any signs of abnormal bleeding.  Discussed financial obligations and resolved any difficulty in obtaining medication.  Next lab test test in 3 months per [facility], Pharm D.

## 2015-09-02 LAB — BASIC METABOLIC PANEL
BUN: 21 mg/dL (ref 7–25)
CALCIUM: 9.1 mg/dL (ref 8.6–10.4)
CO2: 29 mmol/L (ref 20–31)
Chloride: 92 mmol/L — ABNORMAL LOW (ref 98–110)
Creat: 0.71 mg/dL (ref 0.60–0.88)
GLUCOSE: 90 mg/dL (ref 65–99)
Potassium: 4.1 mmol/L (ref 3.5–5.3)
SODIUM: 131 mmol/L — AB (ref 135–146)

## 2015-09-06 ENCOUNTER — Other Ambulatory Visit: Payer: Self-pay | Admitting: *Deleted

## 2015-09-06 MED ORDER — NEBIVOLOL HCL 5 MG PO TABS: 5.0000 mg | ORAL_TABLET | Freq: Every morning | ORAL | Status: DC

## 2015-11-29 ENCOUNTER — Other Ambulatory Visit: Payer: Self-pay | Admitting: Obstetrics & Gynecology

## 2015-11-30 ENCOUNTER — Ambulatory Visit (INDEPENDENT_AMBULATORY_CARE_PROVIDER_SITE_OTHER): Payer: Medicare Other | Admitting: *Deleted

## 2015-11-30 DIAGNOSIS — Z5181 Encounter for therapeutic drug level monitoring: Secondary | ICD-10-CM

## 2015-11-30 DIAGNOSIS — I482 Chronic atrial fibrillation, unspecified: Secondary | ICD-10-CM

## 2015-12-01 LAB — CBC
HEMATOCRIT: 41.6 % (ref 35.0–45.0)
Hemoglobin: 13.5 g/dL (ref 11.7–15.5)
MCH: 30.9 pg (ref 27.0–33.0)
MCHC: 32.5 g/dL (ref 32.0–36.0)
MCV: 95.2 fL (ref 80.0–100.0)
MPV: 9.6 fL (ref 7.5–12.5)
PLATELETS: 202 10*3/uL (ref 140–400)
RBC: 4.37 MIL/uL (ref 3.80–5.10)
RDW: 13.7 % (ref 11.0–15.0)
WBC: 3.6 10*3/uL — ABNORMAL LOW (ref 3.8–10.8)

## 2015-12-01 LAB — CYTOLOGY - PAP

## 2015-12-01 LAB — BASIC METABOLIC PANEL
BUN: 18 mg/dL (ref 7–25)
CALCIUM: 9 mg/dL (ref 8.6–10.4)
CHLORIDE: 96 mmol/L — AB (ref 98–110)
CO2: 30 mmol/L (ref 20–31)
CREATININE: 0.77 mg/dL (ref 0.60–0.88)
Glucose, Bld: 75 mg/dL (ref 65–99)
Potassium: 4.6 mmol/L (ref 3.5–5.3)
Sodium: 134 mmol/L — ABNORMAL LOW (ref 135–146)

## 2015-12-01 NOTE — Progress Notes (Signed)
Pt was started on Xarelto 20mg  for Afib on 07/28/2015.    Reviewed patients medication list.  Pt is not currently on any combined P-gp and strong CYP3A4 inhibitors/inducers (ketoconazole, traconazole, ritonavir, carbamazepine, phenytoin, rifampin, St. John's wort).  Reviewed labs.  SCr 0.77, Weight 61.1kg, CrCl- 53.70ml/min.  Dose appropriate based on CrCl.   Hgb and HCT 13.5/41.6.   A full discussion of the nature of anticoagulants has been carried out.  A benefit/risk analysis has been presented to the patient, so that they understand the justification for choosing anticoagulation with Xarelto at this time.  The need for compliance is stressed.  Pt is aware to take the medication once daily with the largest meal of the day.  Side effects of potential bleeding are discussed, including unusual colored urine or stools, coughing up blood or coffee ground emesis, nose bleeds or serious fall or head trauma.  Discussed signs and symptoms of stroke. The patient should avoid any OTC items containing aspirin or ibuprofen.  Avoid alcohol consumption.   Call if any signs of abnormal bleeding.  Discussed financial obligations and resolved any difficulty in obtaining medication.  Next lab test test in 3 Months due to drop in creatine clearance.

## 2016-02-10 ENCOUNTER — Encounter: Payer: Self-pay | Admitting: Cardiovascular Disease

## 2016-02-10 ENCOUNTER — Telehealth: Payer: Self-pay | Admitting: Internal Medicine

## 2016-02-10 ENCOUNTER — Ambulatory Visit (INDEPENDENT_AMBULATORY_CARE_PROVIDER_SITE_OTHER): Payer: Medicare Other | Admitting: *Deleted

## 2016-02-10 ENCOUNTER — Ambulatory Visit (INDEPENDENT_AMBULATORY_CARE_PROVIDER_SITE_OTHER): Payer: Medicare Other | Admitting: Cardiovascular Disease

## 2016-02-10 VITALS — BP 148/72 | HR 67 | Ht 61.0 in | Wt 135.0 lb

## 2016-02-10 DIAGNOSIS — I272 Pulmonary hypertension, unspecified: Secondary | ICD-10-CM

## 2016-02-10 DIAGNOSIS — I482 Chronic atrial fibrillation, unspecified: Secondary | ICD-10-CM

## 2016-02-10 DIAGNOSIS — I4891 Unspecified atrial fibrillation: Secondary | ICD-10-CM

## 2016-02-10 DIAGNOSIS — R0989 Other specified symptoms and signs involving the circulatory and respiratory systems: Secondary | ICD-10-CM

## 2016-02-10 LAB — CBC WITH DIFFERENTIAL/PLATELET
BASOS ABS: 92 {cells}/uL (ref 0–200)
Basophils Relative: 2 %
EOS PCT: 3 %
Eosinophils Absolute: 138 cells/uL (ref 15–500)
HCT: 40.7 % (ref 35.0–45.0)
HEMOGLOBIN: 13.3 g/dL (ref 11.7–15.5)
LYMPHS ABS: 644 {cells}/uL — AB (ref 850–3900)
Lymphocytes Relative: 14 %
MCH: 30.7 pg (ref 27.0–33.0)
MCHC: 32.7 g/dL (ref 32.0–36.0)
MCV: 94 fL (ref 80.0–100.0)
MPV: 9.1 fL (ref 7.5–12.5)
Monocytes Absolute: 552 cells/uL (ref 200–950)
Monocytes Relative: 12 %
NEUTROS PCT: 69 %
Neutro Abs: 3174 cells/uL (ref 1500–7800)
Platelets: 215 10*3/uL (ref 140–400)
RBC: 4.33 MIL/uL (ref 3.80–5.10)
RDW: 13.4 % (ref 11.0–15.0)
WBC: 4.6 10*3/uL (ref 3.8–10.8)

## 2016-02-10 LAB — BASIC METABOLIC PANEL
BUN: 14 mg/dL (ref 7–25)
CALCIUM: 9 mg/dL (ref 8.6–10.4)
CO2: 31 mmol/L (ref 20–31)
CREATININE: 0.72 mg/dL (ref 0.60–0.88)
Chloride: 97 mmol/L — ABNORMAL LOW (ref 98–110)
GLUCOSE: 91 mg/dL (ref 65–99)
Potassium: 4.1 mmol/L (ref 3.5–5.3)
SODIUM: 133 mmol/L — AB (ref 135–146)

## 2016-02-10 MED ORDER — POTASSIUM CHLORIDE ER 10 MEQ PO TBCR: 10.0000 meq | EXTENDED_RELEASE_TABLET | Freq: Every day | ORAL | 11 refills | Status: DC

## 2016-02-10 MED ORDER — FUROSEMIDE 40 MG PO TABS: 40.0000 mg | ORAL_TABLET | Freq: Every day | ORAL | 11 refills | Status: DC

## 2016-02-10 NOTE — Patient Instructions (Signed)
.  Medication Instructions:  Your physician recommends that you continue on your current medications as directed. Please refer to the Current Medication list given to you today. 1. Lasix and Potassium were sent in today to patient's requested pharmacy.   Labwork: -None  Testing/Procedures: -None  Follow-Up: Your physician wants you to follow-up in: 6 months with Dr. Acie Fredrickson.  You will receive a reminder letter in the mail two months in advance. If you don't receive a letter, please call our office to schedule the follow-up appointment.   Any Other Special Instructions Will Be Listed Below (If Applicable).     If you need a refill on your cardiac medications before your next appointment, please call your pharmacy.

## 2016-02-10 NOTE — Telephone Encounter (Signed)
Pt is scheduled for PFT at [facility] 12/13 at 10am Nothing further needed.

## 2016-02-10 NOTE — Progress Notes (Signed)
Natalie Ho  Date of Birth  19-Apr-1930 [facility] [address]    [place], [place]  [address] [phone]  Fax  [phone]  Problems: 1. Atrial fibrillation 2. Remote stroke 3. Aortic insufficiency 4. Hypertension 5. Hypercholesterolemia  History of Present Illness:  82 yo Hx of a-Fib, remote stroke, HTN, hypercholesterolemia.  Her last visit we added amlodipine 2.5 mg a day. She also started taking her Benicar 20 mg twice a day instead of 40 mg at once. She is feeling quite a bit better. Her blood pressure readings have stabilized. She still occasionally has some signs of mild hypertension with a systolic blood pressure [id number].      She also has episodes where her blood pressure will become fairly low especially at night. They're given some nights when she has held her medication for several hours because her blood pressure was in the 90 range.  She's not been limited by chest pain or shortness of breath.  She has been having some problems with hypertension.  We added amlodipine 2.5 mg at her last visit.  Her blood pressures have been better.    Jul 11, 2012:  She has done well after her left hip replacement.   She has some dyspnea but is overall doing well.   She has stopped her Benicar and is feeling much better.  She was having low BP readings.   September 07, 2014   Natalie Ho is doing well .  No CP .  Mild dyspnea with exercise.  Jul 28, 2015:  Has some DOE with mowing the grass and vacuuming . Echo in 2014 shows normal LV function. She did have moderate pulmonary HTN ( PA pressure of 46)  BP has been ok at home. A bit elevated here.  Has a chronic cough  Records from Dr. Melford Aase were reviewed.   Labs look great.   Nov. 30, 2017:  Natalie Ho is seen today for follow up of her atrial fib and pulmonary HTN. Has shortness of breath with exertion .  The lasix has not helped.  Has seen a pulmonologist .    Current Outpatient Prescriptions on File Prior to  Visit  Medication Sig Dispense Refill  . aspirin EC 81 MG tablet Take 81 mg by mouth daily.    Marland Kitchen atorvastatin (LIPITOR) 20 MG tablet Take 1 tablet (20 mg total) by mouth at bedtime. 90 tablet 3  . calcium citrate (CALCITRATE - DOSED IN MG ELEMENTAL CALCIUM) 950 MG tablet Take 1 tablet by mouth daily.    . cholecalciferol (VITAMIN D) 1000 UNITS tablet Take 1,000 Units by mouth daily.    . furosemide (LASIX) 40 MG tablet Take 1 tablet (40 mg total) by mouth daily. 30 tablet 11  . nebivolol (BYSTOLIC) 5 MG tablet Take 1 tablet (5 mg total) by mouth every morning. 90 tablet 2  . nystatin-triamcinolone (MYCOLOG II) cream Apply 1 application topically daily as needed (fungus). On fungus    . potassium chloride (K-DUR) 10 MEQ tablet Take 1 tablet (10 mEq total) by mouth daily. 30 tablet 11  . rivaroxaban (XARELTO) 20 MG TABS tablet Take 1 tablet (20 mg total) by mouth daily with supper. 30 tablet 11  . sertraline (ZOLOFT) 50 MG tablet Take 50 mg by mouth every morning.     Marland Kitchen SIMBRINZA 1-0.2 % SUSP Place 1 drop into both eyes 2 (two) times daily.  0   No current facility-administered medications on file prior to visit.  Allergies  Allergen Reactions  . Benzonatate Other (See Comments)    Unknown reaction  . Shrimp [Shellfish Allergy] Anaphylaxis and Swelling  . Alendronate Sodium Other (See Comments)    Eyes and head hurt  . Dabigatran Etexilate Mesylate Other (See Comments)    Upset stomach"Pradaxa"  . Levofloxacin Other (See Comments)    "made her funny in the head"  . Pradaxa [Dabigatran Etexilate Mesylate] Other (See Comments)    "sick to my stomach and lost weight"  . Sulfonamide Derivatives Other (See Comments)    Unknown reaction  . Tramadol Other (See Comments)    "made me feel funny in my head"    Past Medical History:  Diagnosis Date  . Anxiety   . Aortic insufficiency   . Arthritis   . Atrial fibrillation ([place])   . Chronic anticoagulation   . Colitis   . GERD  (gastroesophageal reflux disease)   . Glaucoma    both eyes  . Gout   . Hyperlipidemia   . Hypertension   . Melanoma of nose ([place])    "was only a precancer area"-no problems since  . SOB (shortness of breath)   . Stroke [facility])     2007 and  TIA-3'08, none recent  . TIA (transient ischemic attack)   . Tinnitus of both ears     Past Surgical History:  Procedure Laterality Date  . BLEPHAROPLASTY     bilateral  . CARDIOVERSION     Unsuccessful  . cataracts     bilateral  . CHOLECYSTECTOMY N/A 02/26/2013   Procedure: LAPAROSCOPIC CHOLECYSTECTOMY WITH INTRAOPERATIVE CHOLANGIOGRAM;  Surgeon: Edward Jolly, MD;  Location: [facility];  Service: General;  Laterality: N/A;  . colon polyps     removed via colonoscopy  . eye surgeries     Multiple eye surgeries for torned retina-bilateral  . FRACTURE SURGERY     ORIF-Lt. hip -,removed hardware  . TONSILLECTOMY    . TOTAL HIP ARTHROPLASTY  04/03/2012   Procedure: TOTAL HIP ARTHROPLASTY ANTERIOR APPROACH;  Surgeon: Gearlean Alf, MD;  Location: [facility];  Service: Orthopedics;  Laterality: Left;    History  Smoking Status  . Former Smoker  . Packs/day: 0.50  . Years: 25.00  . Types: Cigarettes  . Quit date: 03/13/1990  Smokeless Tobacco  . Never Used    History  Alcohol Use  . 2.5 oz/week  . 5 drink(s) per week    Comment: wine daily    Family History  Problem Relation Age of Onset  . Heart disease Mother     Reviw of Systems:  Reviewed in the HPI.  All other systems are negative.  Physical Exam: BP (!) 148/72   Pulse 67   Ht 5\' 1"  (1.549 m)   Wt 135 lb (61.2 kg)   BMI 25.51 kg/m  The patient is alert and oriented x 3.  The mood and affect are normal.   Skin: warm and dry.  Color is normal.    HEENT:   the sclera are nonicteric.  The mucous membranes are moist.  The carotids are 2+ without bruits.  There is no thyromegaly.  There is no JVD.   Lungs: clear.  The chest wall is non tender.    Heart: Irregularly  irregular normal S1-S2.Marland Kitchen  Soft systolic and diastolic murmur , gallops, or rubs. The PMI is not displaced.   Abdomen: good bowel sounds.  There is no guarding or rebound.  There is no hepatosplenomegaly or tenderness.  There are no masses.   Extremities:  no clubbing, cyanosis, or edema.  The legs are without rashes.  The distal pulses are intact.   Neuro:  Cranial nerves II - XII are intact.  Motor and sensory functions are intact.    The gait is normal.  ECG:   Jul 28, 2015:   Atria fib at 1.   Minimal voltage for LVH.   Assessment / Plan:   1. Severe DOE:  Plumonary HTN  Ludora continues to have severe shortness of breath especially with any exertion. This is progressed over the past 6 months or so. She now has to be dropped off at the front of stores in order to go shopping. She gets short of breath walking as little as 50 feet.  Is known moderate pulmonary hypertension. She has a history of cigarette smoking many years ago.  She has been seen by Dr. Annamaria Boots. I would like to request a full set of pulmonary function tests including diffusion capacity and DLCO.  If were not able to find any specific pulmonary issues then we will likely need to send her to see Dr. Haroldine Laws for evaluation of her pulmonary hypertension. She may need a right left heart catheterization for further evaluation.  1. Atrial fibrillation - stable, INR levels are stable On Xarelto 20 mg a day .  Seems to be stable  2. Remote stroke 3. Aortic insufficiency 4. Hypertension  5. Hypercholesterolemia - followed by Dr. Rowe Pavy, MD  02/10/2016 9:10 AM    [facility] [place],  [place], [place]  [address] Pager [phone] Phone: [phone]; Fax: [phone]   [facility]  [address] [place], [place]  [address] [phone]    Fax [phone]

## 2016-02-10 NOTE — Progress Notes (Signed)
We will order PFT

## 2016-02-10 NOTE — Telephone Encounter (Signed)
Per Dr Annamaria Boots, patient needs PFT scheduled for Pulmonary Hypertension.  This was requested to be done by Dr Acie Fredrickson.

## 2016-02-10 NOTE — Progress Notes (Signed)
Pt was started on Xarelto 20mg  for AFIB on 07/28/15 by Dr. Acie Fredrickson.  Reviewed patients medication list.  Pt is not currently on any combined P-gp and strong CYP3A4 inhibitors/inducers (ketoconazole, traconazole, ritonavir, carbamazepine, phenytoin, rifampin, St. John's wort).  Reviewed labs: SCr-0.72, Weight-61.2kg, Hb-13.3, HCT-40.7 & CrCl-57.2; Dose appropriate based on CrCl.   Hgb and HCT WNL.  A full discussion of the nature of anticoagulants has been carried out.  A benefit/risk analysis has been presented to the patient, so that they understand the justification for choosing anticoagulation with Xarelto at this time. The need for compliance is stressed.  Pt is aware to take the medication once daily with the largest meal of the day.  Side effects of potential bleeding are discussed, including unusual colored urine or stools, coughing up blood or coffee ground emesis, nose bleeds or serious fall or head trauma.  Discussed signs and symptoms of stroke. The patient should avoid any OTC items containing aspirin or ibuprofen.  Avoid alcohol consumption.   Call if any signs of abnormal bleeding.  Discussed financial obligations and resolved any difficulty in obtaining medication.  Next lab test test in 6 months.   Spoke with pt and discussed her lab results.  Instructed her to continue taking Xarelto 20mg  daily. Advised her to call with changes, scheduled procedures, and any questions & she verbalized understanding.

## 2016-02-23 ENCOUNTER — Ambulatory Visit (HOSPITAL_COMMUNITY)
Admission: RE | Admit: 2016-02-23 | Discharge: 2016-02-23 | Disposition: A | Discharge status: Home / Self Care | Payer: Medicare Other | Source: Ambulatory Visit | Attending: Internal Medicine | Admitting: Internal Medicine

## 2016-02-23 DIAGNOSIS — R0989 Other specified symptoms and signs involving the circulatory and respiratory systems: Secondary | ICD-10-CM

## 2016-02-23 LAB — PULMONARY FUNCTION TEST
DL/VA % PRED: 91 %
DL/VA: 4.71 ml/min/mmHg/L
DLCO cor % pred: 38 %
DLCO cor: 10.95 ml/min/mmHg
DLCO unc % pred: 21 %
DLCO unc: 5.97 ml/min/mmHg
FEF 25-75 PRE: 0.68 L/s
FEF 25-75 Post: 1.22 L/sec
FEF2575-%CHANGE-POST: 78 %
FEF2575-%Pred-Post: 87 %
FEF2575-%Pred-Pre: 48 %
FEV1-%CHANGE-POST: 14 %
FEV1-%PRED-PRE: 50 %
FEV1-%Pred-Post: 57 %
FEV1-POST: 1.22 L
FEV1-PRE: 1.06 L
FEV1FVC-%CHANGE-POST: -8 %
FEV1FVC-%Pred-Pre: 97 %
FEV6-%Change-Post: 25 %
FEV6-%PRED-POST: 69 %
FEV6-%PRED-PRE: 55 %
FEV6-PRE: 1.48 L
FEV6-Post: 1.86 L
FEV6FVC-%PRED-PRE: 105 %
FEV6FVC-%Pred-Post: 105 %
FVC-%Change-Post: 25 %
FVC-%PRED-POST: 66 %
FVC-%Pred-Pre: 52 %
FVC-Post: 1.86 L
FVC-Pre: 1.48 L
POST FEV1/FVC RATIO: 65 %
POST FEV6/FVC RATIO: 100 %
PRE FEV6/FVC RATIO: 100 %
Pre FEV1/FVC ratio: 71 %
RV % PRED: 219 %
RV: 5.74 L
TLC % PRED: 132 %
TLC: 7.29 L

## 2016-02-23 MED ORDER — ALBUTEROL SULFATE (2.5 MG/3ML) 0.083% IN NEBU
2.5000 mg | INHALATION_SOLUTION | Freq: Once | RESPIRATORY_TRACT | Status: AC
  Administered 2016-02-23: 2.5 mg via RESPIRATORY_TRACT

## 2016-03-22 ENCOUNTER — Ambulatory Visit (INDEPENDENT_AMBULATORY_CARE_PROVIDER_SITE_OTHER)
Admission: RE | Admit: 2016-03-22 | Discharge: 2016-03-22 | Disposition: A | Discharge status: Home / Self Care | Payer: Medicare Other | Source: Ambulatory Visit | Attending: Internal Medicine | Admitting: Internal Medicine

## 2016-03-22 ENCOUNTER — Ambulatory Visit (INDEPENDENT_AMBULATORY_CARE_PROVIDER_SITE_OTHER): Payer: Medicare Other | Admitting: Internal Medicine

## 2016-03-22 ENCOUNTER — Encounter: Payer: Self-pay | Admitting: Internal Medicine

## 2016-03-22 VITALS — BP 110/72 | HR 89 | Ht 65.0 in | Wt 133.0 lb

## 2016-03-22 DIAGNOSIS — I482 Chronic atrial fibrillation, unspecified: Secondary | ICD-10-CM

## 2016-03-22 DIAGNOSIS — J441 Chronic obstructive pulmonary disease with (acute) exacerbation: Secondary | ICD-10-CM

## 2016-03-22 DIAGNOSIS — J449 Chronic obstructive pulmonary disease, unspecified: Secondary | ICD-10-CM

## 2016-03-22 MED ORDER — UMECLIDINIUM-VILANTEROL 62.5-25 MCG/INH IN AEPB: INHALATION_SPRAY | RESPIRATORY_TRACT | 12 refills | Status: DC

## 2016-03-22 MED ORDER — METHYLPREDNISOLONE ACETATE 80 MG/ML IJ SUSP
80.0000 mg | Freq: Once | INTRAMUSCULAR | Status: AC
  Administered 2016-03-22: 80 mg via INTRAMUSCULAR

## 2016-03-22 MED ORDER — BENZONATATE 200 MG PO CAPS: 200.0000 mg | ORAL_CAPSULE | Freq: Three times a day (TID) | ORAL | 1 refills | Status: DC | PRN

## 2016-03-22 MED ORDER — AZITHROMYCIN 250 MG PO TABS
ORAL_TABLET | ORAL | 0 refills | Status: DC
Start: 2016-03-22 — End: 2016-07-20

## 2016-03-22 MED ORDER — LEVALBUTEROL HCL 0.63 MG/3ML IN NEBU
0.6300 mg | INHALATION_SOLUTION | Freq: Once | RESPIRATORY_TRACT | Status: AC
  Administered 2016-03-22: 0.63 mg via RESPIRATORY_TRACT

## 2016-03-22 MED ORDER — UMECLIDINIUM-VILANTEROL 62.5-25 MCG/INH IN AEPB: 1.0000 | INHALATION_SPRAY | Freq: Every day | RESPIRATORY_TRACT | 0 refills | Status: DC

## 2016-03-22 NOTE — Patient Instructions (Addendum)
Order- Walk test on room air   Dx COPD mixed type  Order- schedule ONOX room air     Dx copd mixed type  Order CXR    Dx COPD acute exacerbation  Neb xop 0.63   Dx COPD exacerbation  Depo 80  Sample and script printed to try Anoro Ellipta inhaler      Inhale 1 puff, once daily

## 2016-03-22 NOTE — Assessment & Plan Note (Addendum)
Gradually increased dyspnea doesn't seem to responded to Lasix begun in September. Can't exclude cardiac component for which she is followed by cardiology. PFTs show severe obstructive airways disease with air trapping and an emphysema pattern sufficient to cause significant dyspnea on exertion. Not anemic in November and no overt blood loss. Plan-walk test on room air, overnight oximetry, update CXR, Tessalon Perles, try Anoro Ellipta inhaler

## 2016-03-22 NOTE — Assessment & Plan Note (Signed)
No pacemaker. Rhythm seems very regular and may be sinus on exam at this visit. Followed by cardiology

## 2016-03-22 NOTE — Progress Notes (Signed)
HPI female former smoker followed for dyspnea on exertion, asthma/bronchitis, complicated by history lung nodule, GERD, A. fib, HBP, TIA, glaucoma   PFT: 04/20/2011-mild obstructive airways disease with insignificant response to bronchodilator, air trapping, diffusion mildly reduced. FEV1/FVC 0.63, DLCO 76%. 6 minute walk test-97%, 96%, 98%, 513 m. Well-maintained oxygenation. CT chest 04/20/12 IMPRESSION:  1. No evidence of acute pulmonary embolism.  2. Small pleural effusions, cardiomegaly, and slightly prominent  interstitial markings may indicate very mild interstitial edema.  3. Calcified mediastinal and hilar nodes with calcified right  upper lobe granuloma consistent with prior granulomatous disease.  Original Report Authenticated By: Ivar Drape, M.D PFT 02/23/2016- Severe obstructive airways disease minimal response to bronchodilator, air trapping, overinflation, diffusion reduced. Emphysema pattern. FVC 1.86/66%, FEV1 1.22/57%, ratio 0.65, RV 219%, TLC 132%, DLCO 21%  -----------------------------------------------------------  03/23/15-83 year old female former smoker followed for dyspnea on exertion, asthma/bronchitis, complicated by history lung nodule, GERD, A. fib, HBP, TIA, glaucoma    husband here FOLLOWS FOR: Pt states that breathing is "fair" compared to last visit. Still not at baseline. Increased SOB with activity. Little cough or wheeze. No real change in chronic dyspnea with exertion. She is not physically very active. Mouth breather at night, awaking with dry mouth Up-to-date on flu and pneumonia vaccines  03/22/2016-83 year old female former smoker followed for dyspnea on exertion, asthma/bronchitis, complicated by history lung nodule, GERD, A. fib, HBP, TIA, glaucoma FOLLOWS FOR: Pt is having cough; trouble walking more than 50-75 ft without having to stop from SOB-sometimes less ft than that. Husband has to drop patient off at front doors to make sure she is able to  walk. Feels she has had a light chest cold in the last week or so with sputum turning darker yellow. No distinct fever. Gradually more aware of dyspnea on exertion over the last several months with no sudden event. On Lasix since September without ankle edema. At baseline denies routine cough but admits occasional mild wheeze. No inhalers. CXR 03/23/2015- IMPRESSION: Stable COPD.  No active disease.  Cardiomegaly again noted. PFT 02/23/2016- Severe obstructive airways disease minimal response to bronchodilator, air trapping, overinflation, diffusion reduced. Emphysema pattern. FVC 1.86/66%, FEV1 1.22/57%, ratio 0.65, RV 219%, TLC 132%, DLCO 21%.  ROS-see HPI Constitutional:   No-   weight loss, night sweats, fevers, chills, fatigue, lassitude. HEENT:   No-  headaches, difficulty swallowing, tooth/dental problems, sore throat,       No-  sneezing, itching, ear ache, nasal congestion, post nasal drip,  CV:  No-   chest pain, orthopnea, PND, swelling in lower extremities, anasarca,  dizziness, palpitations Resp: +  shortness of breath with exertion or at rest.              +productive cough,  non-productive cough,  No- coughing up of blood.                +change in color of mucus.  + wheezing.   Skin: No-   rash or lesions. GI:  No-   heartburn, indigestion, abdominal pain, nausea, vomiting, GU: . MS:  No-   joint pain or swelling.   Neuro-     nothing unusual Psych:  No- change in mood or affect. No depression or anxiety.  No memory loss.  OBJ- Physical Exam General- Alert, Oriented, Affect-appropriate, Distress- none acute Skin- rash-none, lesions- none, excoriation- none Lymphadenopathy- none Head- atraumatic            Eyes- Gross vision intact, PERRLA, conjunctivae and secretions clear  Ears- Hearing, canals-normal            Nose- Clear, no-Septal dev, mucus, polyps, erosion, perforation             Throat- Mallampati II , mucosa clear , drainage- none, tonsils-  atrophic Neck- flexible , trachea midline, no stridor , thyroid nl, carotid no bruit Chest - symmetrical excursion , unlabored           Heart/CV- +RRR on exam this visit, no murmur , no gallop  , no rub, nl s1 s2                           - JVD- none , edema- none, stasis changes- none, varices- none           Lung- clear to P&A,  cough-none , dullness-none, rub- none, wheeze + slight right upper lung           Chest wall-  Abd-  Br/ Gen/ Rectal- Not done, not indicated Extrem- cyanosis- none, clubbing, none, atrophy- none, strength- nl Neuro- grossly intact to observation

## 2016-03-27 ENCOUNTER — Other Ambulatory Visit: Payer: Self-pay

## 2016-03-27 DIAGNOSIS — R911 Solitary pulmonary nodule: Secondary | ICD-10-CM

## 2016-03-27 DIAGNOSIS — J449 Chronic obstructive pulmonary disease, unspecified: Secondary | ICD-10-CM

## 2016-03-28 ENCOUNTER — Telehealth: Payer: Self-pay | Admitting: Internal Medicine

## 2016-03-28 MED ORDER — UMECLIDINIUM-VILANTEROL 62.5-25 MCG/INH IN AEPB: INHALATION_SPRAY | RESPIRATORY_TRACT | 12 refills | Status: DC

## 2016-03-28 NOTE — Telephone Encounter (Signed)
Called and spoke with the pharmacy and they stated that they did not have the rx for the anoro.  This has been sent in to them.  I called to make the pt aware of this. Nothing further is needed.

## 2016-04-25 ENCOUNTER — Ambulatory Visit (INDEPENDENT_AMBULATORY_CARE_PROVIDER_SITE_OTHER)
Admission: RE | Admit: 2016-04-25 | Discharge: 2016-04-25 | Disposition: A | Discharge status: Home / Self Care | Payer: Medicare Other | Source: Ambulatory Visit | Attending: Internal Medicine | Admitting: Internal Medicine

## 2016-04-25 DIAGNOSIS — J449 Chronic obstructive pulmonary disease, unspecified: Secondary | ICD-10-CM

## 2016-04-25 DIAGNOSIS — R911 Solitary pulmonary nodule: Secondary | ICD-10-CM | POA: Diagnosis not present

## 2016-04-26 ENCOUNTER — Encounter: Payer: Self-pay | Admitting: Internal Medicine

## 2016-04-28 ENCOUNTER — Telehealth: Payer: Self-pay | Admitting: Cardiovascular Disease

## 2016-04-28 NOTE — Telephone Encounter (Signed)
I called and spoke with the patient. She reports episodes of dizziness/ lightheadedness today.  About 30 minutes prior to speaking with her, she was 79/42, then her SBP dropped to 72 just prior to my calling her.  No recent medication changes noted. She reports that her SBP is running 80-110's mostly. She does take lasix 40 mg once daily and bystolic 5 mg once daily. She did have this about 10 am this morning.  Reviewed with Dr. Acie Fredrickson, orders received to have the patient hold lasix tomorrow & Sunday. She should hold her bystolic if her SBP is 0000000. She should call the office on Monday morning prior to taking her lasix to let Dr. Acie Fredrickson know what her BP readings are. He would like her to eat chicken noodle soup/ drink V8/ push fluids over the weekend.  The patient is aware and verbalizes understanding of Dr. Elmarie Shiley recommendations. (per Dr. Acie Fredrickson, he is in the hospital on Monday).

## 2016-04-28 NOTE — Telephone Encounter (Signed)
Pt c/o BP issue: STAT if pt c/o blurred vision, one-sided weakness or slurred speech  1. What are your last 5 BP readings? ONLY 2  79/42 Steady  2. Are you having any other symptoms (ex. Dizziness, headache, blurred vision, passed out)? WEAKNESS  3. What is your BP issue? LOW

## 2016-05-01 ENCOUNTER — Telehealth: Payer: Self-pay | Admitting: Internal Medicine

## 2016-05-01 NOTE — Telephone Encounter (Signed)
Patient is back home now and can be reached at [phone].

## 2016-05-01 NOTE — Progress Notes (Signed)
Left voicemail for patient to contact office for medical results.

## 2016-05-01 NOTE — Telephone Encounter (Signed)
Notes Recorded by Deneise Lever, MD on 04/26/2016 at 4:01 PM EST CXR- just shows COPD and old scaring changes now . No concerning new or active process. -------------------------------------------- Spoke with pt. She is aware of results. Nothing further was needed.

## 2016-05-02 NOTE — Telephone Encounter (Signed)
Spoke with patient to check on her progress. She states she took lasix and bystolic yesterday (Monday) and systolic BP was 83 mmHg. She denies dizziness, states she only had the 1 episode described in previous note. I asked her to check her vital signs while on the phone with me and she reports: BP currently 122/61 mmHg, heart rate 58 bpm. She reports that BP is higher in the morning than in the afternoon.  She has not taken any medications yet this morning. I advised her to continue lasix and take 1/2 bystolic (2.5 mg) until Friday morning. I advised her to call me back Friday morning to report BP and heart rate. I advised her to call back sooner with questions or concerns. She verbalized understanding and agreement.

## 2016-05-05 MED ORDER — FUROSEMIDE 20 MG PO TABS: 20.0000 mg | ORAL_TABLET | Freq: Every day | ORAL | 3 refills | Status: DC

## 2016-05-05 MED ORDER — NEBIVOLOL HCL 2.5 MG PO TABS: 2.5000 mg | ORAL_TABLET | Freq: Every day | ORAL | 11 refills | Status: DC

## 2016-05-05 NOTE — Telephone Encounter (Signed)
Follow up    Pt c/o BP issue: STAT if pt c/o blurred vision, one-sided weakness or slurred speech  1. What are your last 5 BP readings? 91/51 hr-75  2. Are you having any other symptoms (ex. Dizziness, headache, blurred vision, passed out)? no  3. What is your BP issue? Pt calling back with her BP for today per RN

## 2016-05-05 NOTE — Telephone Encounter (Signed)
It appears patient's BP remains low after decreasing bystolic to 2.5 mg. She remains on lasix 40 mg daily. Routing to Dr. Acie Fredrickson for advice.

## 2016-05-05 NOTE — Telephone Encounter (Signed)
Hold 1 day then Decrease Lasix to 20 mg  Keep [place] informed of your urine output

## 2016-05-05 NOTE — Telephone Encounter (Signed)
Spoke with patient and reviewed Dr. Elmarie Shiley advice with her. I advised her to call back if BP does not improve and/or if she notices a decrease in urinary output.  She verbalized understanding and agreement and thanked me for the call.

## 2016-05-18 ENCOUNTER — Other Ambulatory Visit: Payer: Self-pay | Admitting: Cardiovascular Disease

## 2016-07-07 ENCOUNTER — Ambulatory Visit (INDEPENDENT_AMBULATORY_CARE_PROVIDER_SITE_OTHER): Payer: Medicare Other

## 2016-07-07 ENCOUNTER — Ambulatory Visit (INDEPENDENT_AMBULATORY_CARE_PROVIDER_SITE_OTHER): Payer: Medicare Other | Admitting: Podiatry

## 2016-07-07 DIAGNOSIS — M21619 Bunion of unspecified foot: Secondary | ICD-10-CM | POA: Diagnosis not present

## 2016-07-07 DIAGNOSIS — B351 Tinea unguium: Secondary | ICD-10-CM

## 2016-07-07 DIAGNOSIS — L84 Corns and callosities: Secondary | ICD-10-CM

## 2016-07-07 DIAGNOSIS — M2042 Other hammer toe(s) (acquired), left foot: Secondary | ICD-10-CM | POA: Diagnosis not present

## 2016-07-10 NOTE — Progress Notes (Signed)
Subjective:    Patient ID: Natalie Ho, female   DOB: 83 y.o.   MRN: [id number]   HPI patient presents with inflamed fourth digit left with fluid buildup around the interphalangeal joint with keratotic lesion formation that's painful    Review of Systems  All other systems reviewed and are negative.       Objective:  Physical Exam  Cardiovascular: Intact distal pulses.   Musculoskeletal: Normal range of motion.  Neurological: She is alert.  Skin: Skin is warm.  Nursing note and vitals reviewed.  neurovascular status intact muscle strength was found to be adequate with range of motion within normal limits. Patient's found to have rotated fourth toe left with keratotic lesion formation that's painful and also is noted to have moderate digital deformity and structural bunion deformity     Assessment:   Poor foot structure with structural bunion hammertoe and keratotic lesion formation      Plan:    H&P reviewed and all conditions discussed in great length including the consideration long-term for structural correction. Today I went ahead and I debrided the lesion applied padding advised on wider shoes and if symptoms persist we'll need to consider other treatment for the future  X-rays indicate that there is moderate rotation of the toe with no indications of other pathology

## 2016-07-13 ENCOUNTER — Other Ambulatory Visit: Payer: Self-pay | Admitting: Cardiovascular Disease

## 2016-07-13 NOTE — Telephone Encounter (Signed)
Age 83 03/22/2016 Wt 60.3kg 02/10/2016 Saw Dr Acie Fredrickson 02/10/2016 SrCr 0.72 02/10/2016 Hgb 13.3 HCT 40.7 CrCl 55.36 Refill for Xarelto 20 mg daily done as requested

## 2016-07-20 ENCOUNTER — Ambulatory Visit (INDEPENDENT_AMBULATORY_CARE_PROVIDER_SITE_OTHER): Payer: Medicare Other | Admitting: Internal Medicine

## 2016-07-20 ENCOUNTER — Encounter: Payer: Self-pay | Admitting: Internal Medicine

## 2016-07-20 DIAGNOSIS — R911 Solitary pulmonary nodule: Secondary | ICD-10-CM | POA: Diagnosis not present

## 2016-07-20 DIAGNOSIS — J449 Chronic obstructive pulmonary disease, unspecified: Secondary | ICD-10-CM

## 2016-07-20 NOTE — Progress Notes (Signed)
HPI female former smoker followed for COPD, complicated by history lung nodule, GERD, A. fib, HBP, TIA, glaucoma   PFT: 04/20/2011-mild obstructive airways disease with insignificant response to bronchodilator, air trapping, diffusion mildly reduced. FEV1/FVC 0.63, DLCO 76%. 6 minute walk test-97%, 96%, 98%, 513 m. Well-maintained oxygenation. CT chest 04/20/12 IMPRESSION:  1. No evidence of acute pulmonary embolism.  2. Small pleural effusions, cardiomegaly, and slightly prominent  interstitial markings may indicate very mild interstitial edema.  3. Calcified mediastinal and hilar nodes with calcified right  upper lobe granuloma consistent with prior granulomatous disease.  Original Report Authenticated By: Ivar Drape, M.D PFT 02/23/2016- Severe obstructive airways disease minimal response to bronchodilator, air trapping, overinflation, diffusion reduced. Emphysema pattern. FVC 1.86/66%, FEV1 1.22/57%, ratio 0.65, RV 219%, TLC 132%, DLCO 21%  -----------------------------------------------------------  03/22/2016-83 year old female former smoker followed for dyspnea on exertion, asthma/bronchitis, complicated by history lung nodule, GERD, A. fib, HBP, TIA, glaucoma FOLLOWS FOR: Pt is having cough; trouble walking more than 50-75 ft without having to stop from SOB-sometimes less ft than that. Husband has to drop patient off at front doors to make sure she is able to walk. Feels she has had a light chest cold in the last week or so with sputum turning darker yellow. No distinct fever. Gradually more aware of dyspnea on exertion over the last several months with no sudden event. On Lasix since September without ankle edema. At baseline denies routine cough but admits occasional mild wheeze. No inhalers. CXR 03/23/2015- IMPRESSION: Stable COPD.  No active disease.  Cardiomegaly again noted. PFT 02/23/2016- Severe obstructive airways disease minimal response to bronchodilator, air trapping,  overinflation, diffusion reduced. Emphysema pattern. FVC 1.86/66%, FEV1 1.22/57%, ratio 0.65, RV 219%, TLC 132%, DLCO 21%.  07/20/16- 83 year old female former smoker followed for COPD,dyspnea on exertion,  complicated by history lung nodule, GERD, A. fib, HBP, TIA, glaucoma FOLLOWS FOR: Pt denies any wheezing, SOB, cough,congestion. Anoro has worked well for patient. CXR 04/25/16 IMPRESSION: 1. No acute cardiopulmonary disease. 2. Small questionable nodule versus focal infiltrate noted on the prior chest radiograph in the lateral right lung base has improved. No suspicious lung nodules. 3. Stable changes of COPD and healed granulomatous disease.  ROS-see HPI Constitutional:   No-   weight loss, night sweats, fevers, chills, fatigue, lassitude. HEENT:   No-  headaches, difficulty swallowing, tooth/dental problems, sore throat,       No-  sneezing, itching, ear ache, nasal congestion, post nasal drip,  CV:  No-   chest pain, orthopnea, PND, swelling in lower extremities, anasarca,  dizziness, palpitations Resp: +  shortness of breath with exertion or at rest.              +productive cough,  non-productive cough,  No- coughing up of blood.                +change in color of mucus.  + wheezing.   Skin: No-   rash or lesions. GI:  No-   heartburn, indigestion, abdominal pain, nausea, vomiting, GU: . MS:  No-   joint pain or swelling.   Neuro-     nothing unusual Psych:  No- change in mood or affect. No depression or anxiety.  No memory loss.  OBJ- Physical Exam General- Alert, Oriented, Affect-appropriate, Distress- none acute Skin- rash-none, lesions- none, excoriation- none Lymphadenopathy- none Head- atraumatic            Eyes- Gross vision intact, PERRLA, conjunctivae and secretions clear  Ears- Hearing, canals-normal            Nose- Clear, no-Septal dev, mucus, polyps, erosion, perforation             Throat- Mallampati II , mucosa clear , drainage- none, tonsils-  atrophic Neck- flexible , trachea midline, no stridor , thyroid nl, carotid no bruit Chest - symmetrical excursion , unlabored           Heart/CV- +RRR on exam this visit, no murmur , no gallop  , no rub, nl s1 s2                           - JVD- none , edema- none, stasis changes- none, varices- none           Lung- clear to P&A,  cough-none , dullness-none, rub- none, wheeze + slight right upper lung           Chest wall-  Abd-  Br/ Gen/ Rectal- Not done, not indicated Extrem- cyanosis- none, clubbing, none, atrophy- none, strength- nl Neuro- grossly intact to observation

## 2016-07-20 NOTE — Patient Instructions (Signed)
Ok to continue [employer] glad you are doing so well. Please call if we can help.

## 2016-07-22 NOTE — Assessment & Plan Note (Signed)
We will DC looks like old granulomatous scarring with no active concern. Plan-occasional chest x-ray as needed

## 2016-07-22 NOTE — Assessment & Plan Note (Signed)
She feels very stable and quite satisfied with Anoro maintenance inhaler

## 2016-08-16 ENCOUNTER — Encounter: Payer: Self-pay | Admitting: Cardiovascular Disease

## 2016-08-16 ENCOUNTER — Ambulatory Visit (INDEPENDENT_AMBULATORY_CARE_PROVIDER_SITE_OTHER): Payer: Medicare Other | Admitting: Cardiovascular Disease

## 2016-08-16 ENCOUNTER — Ambulatory Visit (INDEPENDENT_AMBULATORY_CARE_PROVIDER_SITE_OTHER): Payer: Self-pay | Admitting: *Deleted

## 2016-08-16 VITALS — BP 100/70 | HR 66 | Ht 67.5 in | Wt 134.5 lb

## 2016-08-16 DIAGNOSIS — I482 Chronic atrial fibrillation, unspecified: Secondary | ICD-10-CM

## 2016-08-16 DIAGNOSIS — I480 Paroxysmal atrial fibrillation: Secondary | ICD-10-CM

## 2016-08-16 DIAGNOSIS — Z7901 Long term (current) use of anticoagulants: Secondary | ICD-10-CM | POA: Diagnosis not present

## 2016-08-16 DIAGNOSIS — I272 Pulmonary hypertension, unspecified: Secondary | ICD-10-CM

## 2016-08-16 NOTE — Patient Instructions (Addendum)
Medication Instructions:  STOP Lasix (Furosemide) STOP Kdur (Potassium)   Labwork: TODAY - CBC, BMET   Testing/Procedures: None Ordered   Follow-Up: Call Sharyn Lull to report your Blood pressure readings in a week or two  Your physician wants you to follow-up in: 6 months with Dr. Acie Fredrickson.  You will receive a reminder letter in the mail two months in advance. If you don't receive a letter, please call our office to schedule the follow-up appointment.   If you need a refill on your cardiac medications before your next appointment, please call your pharmacy.   Thank you for choosing [facility]! Christen Bame, RN [phone]

## 2016-08-16 NOTE — Progress Notes (Signed)
Natalie Ho  Date of Birth  12-18-1930 Natalie Ho [address]    [place], [place]  [address] [phone]  Fax  [phone]  Problems: 1. Atrial fibrillation 2. Remote stroke 3. Aortic insufficiency 4. Hypertension 5. Hypercholesterolemia  History of Present Illness:  83 yo Hx of a-Fib, remote stroke, HTN, hypercholesterolemia.  Her last visit we added amlodipine 2.5 mg a day. She also started taking her Benicar 20 mg twice a day instead of 40 mg at once. She is feeling quite a bit better. Her blood pressure readings have stabilized. She still occasionally has some signs of mild hypertension with a systolic blood pressure 130.      She also has episodes where her blood pressure will become fairly low especially at night. They're given some nights when she has held her medication for several hours because her blood pressure was in the 90 range.  She's not been limited by chest pain or shortness of breath.  She has been having some problems with hypertension.  We added amlodipine 2.5 mg at her last visit.  Her blood pressures have been better.    Jul 11, 2012:  She has done well after her left hip replacement.   She has some dyspnea but is overall doing well.   She has stopped her Benicar and is feeling much better.  She was having low BP readings.   September 07, 2014   Natalie Ho is doing well .  No CP .  Mild dyspnea with exercise.  Jul 28, 2015:  Has some DOE with mowing the grass and vacuuming . Echo in 2014 shows normal LV function. She did have moderate pulmonary HTN ( PA pressure of 46)  BP has been ok at home. A bit elevated here.  Has a chronic cough  Records from Dr. Melford Aase were reviewed.   Labs look great.   Nov. 30, 2017:  Natalie Ho is seen today for follow up of her atrial fib and pulmonary HTN. Has shortness of breath with exertion .  The lasix has not helped.  Has seen a pulmonologist .  August 16, 2016:    Natalie Ho has had some low BP readings,   Has had presyncope Has shingles in her right eye  Was started on Anoro ( inhaler) and is breathing much better.    Current Outpatient Prescriptions on File Prior to Visit  Medication Sig Dispense Refill  . aspirin EC 81 MG tablet Take 81 mg by mouth daily.    Marland Kitchen atorvastatin (LIPITOR) 20 MG tablet Take 1 tablet (20 mg total) by mouth at bedtime. 90 tablet 3  . Calcium Citrate-Vitamin D (CITRACAL + D PO) Take 4 tablets by mouth daily.    . cholecalciferol (VITAMIN D) 1000 UNITS tablet Take 1,000 Units by mouth daily.    . furosemide (LASIX) 20 MG tablet Take 1 tablet (20 mg total) by mouth daily. 90 tablet 3  . latanoprost (XALATAN) 0.005 % ophthalmic solution Place 1-2 drops into both eyes daily.    . nebivolol (BYSTOLIC) 2.5 MG tablet Take 1 tablet (2.5 mg total) by mouth daily. 30 tablet 11  . nystatin-triamcinolone (MYCOLOG II) cream Apply 1 application topically daily as needed (fungus). On fungus    . potassium chloride (K-DUR) 10 MEQ tablet Take 1 tablet (10 mEq total) by mouth daily. 30 tablet 11  . SIMBRINZA 1-0.2 % SUSP Place 1 drop into both eyes 2 (two) times daily.  0  . umeclidinium-vilanterol (ANORO  ELLIPTA) 62.5-25 MCG/INH AEPB Inhale 1 puff, once dialy 60 each 12  . XARELTO 20 MG TABS tablet TAKE 1 TABLET BY MOUTH DAILY WITH SUPPER 30 tablet 5   No current facility-administered medications on file prior to visit.     Allergies  Allergen Reactions  . Benzonatate Other (See Comments)    Unknown reaction  . Shrimp [Shellfish Allergy] Anaphylaxis and Swelling  . Alendronate Sodium Other (See Comments)    Eyes and head hurt  . Dabigatran Etexilate Mesylate Other (See Comments)    Upset stomach"Pradaxa"  . Diamox [Acetazolamide]     Excessive sleepiness  . Levofloxacin Other (See Comments)    "made her funny in the head"  . Pradaxa [Dabigatran Etexilate Mesylate] Other (See Comments)    "sick to my stomach and lost weight"  . Sulfonamide Derivatives Other (See Comments)     Unknown reaction  . Tramadol Other (See Comments)    "made me feel funny in my head"    Past Medical History:  Diagnosis Date  . Anxiety   . Aortic insufficiency   . Arthritis   . Atrial fibrillation ([place])   . Chronic anticoagulation   . Colitis   . GERD (gastroesophageal reflux disease)   . Glaucoma    both eyes  . Gout   . Hyperlipidemia   . Hypertension   . Melanoma of nose ([place])    "was only a precancer area"-no problems since  . SOB (shortness of breath)   . Stroke [facility])     2007 and  TIA-3'08, none recent  . TIA (transient ischemic attack)   . Tinnitus of both ears     Past Surgical History:  Procedure Laterality Date  . BLEPHAROPLASTY     bilateral  . CARDIOVERSION     Unsuccessful  . cataracts     bilateral  . CHOLECYSTECTOMY N/A 02/26/2013   Procedure: LAPAROSCOPIC CHOLECYSTECTOMY WITH INTRAOPERATIVE CHOLANGIOGRAM;  Surgeon: Edward Jolly, MD;  Location: [facility];  Service: General;  Laterality: N/A;  . colon polyps     removed via colonoscopy  . eye surgeries     Multiple eye surgeries for torned retina-bilateral  . FRACTURE SURGERY     ORIF-Lt. hip -,removed hardware  . TONSILLECTOMY    . TOTAL HIP ARTHROPLASTY  04/03/2012   Procedure: TOTAL HIP ARTHROPLASTY ANTERIOR APPROACH;  Surgeon: Gearlean Alf, MD;  Location: [facility];  Service: Orthopedics;  Laterality: Left;    History  Smoking Status  . Former Smoker  . Packs/day: 0.50  . Years: 25.00  . Types: Cigarettes  . Quit date: 03/13/1990  Smokeless Tobacco  . Never Used    History  Alcohol Use  . 2.5 oz/week  . 5 drink(s) per week    Comment: wine daily    Family History  Problem Relation Age of Onset  . Heart disease Mother     Reviw of Systems:  Reviewed in the HPI.  All other systems are negative.  Physical Exam: BP 100/70   Pulse 66   Ht 5' 7.5" (1.715 m)   Wt 134 lb 8 oz (61 kg)   SpO2 98%   BMI 20.75 kg/m  The patient is alert and oriented x 3.  The mood and  affect are normal.   Skin: warm and dry.  Color is normal.    HEENT:   the sclera are nonicteric.  The mucous membranes are moist.  The carotids are 2+ without bruits.  There is no thyromegaly.  There is no JVD.   Lungs: clear.  The chest wall is non tender.    Heart: Irregularly irregular normal S1-S2.Marland Kitchen  Soft systolic and diastolic murmur , gallops, or rubs. The PMI is not displaced.   Abdomen: good bowel sounds.  There is no guarding or rebound.  There is no hepatosplenomegaly or tenderness.  There are no masses.   Extremities:  no clubbing, cyanosis, or edema.  The legs are without rashes.  The distal pulses are intact.   Neuro:  Cranial nerves II - XII are intact.  Motor and sensory functions are intact.    The gait is normal.  ECG:   August 16, 2016:  :   Atrial fib at HR of 66.   No acute changes   Assessment / Plan:   1. Severe DOE:  Plumonary HTN  Rossie Is doing much better on the new inhaler  We had started Lasix and Kdur for her pulmonary HTN but will need to hold these given her new hypotension    1. Atrial fibrillation - stable, INR levels are stable On Xarelto 20 mg a day .  Seems to be stable Continue Bystolic 2.5 mg for rate control   2. Remote stroke 3. Aortic insufficiency 4. Hypertension BP is actually low now. She is on Diamox - will DC the lasix and Kdur and follow   5. Hypercholesterolemia - followed by Dr. Rowe Pavy, MD  08/16/2016 8:43 AM    [address],  [place], [place]  [address] Pager [phone] Phone: [phone]; Fax: [phone]

## 2016-08-16 NOTE — Progress Notes (Signed)
Pt was started on Xarelto 20mg  daily for Afib on 07/25/15 by Dr. Acie Fredrickson.    Reviewed patients medication list.  Pt is not currently on any combined P-gp and strong CYP3A4 inhibitors/inducers (ketoconazole, traconazole, ritonavir, carbamazepine, phenytoin, rifampin, St. John's wort).  Reviewed labs:  SCr-0.75, Hgb-13.1, HCT-39.8, Weight-61kg, CrCl- 53.77.  Dose appropriate based on CrCl.  Hgb and HCT within normal limits.    A full discussion of the nature of anticoagulants has been carried out.  A benefit/risk analysis has been presented to the patient, so that they understand the justification for choosing anticoagulation with Xarelto at this time.  The need for compliance is stressed.  Pt is aware to take the medication once daily with the largest meal of the day.  Side effects of potential bleeding are discussed, including unusual colored urine or stools, coughing up blood or coffee ground emesis, nose bleeds or serious fall or head trauma.  Discussed signs and symptoms of stroke. The patient should avoid any OTC items containing aspirin or ibuprofen.  Avoid alcohol consumption.   Call if any signs of abnormal bleeding.  Discussed financial obligations and resolved any difficulty in obtaining medication.  Follow up in in 3 months   08/16/16-Pt taken to the lab for CBC& BMET.   08/17/16-Labs resulted & reviewed. Called pt & left a msg for pt to call back regarding labs & since CrCl is near 68ml/min will check pt back in 3 months.

## 2016-08-16 NOTE — Addendum Note (Signed)
Addended by: Emmaline Life on: 08/16/2016 09:20 AM   Modules accepted: Orders

## 2016-08-17 LAB — CBC
Hematocrit: 39.8 % (ref 34.0–46.6)
Hemoglobin: 13.1 g/dL (ref 11.1–15.9)
MCH: 31.3 pg (ref 26.6–33.0)
MCHC: 32.9 g/dL (ref 31.5–35.7)
MCV: 95 fL (ref 79–97)
PLATELETS: 227 10*3/uL (ref 150–379)
RBC: 4.19 x10E6/uL (ref 3.77–5.28)
RDW: 13.7 % (ref 12.3–15.4)
WBC: 3.4 10*3/uL (ref 3.4–10.8)

## 2016-08-17 LAB — BASIC METABOLIC PANEL
BUN / CREAT RATIO: 27 (ref 12–28)
BUN: 20 mg/dL (ref 8–27)
CALCIUM: 8.8 mg/dL (ref 8.7–10.3)
CHLORIDE: 97 mmol/L (ref 96–106)
CO2: 24 mmol/L (ref 18–29)
CREATININE: 0.75 mg/dL (ref 0.57–1.00)
GFR, EST AFRICAN AMERICAN: 85 mL/min/{1.73_m2} (ref >59)
GFR, EST NON AFRICAN AMERICAN: 73 mL/min/{1.73_m2} (ref >59)
Glucose: 55 mg/dL — ABNORMAL LOW (ref 65–99)
Potassium: 4 mmol/L (ref 3.5–5.2)
Sodium: 134 mmol/L (ref 134–144)

## 2016-09-25 ENCOUNTER — Ambulatory Visit (INDEPENDENT_AMBULATORY_CARE_PROVIDER_SITE_OTHER): Payer: Medicare Other | Admitting: Cardiology

## 2016-09-25 ENCOUNTER — Telehealth: Payer: Self-pay | Admitting: Cardiovascular Disease

## 2016-09-25 ENCOUNTER — Encounter: Payer: Self-pay | Admitting: Cardiology

## 2016-09-25 VITALS — BP 112/60 | HR 74 | Ht 67.5 in | Wt 140.8 lb

## 2016-09-25 DIAGNOSIS — I9589 Other hypotension: Secondary | ICD-10-CM | POA: Diagnosis not present

## 2016-09-25 DIAGNOSIS — R0609 Other forms of dyspnea: Secondary | ICD-10-CM | POA: Diagnosis not present

## 2016-09-25 MED ORDER — POTASSIUM CHLORIDE ER 10 MEQ PO TBCR: 10.0000 meq | EXTENDED_RELEASE_TABLET | Freq: Every day | ORAL | 3 refills | Status: DC

## 2016-09-25 MED ORDER — FUROSEMIDE 40 MG PO TABS: 40.0000 mg | ORAL_TABLET | Freq: Every day | ORAL | 3 refills | Status: DC

## 2016-09-25 NOTE — Telephone Encounter (Signed)
Routed to triage, patient does not have a PPM/ICD.

## 2016-09-25 NOTE — Telephone Encounter (Signed)
Called and spoke to patient she stated that she is currently having palpitations with labored breathing. Patient states that she has gained 10 pounds in one week due to fluid. Patient also states that she was weighing herself and fell and has now hurt her back. She is unable to stand or sit for a long period of time. Only able to lay down. Spoke to Winifred Olive, LPN she will have a triage nurse give patient a call back.

## 2016-09-25 NOTE — Telephone Encounter (Signed)
Spoke with the pt, and she is calling to inform Dr Acie Fredrickson and RN that over the past 2 weeks or so, she has noticed her weight trending up, and she is now at 140 lbs.  Pt states that on her last OV with Dr Acie Fredrickson (on 6/6), she weighed 134 lbs.   Pt states that her lasix and K-Dur was discontinued at that visit, due to hypotension.  Pt states she is in no acute distress, but does get more sob on exertion.  Pt denies any chest pain at this time.  Pt states that she would like to see Dr Acie Fredrickson today, for complaint and to advise on diuresis.  Informed the pt that Dr Acie Fredrickson is out of the office today, but I can offer for her to see an Extender in our office today.  Offered the pt an appt with Ellen Henri PA-C for today at 2 pm.  Advised the pt to arrive 15 mins prior too this appt. Informed the pt that I will route this message to Dr Acie Fredrickson and RN for review, upon return to the office.  Pt verbalized understanding and agrees with this plan.  Pt gracious for all the assistance provided.

## 2016-09-25 NOTE — Patient Instructions (Addendum)
Medication Instructions:   STOP TAKING BYSTOLIC  START TAKING LASIX 40 MG ONCE A DAY   START TAKING POTASSIUM 10 MEQ ONCE A DAY   If you need a refill on your cardiac medications before your next appointment, please call your pharmacy.  Labwork: BNP BMET AND CBC TODAY   Testing/Procedures: NONE ORDERED  TODAY    Follow-Up: IN 1 TO 3 WEEKS WITH  AVAILABLE APP OR SIMMONS   Any Other Special Instructions Will Be Listed Below (If Applicable).

## 2016-09-25 NOTE — Telephone Encounter (Signed)
Natalie Ho is calling because she has had a bad fall , she cannot sit and she think that she has fluid build up and she thinks she has gained 10lbs in week and half . Also she is having some trouble breathing  . Please call    Thanks

## 2016-09-25 NOTE — Progress Notes (Signed)
09/25/2016 Natalie Ho   [birth year]  [id number]  Primary Physician Chesley Noon, MD Primary Cardiologist: Dr. Acie Fredrickson    Reason for Visit/CC: Add on for DOE and weight gain  HPI:  Natalie Ho is a 83 y.o. female, followed by Dr. Acie Fredrickson, who presents to clinic today, as an add-on, given complaints of exertional dyspnea and weight gain. This is after her lasix was discontinued by Dr. Acie Fredrickson 08/16/16 given hypotension and near syncope. Additional PMH includes chronic afib, remote stroke, chronic a/c with Xarelto, AI, HTN and HLD.   Her BP at her last OV 08/16/16 was 100/70. Her last 2D echo 08/2015 showed normal LVEF at 55-60% and mild AI. She was also noted at that time to have moderate pulmonary HTN. It was then that she was started on Lasix 40 mg daily.   Her only current medication that affects her BP is Bystolic. She requires this for rate control management of her afib. She is on low dose 2.5 mg. Pt reports that his has also been reduced due to borderline hypotension.   Pt also reporst mechanic fall 1 week ago. She hurt her left hip. She went to her orthopedist for imaging, which was negative for fracture but she was noted to have a large left sided hip hematoma. Since then, she has been taking things easy. Family friends have been helping out, bringing her and her husband dinner. They note some of the foods have been "salty" (soup and sauerkraut). Subsequently, she has had increased dyspnea and weigh gain, 9-10 lb over the last week. Her BP also increased the other day, up in to the 875I systolic. She tried restarting lasix, but at a lower dose of 20 mg daily. She has not noticed any significant improvement in symptoms with the low dose. She also notes fluctuating BP. In the past week, she has had some recurrent hypotension with SBP in the mid 80s, however she denies any syncope/ near syncope. No weakness, fatigue or dizziness. No symptoms with positional changes and no recurrent falls.    Her BP today is 112/60. EKG shows chronic afib with HR in the low 70s. She is asymptomatic at rest.   Current Meds  Medication Sig  . aspirin EC 81 MG tablet Take 81 mg by mouth daily.  Marland Kitchen atorvastatin (LIPITOR) 20 MG tablet Take 1 tablet (20 mg total) by mouth at bedtime.  . Calcium Citrate-Vitamin D (CITRACAL + D PO) Take 4 tablets by mouth daily.  . cholecalciferol (VITAMIN D) 1000 UNITS tablet Take 1,000 Units by mouth daily.  Marland Kitchen escitalopram (LEXAPRO) 10 MG tablet Take 10 mg by mouth daily.  . furosemide (LASIX) 40 MG tablet Take 1 tablet (40 mg total) by mouth daily.  Marland Kitchen latanoprost (XALATAN) 0.005 % ophthalmic solution Place 1-2 drops into the left eye daily.   Marland Kitchen nystatin-triamcinolone (MYCOLOG II) cream Apply 1 application topically daily as needed (fungus). On fungus  . potassium chloride (K-DUR) 10 MEQ tablet Take 1 tablet (10 mEq total) by mouth daily.  Marland Kitchen SIMBRINZA 1-0.2 % SUSP Place 1 drop into the right eye 3 (three) times daily.   Marland Kitchen umeclidinium-vilanterol (ANORO ELLIPTA) 62.5-25 MCG/INH AEPB Inhale 1 puff, once dialy  . XARELTO 20 MG TABS tablet TAKE 1 TABLET BY MOUTH DAILY WITH SUPPER  . [DISCONTINUED] furosemide (LASIX) 40 MG tablet Take 20 mg by mouth daily.  . [DISCONTINUED] nebivolol (BYSTOLIC) 2.5 MG tablet Take 1 tablet (2.5 mg total) by mouth daily.  . [DISCONTINUED] potassium chloride (  K-DUR) 10 MEQ tablet Take 10 mEq by mouth daily.   Allergies  Allergen Reactions  . Benzonatate Other (See Comments)    Unknown reaction  . Shrimp [Shellfish Allergy] Anaphylaxis and Swelling  . Alendronate Sodium Other (See Comments)    Eyes and head hurt  . Dabigatran Etexilate Mesylate Other (See Comments)    Upset stomach"Pradaxa"  . Diamox [Acetazolamide]     Excessive sleepiness  . Levofloxacin Other (See Comments)    "made her funny in the head"  . Pradaxa [Dabigatran Etexilate Mesylate] Other (See Comments)    "sick to my stomach and lost weight"  . Sulfonamide  Derivatives Other (See Comments)    Unknown reaction  . Tramadol Other (See Comments)    "made me feel funny in my head"   Past Medical History:  Diagnosis Date  . Anxiety   . Aortic insufficiency   . Arthritis   . Atrial fibrillation ([place])   . Chronic anticoagulation   . Colitis   . GERD (gastroesophageal reflux disease)   . Glaucoma    both eyes  . Gout   . Hyperlipidemia   . Hypertension   . Melanoma of nose ([place])    "was only a precancer area"-no problems since  . SOB (shortness of breath)   . Stroke [facility])     2007 and  TIA-3'08, none recent  . TIA (transient ischemic attack)   . Tinnitus of both ears    Family History  Problem Relation Age of Onset  . Heart disease Mother    Past Surgical History:  Procedure Laterality Date  . BLEPHAROPLASTY     bilateral  . CARDIOVERSION     Unsuccessful  . cataracts     bilateral  . CHOLECYSTECTOMY N/A 02/26/2013   Procedure: LAPAROSCOPIC CHOLECYSTECTOMY WITH INTRAOPERATIVE CHOLANGIOGRAM;  Surgeon: Edward Jolly, MD;  Location: [facility];  Service: General;  Laterality: N/A;  . colon polyps     removed via colonoscopy  . eye surgeries     Multiple eye surgeries for torned retina-bilateral  . FRACTURE SURGERY     ORIF-Lt. hip -,removed hardware  . TONSILLECTOMY    . TOTAL HIP ARTHROPLASTY  04/03/2012   Procedure: TOTAL HIP ARTHROPLASTY ANTERIOR APPROACH;  Surgeon: Gearlean Alf, MD;  Location: [facility];  Service: Orthopedics;  Laterality: Left;   Social History   Social History  . Marital status: Married    Spouse name: N/A  . Number of children: N/A  . Years of education: N/A   Occupational History  . Not on file.   Social History Main Topics  . Smoking status: Former Smoker    Packs/day: 0.50    Years: 25.00    Types: Cigarettes    Quit date: 03/13/1990  . Smokeless tobacco: Never Used  . Alcohol use 2.5 oz/week    5 drink(s) per week     Comment: wine daily  . Drug use: No  . Sexual activity: Not  Currently   Other Topics Concern  . Not on file   Social History Narrative  . No narrative on file     Review of Systems: General: negative for chills, fever, night sweats or weight changes.  Cardiovascular: negative for chest pain, dyspnea on exertion, edema, orthopnea, palpitations, paroxysmal nocturnal dyspnea or shortness of breath Dermatological: negative for rash Respiratory: negative for cough or wheezing Urologic: negative for hematuria Abdominal: negative for nausea, vomiting, diarrhea, bright red blood per rectum, melena, or hematemesis Neurologic: negative for visual  changes, syncope, or dizziness All other systems reviewed and are otherwise negative except as noted above.   Physical Exam:  Blood pressure 112/60, pulse 74, height 5' 7.5" (1.715 m), weight 140 lb 12.8 oz (63.9 kg).  General appearance: alert, cooperative and no distress Neck: no carotid bruit and no JVD Lungs: clear to auscultation bilaterally Heart: irregularly irregular rhythm and regular rate Extremities: trace bilatearl LEE Pulses: 2+ and symmetric Skin: Skin color, texture, turgor normal. No rashes or lesions Neurologic: Grossly normal  EKG chronic afib, 74 bpm -- personally reviewed   ASSESSMENT AND PLAN:   1. Acute on Chronic Diastolic CHF, Complicated by Hypotension: in the setting of pulmonary HTN, recent discontinuation of diuretics due to soft BP/ hypotension and dietary indiscretion with sodium. She is asymptomatic at rest but notes DOE and 9-10 lb weight gain in the last week. Her BP has been an issue and has limited use of diuretics. Clinic BP currently 112/60 and HR 74 bpm. She is on low dose Bystolic. I have reviewed case with Dr. Marlou Porch, DOD. Given her need for diuretics to prevent worsening CHF/ pulmonary edema, we will stop her Bystolic to allow more room in BP. Add back Lasix 40 mg daily + KDur 10 mEq daily. Avoid salt. Monitor BP closely at home and use caution with positional  changes. She will call our office if any recurrent hypotension or increase in HR. We will also check a CBC today to r/o significant acute blood loss anemia which may be contributing to her hypotension, given her hematoma 2/2 to recent mechanical fall while on full dose anticoagulation (pt denies head trauma). Will also obtain a BNP today to assess degree of CHF and BMP for monitoring of renal function and K. We will have her f/u with an APP in 1-2 weeks for repeat assessment. If significant increase in her HR, may consider change to low dose metoprolol for rate control, which may have less affect on BP compared to Bystolic. As outlined above, this plan was discussed and made along with Dr. Marlou Porch, DOD. The patient and her husband are also in agreement with plan.      Natalie Ho, MHS [facility] 09/25/2016 5:28 PM

## 2016-09-26 LAB — CBC
HEMATOCRIT: 29.8 % — AB (ref 34.0–46.6)
HEMOGLOBIN: 9.8 g/dL — AB (ref 11.1–15.9)
MCH: 30.8 pg (ref 26.6–33.0)
MCHC: 32.9 g/dL (ref 31.5–35.7)
MCV: 94 fL (ref 79–97)
Platelets: 233 10*3/uL (ref 150–379)
RBC: 3.18 x10E6/uL — ABNORMAL LOW (ref 3.77–5.28)
RDW: 14.5 % (ref 12.3–15.4)
WBC: 4.4 10*3/uL (ref 3.4–10.8)

## 2016-09-26 LAB — BASIC METABOLIC PANEL
BUN/Creatinine Ratio: 17 (ref 12–28)
BUN: 13 mg/dL (ref 8–27)
CALCIUM: 8.6 mg/dL — AB (ref 8.7–10.3)
CO2: 25 mmol/L (ref 20–29)
CREATININE: 0.77 mg/dL (ref 0.57–1.00)
Chloride: 94 mmol/L — ABNORMAL LOW (ref 96–106)
GFR, EST AFRICAN AMERICAN: 82 mL/min/{1.73_m2} (ref >59)
GFR, EST NON AFRICAN AMERICAN: 71 mL/min/{1.73_m2} (ref >59)
Glucose: 93 mg/dL (ref 65–99)
Potassium: 4.8 mmol/L (ref 3.5–5.2)
Sodium: 132 mmol/L — ABNORMAL LOW (ref 134–144)

## 2016-09-26 LAB — PRO B NATRIURETIC PEPTIDE: NT-Pro BNP: 1377 pg/mL — ABNORMAL HIGH (ref 0–738)

## 2016-10-18 ENCOUNTER — Encounter: Payer: Self-pay | Admitting: Cardiology

## 2016-10-18 ENCOUNTER — Ambulatory Visit (INDEPENDENT_AMBULATORY_CARE_PROVIDER_SITE_OTHER): Payer: Medicare Other | Admitting: Cardiology

## 2016-10-18 VITALS — BP 116/58 | HR 77 | Ht 67.5 in | Wt 134.0 lb

## 2016-10-18 DIAGNOSIS — I482 Chronic atrial fibrillation, unspecified: Secondary | ICD-10-CM

## 2016-10-18 DIAGNOSIS — R0609 Other forms of dyspnea: Secondary | ICD-10-CM | POA: Diagnosis not present

## 2016-10-18 DIAGNOSIS — I10 Essential (primary) hypertension: Secondary | ICD-10-CM

## 2016-10-18 DIAGNOSIS — I5032 Chronic diastolic (congestive) heart failure: Secondary | ICD-10-CM | POA: Diagnosis not present

## 2016-10-18 LAB — CBC
HEMOGLOBIN: 13.4 g/dL (ref 11.1–15.9)
Hematocrit: 41.1 % (ref 34.0–46.6)
MCH: 31.2 pg (ref 26.6–33.0)
MCHC: 32.6 g/dL (ref 31.5–35.7)
MCV: 96 fL (ref 79–97)
Platelets: 202 10*3/uL (ref 150–379)
RBC: 4.3 x10E6/uL (ref 3.77–5.28)
RDW: 14.5 % (ref 12.3–15.4)
WBC: 3.1 10*3/uL — ABNORMAL LOW (ref 3.4–10.8)

## 2016-10-18 LAB — PRO B NATRIURETIC PEPTIDE: NT-Pro BNP: 1111 pg/mL — ABNORMAL HIGH (ref 0–738)

## 2016-10-18 LAB — BASIC METABOLIC PANEL
BUN/Creatinine Ratio: 26 (ref 12–28)
BUN: 19 mg/dL (ref 8–27)
CALCIUM: 9.3 mg/dL (ref 8.7–10.3)
CO2: 26 mmol/L (ref 20–29)
Chloride: 96 mmol/L (ref 96–106)
Creatinine, Ser: 0.72 mg/dL (ref 0.57–1.00)
GFR, EST AFRICAN AMERICAN: 89 mL/min/{1.73_m2} (ref >59)
GFR, EST NON AFRICAN AMERICAN: 77 mL/min/{1.73_m2} (ref >59)
Glucose: 78 mg/dL (ref 65–99)
Potassium: 4.4 mmol/L (ref 3.5–5.2)
Sodium: 138 mmol/L (ref 134–144)

## 2016-10-18 MED ORDER — NEBIVOLOL HCL 2.5 MG PO TABS: 2.5000 mg | ORAL_TABLET | Freq: Every day | ORAL | 0 refills | Status: DC

## 2016-10-18 MED ORDER — POTASSIUM CHLORIDE ER 10 MEQ PO TBCR: 10.0000 meq | EXTENDED_RELEASE_TABLET | ORAL | 3 refills | Status: DC | PRN

## 2016-10-18 MED ORDER — FUROSEMIDE 40 MG PO TABS: 40.0000 mg | ORAL_TABLET | ORAL | 3 refills | Status: DC | PRN

## 2016-10-18 NOTE — Patient Instructions (Addendum)
Medication Instructions:  Your physician has recommended you make the following change in your medication:  1.  START taking the Lasix only as needed for wt gain: 3 lbs in 24 hours or 5 lbs in a week 2.  START taking the Potassium only as needed:  ONLY TAKE WHEN YOU HAVE TO TAKE LASIX 3.  RESTART the Bystolic 2.5 mg daily   Labwork: TODAY:  CBC, PRO BNP, & BMET  Testing/Procedures: None ordered  Follow-Up: Your physician recommends that you schedule a follow-up appointment in: SEE DR. NAHSER AS PLANNED    Any Other Special Instructions Will Be Listed Below (If Applicable).     If you need a refill on your cardiac medications before your next appointment, please call your pharmacy.

## 2016-10-18 NOTE — Progress Notes (Signed)
10/18/2016 Natalie Ho   December 29, 1930  [id number]  Primary Physician Chesley Noon, MD Primary Cardiologist: Dr. Cathie Olden   Reason for Visit/CC: F/u for Acute on Chronic Diastolic HF and Anemia  HPI:  Natalie Ho is a 83 y.o. female, followed by Dr. Acie Fredrickson, who presents to clinic today for follow-up.  I evaluated her on 09/25/2016 after she was added on to my schedule given complaints of exertional dyspnea and weight gain. This was after her lasix was discontinued by Dr. Acie Fredrickson 08/16/16 given hypotension and near syncope. Additional PMH includes chronic afib, remote stroke, chronic a/c with Xarelto, AI, HTN and HLD.   Her BP at her last OV with Dr. Acie Fredrickson 08/16/16 was 100/70. Her last 2D echo 08/2015 showed normal LVEF at 55-60% and mild AI. She was also noted at that time to have moderate pulmonary HTN. It was then that she was started on Lasix 40 mg daily.   When I evaluated her on July 16, she had reported a mechanical fall one week prior.  She hurt her left hip. She went to her orthopedist for imaging, which was negative for fracture but she was noted to have a large left sided hip hematoma. Afterwards she had been taking things easy. Family friends were helping out, bringing her and her husband dinner. They noted that some of the foods were very "salty" (soup and sauerkraut). Subsequently, she had increased dyspnea and weigh gain, 9-10 lb over the course of a week. Her BP also increased, up in to the 062I systolic. She tried restarting lasix, but at a lower dose of 20 mg daily. She had not noticed any significant improvement in symptoms with the low dose. She also noted fluctuating BP with some occasional hypotension, however she denied any syncope/ near syncope. No weakness, fatigue or dizziness. No symptoms with positional changes and no recurrent falls.   Her blood pressure in clinic that day was 112/60 and her EKG showed chronic A. fib with a heart rate in the low 70s. She was  asymptomatic at rest. I discussed plan with Dr. Marlou Porch who was DOD. Given her need for diuretics to prevent worsening CHF/ pulmonary edema, we decided to stop her Bystolic to allow more room in BP. We added back Lasix 40 mg daily + KDur 10 mEq daily. Labs were obtained that visit and her BNP was markedly elevated at 1,377. Renal function and potassium were within normal limits. She was also noted to be anemic with drop in hemoglobin from 13-9. This was felt likely secondary to her hematoma she developed after her fall while on anticoagulation therapy for A. Fib. Dr. Marlou Porch had recommended adding metoprolol if she noted any increase in HR, as this would have a lesser affect on BP vs Bystolic.  She presents back to clinic today for follow-up to reassess volume status, response to Lasix, blood pressure as well as to obtain repeat laboratory work to reassess BNP level, renal function and electrolytes as well as to reassess her anemia to make sure she has had no further drops in her hemoglobin.  She is here today with her husband. Her office weight today is 134 lb, compared to 140 lb at her last OV. Her BP is stable at 116/58. Pulse rate is stable at 77 bpm. She reports significant improvement. Her edema has resolved. No recurrent dyspnea. She notes her weight is back down to her usual baseline. She has avoided salt. She has concerns b/c she feels like her HR has increased  some since stopping her Bystolic. Her hip hematoma as nearly resolved. She denies any recurrent falls.   Current Meds  Medication Sig  . aspirin EC 81 MG tablet Take 81 mg by mouth daily.  Marland Kitchen atorvastatin (LIPITOR) 20 MG tablet Take 1 tablet (20 mg total) by mouth at bedtime.  . Calcium Citrate-Vitamin D (CITRACAL + D PO) Take 4 tablets by mouth daily.  . cholecalciferol (VITAMIN D) 1000 UNITS tablet Take 1,000 Units by mouth daily.  Marland Kitchen escitalopram (LEXAPRO) 10 MG tablet Take 10 mg by mouth daily.  Marland Kitchen latanoprost (XALATAN) 0.005 % ophthalmic  solution Place 1-2 drops into the left eye daily.   Mckinley Jewel Dimesylate (RHOPRESSA OP) Apply to eye daily.  Marland Kitchen nystatin-triamcinolone (MYCOLOG II) cream Apply 1 application topically daily as needed (fungus). On fungus  . PREDNISOLONE ACETATE OP Apply 1 drop to eye daily.  Marland Kitchen SIMBRINZA 1-0.2 % SUSP Place 1 drop into the right eye 3 (three) times daily.   . timolol (TIMOPTIC) 0.5 % ophthalmic solution Place 1 drop into both eyes 2 (two) times daily.  Marland Kitchen umeclidinium-vilanterol (ANORO ELLIPTA) 62.5-25 MCG/INH AEPB Inhale 1 puff, once dialy  . XARELTO 20 MG TABS tablet TAKE 1 TABLET BY MOUTH DAILY WITH SUPPER  . [DISCONTINUED] furosemide (LASIX) 40 MG tablet Take 1 tablet (40 mg total) by mouth daily.  . [DISCONTINUED] potassium chloride (K-DUR) 10 MEQ tablet Take 1 tablet (10 mEq total) by mouth daily.   Allergies  Allergen Reactions  . Benzonatate Other (See Comments)    Unknown reaction  . Shrimp [Shellfish Allergy] Anaphylaxis and Swelling  . Alendronate Sodium Other (See Comments)    Eyes and head hurt  . Dabigatran Etexilate Mesylate Other (See Comments)    Upset stomach"Pradaxa"  . Diamox [Acetazolamide]     Excessive sleepiness  . Levofloxacin Other (See Comments)    "made her funny in the head"  . Pradaxa [Dabigatran Etexilate Mesylate] Other (See Comments)    "sick to my stomach and lost weight"  . Sulfonamide Derivatives Other (See Comments)    Unknown reaction  . Tramadol Other (See Comments)    "made me feel funny in my head"   Past Medical History:  Diagnosis Date  . Anxiety   . Aortic insufficiency   . Arthritis   . Atrial fibrillation ([place])   . Chronic anticoagulation   . Colitis   . GERD (gastroesophageal reflux disease)   . Glaucoma    both eyes  . Gout   . Hyperlipidemia   . Hypertension   . Melanoma of nose ([place])    "was only a precancer area"-no problems since  . SOB (shortness of breath)   . Stroke [facility])     2007 and  TIA-3'08, none recent  . TIA  (transient ischemic attack)   . Tinnitus of both ears    Family History  Problem Relation Age of Onset  . Heart disease Mother    Past Surgical History:  Procedure Laterality Date  . BLEPHAROPLASTY     bilateral  . CARDIOVERSION     Unsuccessful  . cataracts     bilateral  . CHOLECYSTECTOMY N/A 02/26/2013   Procedure: LAPAROSCOPIC CHOLECYSTECTOMY WITH INTRAOPERATIVE CHOLANGIOGRAM;  Surgeon: Edward Jolly, MD;  Location: [facility];  Service: General;  Laterality: N/A;  . colon polyps     removed via colonoscopy  . eye surgeries     Multiple eye surgeries for torned retina-bilateral  . FRACTURE SURGERY     ORIF-Lt.  hip -,removed hardware  . TONSILLECTOMY    . TOTAL HIP ARTHROPLASTY  04/03/2012   Procedure: TOTAL HIP ARTHROPLASTY ANTERIOR APPROACH;  Surgeon: Gearlean Alf, MD;  Location: [facility];  Service: Orthopedics;  Laterality: Left;   Social History   Social History  . Marital status: Married    Spouse name: N/A  . Number of children: N/A  . Years of education: N/A   Occupational History  . Not on file.   Social History Main Topics  . Smoking status: Former Smoker    Packs/day: 0.50    Years: 25.00    Types: Cigarettes    Quit date: 03/13/1990  . Smokeless tobacco: Never Used  . Alcohol use 2.5 oz/week    5 drink(s) per week     Comment: wine daily  . Drug use: No  . Sexual activity: Not Currently   Other Topics Concern  . Not on file   Social History Narrative  . No narrative on file     Review of Systems: General: negative for chills, fever, night sweats or weight changes.  Cardiovascular: negative for chest pain, dyspnea on exertion, edema, orthopnea, palpitations, paroxysmal nocturnal dyspnea or shortness of breath Dermatological: negative for rash Respiratory: negative for cough or wheezing Urologic: negative for hematuria Abdominal: negative for nausea, vomiting, diarrhea, bright red blood per rectum, melena, or hematemesis Neurologic:  negative for visual changes, syncope, or dizziness All other systems reviewed and are otherwise negative except as noted above.   Physical Exam:  Blood pressure (!) 116/58, pulse 77, height 5' 7.5" (1.715 m), weight 134 lb (60.8 kg), SpO2 98 %.  General appearance: alert, cooperative and no distress Neck: no carotid bruit and no JVD Lungs: clear to auscultation bilaterally Heart: irregularly irregular rhythm Extremities: extremities normal, atraumatic, no cyanosis or edema Pulses: 2+ and symmetric Skin: Skin color, texture, turgor normal. No rashes or lesions Neurologic: Alert and oriented X 3, normal strength and tone. Normal symmetric reflexes. Normal coordination and gait  EKG not performed -- personally reviewed   ASSESSMENT AND PLAN:   1. Acute on Chronic Diastolic HF: recent exacerbation was in the setting of discontinuation of Lasix given orthostatic hypotension as well as dietary indiscretion with sodium. BNP was markedly elevated at 1,300. 40 mg of Lasix daily was restarted. Pt notes significant subjective improvement. Also her weight is back down to her baseline, from 140 lb>>134 lb. She is euvoluemic on exam. We will recheck BNP and BMP today. Given prior issues with orthostatic hypotension, we will go back to PRN Lasix and K based on weight gain of > 3 lb in 24 hrs or > 5 lb in 1 week. Pt was instructed to avoid salt.   2. Chronic atrial fibrillation: Rate is currently controlled in clinic today however the patient reports slight increases in her baseline heart rate while at home and wishes to go back on her Bystolic. We were restart low-dose 2.5 mg daily. Continue Xarelto for anticoagulation.  3. Anemia: Recent CBC showed drop in hemoglobin from 13-9. This was likely secondary to her hip hematoma she developed after a mechanical fall while on anticoagulation with Xarelto. We will recheck a CBC today to ensure no further drops in hemoglobin. She denies any recurrent falls.   4.  H/o Orthostatic Hypotension: BP has been stable at home. No symptoms. Change lasix back to PRN use daily. Restart low dose Bystolic. Pt advised to continue to use caution with positional changes.   Follow-Up: continue routine f/u  with Dr. Acie Fredrickson as directed.   Whitney Hillegass Ladoris Gene, MHS [facility] 10/18/2016 9:41 AM

## 2016-11-27 ENCOUNTER — Ambulatory Visit (INDEPENDENT_AMBULATORY_CARE_PROVIDER_SITE_OTHER): Payer: Medicare Other

## 2016-11-27 DIAGNOSIS — I4891 Unspecified atrial fibrillation: Secondary | ICD-10-CM | POA: Diagnosis not present

## 2016-11-27 DIAGNOSIS — Z5181 Encounter for therapeutic drug level monitoring: Secondary | ICD-10-CM

## 2016-11-27 LAB — BASIC METABOLIC PANEL
BUN/Creatinine Ratio: 23 (ref 12–28)
BUN: 19 mg/dL (ref 8–27)
CALCIUM: 8.8 mg/dL (ref 8.7–10.3)
CO2: 23 mmol/L (ref 20–29)
Chloride: 104 mmol/L (ref 96–106)
Creatinine, Ser: 0.83 mg/dL (ref 0.57–1.00)
GFR calc Af Amer: 75 mL/min/{1.73_m2} (ref >59)
GFR, EST NON AFRICAN AMERICAN: 65 mL/min/{1.73_m2} (ref >59)
Glucose: 77 mg/dL (ref 65–99)
POTASSIUM: 3.9 mmol/L (ref 3.5–5.2)
Sodium: 140 mmol/L (ref 134–144)

## 2016-11-27 LAB — CBC
HEMATOCRIT: 39.6 % (ref 34.0–46.6)
Hemoglobin: 12.8 g/dL (ref 11.1–15.9)
MCH: 30.1 pg (ref 26.6–33.0)
MCHC: 32.3 g/dL (ref 31.5–35.7)
MCV: 93 fL (ref 79–97)
NRBC: 0 % (ref 0–0)
PLATELETS: 226 10*3/uL (ref 150–379)
RBC: 4.25 x10E6/uL (ref 3.77–5.28)
RDW: 13.8 % (ref 12.3–15.4)
WBC: 3 10*3/uL — AB (ref 3.4–10.8)

## 2016-11-27 NOTE — Progress Notes (Signed)
Pt was started on Xarelto 20mg  QD by Dr Acie Fredrickson for afib on 07/25/15.    Reviewed patients medication list.  Pt is not currently on any combined P-gp and strong CYP3A4 inhibitors/inducers (ketoconazole, traconazole, ritonavir, carbamazepine, phenytoin, rifampin, St. John's wort).  Reviewed labs.  SCr 0.83 on 11/27/16, Weight 62.7kg, CrCl-49.86 .  Dose appropriate based on CrCl if rounded up.  CrCl has been borderline, discussed with [place] PharmD will have pt continue on Xarelto 20mg  QD and recheck CBC and BMP in 3 months again. Hgb and HCT Within Normal Limits 12.8/39.6 on 11/27/16.  Discussed with pt, pt had labwork on 10/18/16 Creat 0.72, weight 60.8kg, age 84, CrCl 55.83 which is a sl improvement from 2 months ago.  Advised pt we could use this bloodwork, but pt expressed concern about weight being up recently and increased frequency in taking prm Lasix and wishes to have BMP repeated.  Discussed with Drue Novel Dr Elmarie Shiley nurse, she is aware of pt's concerns she spoke with pt in lobby will await lab results and address.    Called spoke with pt advised to continue on Xarelto 20mg  QD and repeat CBC and BMP in 3 months on 02/27/17 at 11am.

## 2016-12-01 ENCOUNTER — Telehealth: Payer: Self-pay | Admitting: *Deleted

## 2016-12-01 NOTE — Telephone Encounter (Signed)
-----   Message from Thayer Headings, MD sent at 11/28/2016  4:38 PM EDT ----- Labs are stable

## 2016-12-06 NOTE — Telephone Encounter (Signed)
Spoke with patient and reviewed lab results with her. She states she took her lasix every day for a few days due to leg swelling. She states her leg swelling has decreased. I advised that her kidney function is normal and that she has been advised to take lasix daily for weight gain > 3 lb in one day or 5 lb in 1 week. She verbalized understanding and agreement and thanked me for the call.

## 2016-12-06 NOTE — Telephone Encounter (Signed)
Follow up  ° ° ° °Pt is returning call for lab results  °

## 2016-12-31 ENCOUNTER — Other Ambulatory Visit: Payer: Self-pay | Admitting: Cardiovascular Disease

## 2017-01-01 ENCOUNTER — Other Ambulatory Visit: Payer: Self-pay | Admitting: *Deleted

## 2017-01-01 MED ORDER — RIVAROXABAN 15 MG PO TABS: 15.0000 mg | ORAL_TABLET | Freq: Every day | ORAL | 5 refills | Status: DC

## 2017-01-01 NOTE — Telephone Encounter (Signed)
Spoke with pt and instructed that she is now to take Xarelto 15mg  daily with largest meal of the day according her CrCl  and she states understanding

## 2017-01-01 NOTE — Telephone Encounter (Signed)
Age 83 Wt 60.8kg 10/18/2016 Saw Ellen Henri  10/18/2016 11/27/2016 SrCr 0.83 11/27/2016 Hgb 12.8 Hct 39.6 CrCl 48.43 Spoke with Dr Acie Fredrickson and he states that Xarelto 15 mg daily is correct dose Refill done for Xarelto 15mg  daily  Left message on pt's home phone and mobile regarding this change and instructed to call our office so could explain the change in dose

## 2017-02-12 ENCOUNTER — Telehealth: Payer: Self-pay

## 2017-02-12 NOTE — Telephone Encounter (Signed)
Pt is aware of CY's below message and voiced her understanding. Pt has been scheduled with TP on 02/19/17 to discuss further, as pt requested apt for next week.  Nothing further is needed.

## 2017-02-12 NOTE — Telephone Encounter (Signed)
This would be an uncommon reaction to Anoro, so she might consider if she wants to go back to trying it again. Ok to see me if available, or NP

## 2017-02-12 NOTE — Telephone Encounter (Signed)
Called and spoke with pt. Pt feels that she had reaction to Anoro, as she developed vomiting, weak stomach & sore throat. Pt states symptoms have subsided, as she has been off of Anoro for one week. Pt is requesting apt with CY.   CY please advise. Thanks.    Current Outpatient Medications on File Prior to Visit  Medication Sig Dispense Refill  . aspirin EC 81 MG tablet Take 81 mg by mouth daily.    Marland Kitchen atorvastatin (LIPITOR) 20 MG tablet Take 1 tablet (20 mg total) by mouth at bedtime. 90 tablet 3  . Calcium Citrate-Vitamin D (CITRACAL + D PO) Take 4 tablets by mouth daily.    . cholecalciferol (VITAMIN D) 1000 UNITS tablet Take 1,000 Units by mouth daily.    Marland Kitchen escitalopram (LEXAPRO) 10 MG tablet Take 10 mg by mouth daily.    . furosemide (LASIX) 40 MG tablet Take 1 tablet (40 mg total) by mouth as needed for fluid (WT GAIN 3 LBS IN 1 DAY OR 5 LBS IN 1 WEEK). 90 tablet 3  . latanoprost (XALATAN) 0.005 % ophthalmic solution Place 1-2 drops into the left eye daily.     . nebivolol (BYSTOLIC) 2.5 MG tablet Take 1 tablet (2.5 mg total) by mouth daily. 30 tablet 0  . Netarsudil Dimesylate (RHOPRESSA OP) Apply to eye daily.    Marland Kitchen nystatin-triamcinolone (MYCOLOG II) cream Apply 1 application topically daily as needed (fungus). On fungus    . potassium chloride (K-DUR) 10 MEQ tablet Take 1 tablet (10 mEq total) by mouth as needed (WHEN TAKING LASIX). 90 tablet 3  . PREDNISOLONE ACETATE OP Apply 1 drop to eye daily.    . Rivaroxaban (XARELTO) 15 MG TABS tablet Take 1 tablet (15 mg total) by mouth daily with supper. 30 tablet 5  . SIMBRINZA 1-0.2 % SUSP Place 1 drop into the right eye 3 (three) times daily.   0  . timolol (TIMOPTIC) 0.5 % ophthalmic solution Place 1 drop into both eyes 2 (two) times daily.  2  . umeclidinium-vilanterol (ANORO ELLIPTA) 62.5-25 MCG/INH AEPB Inhale 1 puff, once dialy 60 each 12   No current facility-administered medications on file prior to visit.     Allergies   Allergen Reactions  . Benzonatate Other (See Comments)    Unknown reaction  . Shrimp [Shellfish Allergy] Anaphylaxis and Swelling  . Alendronate Sodium Other (See Comments)    Eyes and head hurt  . Dabigatran Etexilate Mesylate Other (See Comments)    Upset stomach"Pradaxa"  . Diamox [Acetazolamide]     Excessive sleepiness  . Levofloxacin Other (See Comments)    "made her funny in the head"  . Pradaxa [Dabigatran Etexilate Mesylate] Other (See Comments)    "sick to my stomach and lost weight"  . Sulfonamide Derivatives Other (See Comments)    Unknown reaction  . Tramadol Other (See Comments)    "made me feel funny in my head"

## 2017-02-14 ENCOUNTER — Encounter: Payer: Self-pay | Admitting: Cardiovascular Disease

## 2017-02-14 ENCOUNTER — Ambulatory Visit (INDEPENDENT_AMBULATORY_CARE_PROVIDER_SITE_OTHER): Payer: Medicare Other | Admitting: Cardiovascular Disease

## 2017-02-14 VITALS — BP 120/66 | HR 81 | Ht 67.0 in | Wt 133.8 lb

## 2017-02-14 DIAGNOSIS — I482 Chronic atrial fibrillation, unspecified: Secondary | ICD-10-CM

## 2017-02-14 DIAGNOSIS — I10 Essential (primary) hypertension: Secondary | ICD-10-CM

## 2017-02-14 NOTE — Patient Instructions (Signed)
Your physician recommends that you continue on your current medications as directed. Please refer to the Current Medication list given to you today.  Your physician wants you to follow-up in: 6 MONTHS WITH DR NAHSER  You will receive a reminder letter in the mail two months in advance. If you don't receive a letter, please call our office to schedule the follow-up appointment.  

## 2017-02-14 NOTE — Progress Notes (Signed)
Natalie Ho  Date of Birth  01/05/1931 [facility] [address]    [place], [place]  [address] [phone]  Fax  [phone]  Problems: 1. Atrial fibrillation 2. Remote stroke 3. Aortic insufficiency 4. Hypertension 5. Hypercholesterolemia  History of Present Illness:  83 yo Hx of a-Fib, remote stroke, HTN, hypercholesterolemia.  Her last visit we added amlodipine 2.5 mg a day. She also started taking her Benicar 20 mg twice a day instead of 40 mg at once. She is feeling quite a bit better. Her blood pressure readings have stabilized. She still occasionally has some signs of mild hypertension with a systolic blood pressure 767.      She also has episodes where her blood pressure will become fairly low especially at night. They're given some nights when she has held her medication for several hours because her blood pressure was in the 90 range.  She's not been limited by chest pain or shortness of breath.  She has been having some problems with hypertension.  We added amlodipine 2.5 mg at her last visit.  Her blood pressures have been better.    Jul 11, 2012:  She has done well after her left hip replacement.   She has some dyspnea but is overall doing well.   She has stopped her Benicar and is feeling much better.  She was having low BP readings.   September 07, 2014   Natalie Ho is doing well .  No CP .  Mild dyspnea with exercise.  Jul 28, 2015:  Has some DOE with mowing the grass and vacuuming . Echo in 2014 shows normal LV function. She did have moderate pulmonary HTN ( PA pressure of 46)  BP has been ok at home. A bit elevated here.  Has a chronic cough  Records from Dr. Melford Aase were reviewed.   Labs look great.   Nov. 30, 2017:  Natalie Ho is seen today for follow up of her atrial fib and pulmonary HTN. Has shortness of breath with exertion .  The lasix has not helped.  Has seen a pulmonologist .  August 16, 2016:    Natalie Ho has had some low BP readings,   Has had presyncope Has shingles in her right eye  Was started on Anoro ( inhaler) and is breathing much better.   Dec. 5, 2018: Natalie Ho is feeling well. Is losing the vision in her right eye.   is on Anora ( new inhaler ) has mild pulmonary HTN by echo  Has greatly reduced her salt intake    Current Outpatient Medications on File Prior to Visit  Medication Sig Dispense Refill  . aspirin EC 81 MG tablet Take 81 mg by mouth daily.    Marland Kitchen atorvastatin (LIPITOR) 20 MG tablet Take 1 tablet (20 mg total) by mouth at bedtime. 90 tablet 3  . Calcium Citrate-Vitamin D (CITRACAL + D PO) Take 4 tablets by mouth daily.    . cholecalciferol (VITAMIN D) 1000 UNITS tablet Take 1,000 Units by mouth daily.    Marland Kitchen escitalopram (LEXAPRO) 10 MG tablet Take 10 mg by mouth daily.    Marland Kitchen latanoprost (XALATAN) 0.005 % ophthalmic solution Place 1-2 drops into the left eye daily.     . nebivolol (BYSTOLIC) 2.5 MG tablet Take 1 tablet (2.5 mg total) by mouth daily. 30 tablet 0  . Netarsudil Dimesylate (RHOPRESSA OP) Apply to eye daily.    Marland Kitchen nystatin-triamcinolone (MYCOLOG II) cream Apply 1 application topically daily  as needed (fungus). On fungus    . PREDNISOLONE ACETATE OP Apply 1 drop to eye daily.    . Rivaroxaban (XARELTO) 15 MG TABS tablet Take 1 tablet (15 mg total) by mouth daily with supper. 30 tablet 5  . SIMBRINZA 1-0.2 % SUSP Place 1 drop into the right eye 3 (three) times daily.   0  . timolol (TIMOPTIC) 0.5 % ophthalmic solution Place 1 drop into both eyes 2 (two) times daily.  2  . umeclidinium-vilanterol (ANORO ELLIPTA) 62.5-25 MCG/INH AEPB Inhale 1 puff, once dialy 60 each 12  . furosemide (LASIX) 40 MG tablet Take 1 tablet (40 mg total) by mouth as needed for fluid (WT GAIN 3 LBS IN 1 DAY OR 5 LBS IN 1 WEEK). 90 tablet 3  . potassium chloride (K-DUR) 10 MEQ tablet Take 1 tablet (10 mEq total) by mouth as needed (WHEN TAKING LASIX). 90 tablet 3   No current facility-administered medications on file prior  to visit.     Allergies  Allergen Reactions  . Benzonatate Other (See Comments)    Unknown reaction  . Shrimp [Shellfish Allergy] Anaphylaxis and Swelling  . Alendronate Sodium Other (See Comments)    Eyes and head hurt  . Dabigatran Etexilate Mesylate Other (See Comments)    Upset stomach"Pradaxa"  . Diamox [Acetazolamide]     Excessive sleepiness  . Levofloxacin Other (See Comments)    "made her funny in the head"  . Pradaxa [Dabigatran Etexilate Mesylate] Other (See Comments)    "sick to my stomach and lost weight"  . Sulfonamide Derivatives Other (See Comments)    Unknown reaction  . Tramadol Other (See Comments)    "made me feel funny in my head"    Past Medical History:  Diagnosis Date  . Anxiety   . Aortic insufficiency   . Arthritis   . Atrial fibrillation ([place])   . Chronic anticoagulation   . Colitis   . GERD (gastroesophageal reflux disease)   . Glaucoma    both eyes  . Gout   . Hyperlipidemia   . Hypertension   . Melanoma of nose ([place])    "was only a precancer area"-no problems since  . SOB (shortness of breath)   . Stroke [facility])     2007 and  TIA-3'08, none recent  . TIA (transient ischemic attack)   . Tinnitus of both ears     Past Surgical History:  Procedure Laterality Date  . BLEPHAROPLASTY     bilateral  . CARDIOVERSION     Unsuccessful  . cataracts     bilateral  . CHOLECYSTECTOMY N/A 02/26/2013   Procedure: LAPAROSCOPIC CHOLECYSTECTOMY WITH INTRAOPERATIVE CHOLANGIOGRAM;  Surgeon: Edward Jolly, MD;  Location: [facility] ORS;  Service: General;  Laterality: N/A;  . colon polyps     removed via colonoscopy  . eye surgeries     Multiple eye surgeries for torned retina-bilateral  . FRACTURE SURGERY     ORIF-Lt. hip -,removed hardware  . TONSILLECTOMY    . TOTAL HIP ARTHROPLASTY  04/03/2012   Procedure: TOTAL HIP ARTHROPLASTY ANTERIOR APPROACH;  Surgeon: Gearlean Alf, MD;  Location: [facility] ORS;  Service: Orthopedics;  Laterality: Left;     Social History   Tobacco Use  Smoking Status Former Smoker  . Packs/day: 0.50  . Years: 25.00  . Pack years: 12.50  . Types: Cigarettes  . Last attempt to quit: 03/13/1990  . Years since quitting: 26.9  Smokeless Tobacco Never Used    Social  History   Substance and Sexual Activity  Alcohol Use Yes  . Alcohol/week: 2.5 oz  . Types: 5 drink(s) per week   Comment: wine daily    Family History  Problem Relation Age of Onset  . Heart disease Mother     Reviw of Systems:  Reviewed in the HPI.  All other systems are negative.  Physical Exam: Blood pressure 120/66, pulse 81, height 5\' 7"  (1.702 m), weight 133 lb 12.8 oz (60.7 kg), SpO2 98 %.  GEN:  Well nourished, well developed in no acute distress HEENT: Normal NECK: No JVD; No carotid bruits LYMPHATICS: No lymphadenopathy CARDIAC: Irreg. Irreg. , soft systolic murmur, no  rubs, gallops RESPIRATORY:  Clear to auscultation without rales, wheezing or rhonchi  ABDOMEN: Soft, non-tender, non-distended MUSCULOSKELETAL:  No edema; No deformity  SKIN: Warm and dry NEUROLOGIC:  Alert and oriented x 3   ECG:       Assessment / Plan:   1. Severe DOE:  About the sam e   1. Atrial fibrillation -H fibrillation is stable.  Rate is well controlled.  2. Remote stroke-  3. Aortic insufficiency-cardiac exam is unchanged. 4. Hypertension She has cut her salt back dramatically.  Her blood pressure is well controlled. She is on Diamox -    5. Hypercholesterolemia -stable  labs from May, 2018 Total chol - 138 Trig - 38 HDL - 68 LDL - 62    Mertie Moores, MD  02/14/2017 8:44 AM    [facility] [address]  [place], [place]  [address] Pager [phone] Phone: [phone]; Fax: [phone]

## 2017-02-19 ENCOUNTER — Ambulatory Visit: Payer: Medicare Other | Admitting: Adult Health

## 2017-02-27 ENCOUNTER — Ambulatory Visit (INDEPENDENT_AMBULATORY_CARE_PROVIDER_SITE_OTHER): Payer: Medicare Other | Admitting: *Deleted

## 2017-02-27 DIAGNOSIS — I4891 Unspecified atrial fibrillation: Secondary | ICD-10-CM | POA: Diagnosis not present

## 2017-02-27 NOTE — Progress Notes (Signed)
Pt was started on Xarelto 15mg  daily for Afib on 07/28/15 by Dr. Acie Fredrickson.    Reviewed patients medication list.  Pt is not currently on any combined P-gp and strong CYP3A4 inhibitors/inducers (ketoconazole, traconazole, ritonavir, carbamazepine, phenytoin, rifampin, St. John's wort).  Reviewed labs: SCr-0.69, Weight-61.1kg, CrCl-58.54 .  Dose needs to be changed back to Xarelto 20 mg daily. This nurse Elbert Ewings spoke with Dr Acie Fredrickson about her CrCl and he states to change her back to Xarelto 20 mg daily; Hgb 13.6 and HCT 39.8  A full discussion of the nature of anticoagulants has been carried out.  A benefit/risk analysis has been presented to the patient, so that they understand the justification for choosing anticoagulation with Xarelto at this time.  The need for compliance is stressed.  Pt is aware to take the medication once daily with the largest meal of the day.  Side effects of potential bleeding are discussed, including unusual colored urine or stools, coughing up blood or coffee ground emesis, nose bleeds or serious fall or head trauma.  Discussed signs and symptoms of stroke. The patient should avoid any OTC items containing aspirin or ibuprofen.  Avoid alcohol consumption.   Call if any signs of abnormal bleeding.  Discussed financial obligations and resolved any difficulty in obtaining medication.     Pt was on Xarelto 20mg  then was switched to Xarelto 15mg  on 01/01/17 when the RX was sent in.   pt states she took all her Xarelto 20mg  before starting on the Xarelto 15mg .  02/24/17-pt taken to the lab to have CBC & BMET drawn by lab. Pt aware that we will call with lab results once resulted.  03/01/2017 Spoke with Dr Acie Fredrickson and he states she should go back to Xarelto 20 mg daily as her CrCl is  58.54 Will order Xarelto 20 mg daily Pt did state that she had taken Xarelto 15mg  daily 1 week and then Xarelto 20 mg daily next week until she finished Xarelto 20 mg  This nurse spoke with pt and  instructed to start Xarelto 20 mg daily with largest meal of the day today and she states understanding Appt made for recheck in 3 months Elbert Ewings Rn

## 2017-02-28 LAB — BASIC METABOLIC PANEL
BUN/Creatinine Ratio: 17 (ref 12–28)
BUN: 12 mg/dL (ref 8–27)
CALCIUM: 9.1 mg/dL (ref 8.7–10.3)
CO2: 25 mmol/L (ref 20–29)
Chloride: 101 mmol/L (ref 96–106)
Creatinine, Ser: 0.69 mg/dL (ref 0.57–1.00)
GFR calc non Af Amer: 80 mL/min/{1.73_m2} (ref >59)
GFR, EST AFRICAN AMERICAN: 92 mL/min/{1.73_m2} (ref >59)
GLUCOSE: 94 mg/dL (ref 65–99)
POTASSIUM: 3.8 mmol/L (ref 3.5–5.2)
Sodium: 138 mmol/L (ref 134–144)

## 2017-02-28 LAB — CBC
Hematocrit: 39.8 % (ref 34.0–46.6)
Hemoglobin: 13.6 g/dL (ref 11.1–15.9)
MCH: 32.8 pg (ref 26.6–33.0)
MCHC: 34.2 g/dL (ref 31.5–35.7)
MCV: 96 fL (ref 79–97)
PLATELETS: 211 10*3/uL (ref 150–379)
RBC: 4.15 x10E6/uL (ref 3.77–5.28)
RDW: 16.9 % — ABNORMAL HIGH (ref 12.3–15.4)
WBC: 3.1 10*3/uL — ABNORMAL LOW (ref 3.4–10.8)

## 2017-03-01 MED ORDER — RIVAROXABAN 20 MG PO TABS: 20.0000 mg | ORAL_TABLET | Freq: Every day | ORAL | 2 refills | Status: DC

## 2017-05-16 ENCOUNTER — Other Ambulatory Visit: Payer: Self-pay | Admitting: Internal Medicine

## 2017-05-21 ENCOUNTER — Other Ambulatory Visit: Payer: Self-pay | Admitting: Cardiovascular Disease

## 2017-05-21 NOTE — Telephone Encounter (Signed)
Xarelto 20mg  refill request received; pt is 84 yrs old, Wt-60.7kg, Cr-0.69 on 02/27/17, last seen by Dr. Acie Fredrickson on 02/14/17, [place].80ml/min; will send in refill request.

## 2017-06-18 ENCOUNTER — Ambulatory Visit (INDEPENDENT_AMBULATORY_CARE_PROVIDER_SITE_OTHER): Payer: Medicare Other | Admitting: *Deleted

## 2017-06-18 ENCOUNTER — Encounter (INDEPENDENT_AMBULATORY_CARE_PROVIDER_SITE_OTHER): Payer: Self-pay

## 2017-06-18 DIAGNOSIS — I4891 Unspecified atrial fibrillation: Secondary | ICD-10-CM

## 2017-06-18 LAB — BASIC METABOLIC PANEL
BUN/Creatinine Ratio: 14 (ref 12–28)
BUN: 10 mg/dL (ref 8–27)
CALCIUM: 8.9 mg/dL (ref 8.7–10.3)
CHLORIDE: 102 mmol/L (ref 96–106)
CO2: 26 mmol/L (ref 20–29)
CREATININE: 0.74 mg/dL (ref 0.57–1.00)
GFR, EST AFRICAN AMERICAN: 85 mL/min/{1.73_m2} (ref >59)
GFR, EST NON AFRICAN AMERICAN: 74 mL/min/{1.73_m2} (ref >59)
Glucose: 89 mg/dL (ref 65–99)
Potassium: 4.3 mmol/L (ref 3.5–5.2)
Sodium: 140 mmol/L (ref 134–144)

## 2017-06-18 LAB — CBC
HEMATOCRIT: 38.2 % (ref 34.0–46.6)
HEMOGLOBIN: 13 g/dL (ref 11.1–15.9)
MCH: 31.8 pg (ref 26.6–33.0)
MCHC: 34 g/dL (ref 31.5–35.7)
MCV: 93 fL (ref 79–97)
Platelets: 178 10*3/uL (ref 150–379)
RBC: 4.09 x10E6/uL (ref 3.77–5.28)
RDW: 13.8 % (ref 12.3–15.4)
WBC: 2.9 10*3/uL — ABNORMAL LOW (ref 3.4–10.8)

## 2017-06-18 NOTE — Progress Notes (Signed)
Pt was started on Xarelto 15mg  daily for Afib on 07/28/15 by Dr. Acie Fredrickson. Dose was increased by Dr. Acie Fredrickson on 03/01/17 to 20mg  QD.   Reviewed patients medication list.  Pt is not currently on any combined P-gp and strong CYP3A4 inhibitors/inducers (ketoconazole, traconazole, ritonavir, carbamazepine, phenytoin, rifampin, St. John's wort).  Reviewed labs: SCr-0.74, Weight-60.1kg, CrCl-52.23ml/min.  Dose appropriate based on CrCl.   Hgb and HCT are 13.0/38.2.   A full discussion of the nature of anticoagulants has been carried out.  A benefit/risk analysis has been presented to the patient, so that they understand the justification for choosing anticoagulation with Xarelto at this time.  The need for compliance is stressed.  Pt is aware to take the medication once daily with the largest meal of the day.  Side effects of potential bleeding are discussed, including unusual colored urine or stools, coughing up blood or coffee ground emesis, nose bleeds or serious fall or head trauma.  Discussed signs and symptoms of stroke. The patient should avoid any OTC items containing aspirin or ibuprofen.  Avoid alcohol consumption.   Call if any signs of abnormal bleeding.  Discussed financial obligations and resolved any difficulty in obtaining medication.  Next lab test in 6 months.    06/18/17-pt instructed to continue taking Xarelto 20mg  daily and 6 month appt made for follow-up.

## 2017-07-20 ENCOUNTER — Encounter: Payer: Self-pay | Admitting: Internal Medicine

## 2017-07-20 ENCOUNTER — Ambulatory Visit (INDEPENDENT_AMBULATORY_CARE_PROVIDER_SITE_OTHER): Payer: Medicare Other | Admitting: Internal Medicine

## 2017-07-20 DIAGNOSIS — J449 Chronic obstructive pulmonary disease, unspecified: Secondary | ICD-10-CM | POA: Diagnosis not present

## 2017-07-20 DIAGNOSIS — I481 Persistent atrial fibrillation: Secondary | ICD-10-CM

## 2017-07-20 DIAGNOSIS — I4819 Other persistent atrial fibrillation: Secondary | ICD-10-CM

## 2017-07-20 NOTE — Progress Notes (Signed)
HPI female former smoker followed for COPD, complicated by history lung nodule, GERD, A. fib, HBP, TIA, glaucoma   PFT: 04/20/2011-mild obstructive airways disease with insignificant response to bronchodilator, air trapping, diffusion mildly reduced. FEV1/FVC 0.63, DLCO 76%. 6 minute walk test-97%, 96%, 98%, 513 m. Well-maintained oxygenation. CT chest 04/20/12 IMPRESSION:  1. No evidence of acute pulmonary embolism.  2. Small pleural effusions, cardiomegaly, and slightly prominent  interstitial markings may indicate very mild interstitial edema.  3. Calcified mediastinal and hilar nodes with calcified right  upper lobe granuloma consistent with prior granulomatous disease.  Original Report Authenticated By: Ivar Drape, M.D PFT 02/23/2016- Severe obstructive airways disease minimal response to bronchodilator, air trapping, overinflation, diffusion reduced. Emphysema pattern. FVC 1.86/66%, FEV1 1.22/57%, ratio 0.65, RV 219%, TLC 132%, DLCO 21%  ----------------------------------------------------------- 07/20/16- 84 year old female former smoker followed for COPD,dyspnea on exertion,  complicated by history lung nodule, GERD, A. fib, HBP, TIA, glaucoma FOLLOWS FOR: Pt denies any wheezing, SOB, cough,congestion. Anoro has worked well for patient. CXR 04/25/16 IMPRESSION: 1. No acute cardiopulmonary disease. 2. Small questionable nodule versus focal infiltrate noted on the prior chest radiograph in the lateral right lung base has improved. No suspicious lung nodules. 3. Stable changes of COPD and healed granulomatous disease.  07/20/2017-84 year old female former smoker followed for COPD, DOE, complicated by history lung nodule, GERD, A. fib, HBP, TIA, Glaucoma ----COPD mixed type: Pt states she has SOB with exertion at times; otherwise doing well overall.  Anoro,  She and her husband both state and Anoro has been working really well and she has had no need for rescue inhaler.  She asks about  skipping Anoro if she does not need it.  She says she coughs frequently, usually dry.  Her husband minimizes this.  Little or no wheeze.  No discolored sputum, adenopathy or chest pain. 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  ROS-see HPI   + = positive Constitutional:   No-   weight loss, night sweats, fevers, chills, fatigue, lassitude. HEENT:   No-  headaches, difficulty swallowing, tooth/dental problems, sore throat,       No-  sneezing, itching, ear ache, nasal congestion, post nasal drip,  CV:  No-   chest pain, orthopnea, PND, swelling in lower extremities, anasarca,  dizziness, palpitations Resp: +  shortness of breath with exertion or at rest.              +productive cough,  +non-productive cough,  No- coughing up of blood.                change in color of mucus.   wheezing.   Skin: No-   rash or lesions. GI:  No-   heartburn, indigestion, abdominal pain, nausea, vomiting, GU: . MS:  No-   joint pain or swelling.   Neuro-     nothing unusual Psych:  No- change in mood or affect. No depression or anxiety.  + memory loss.  OBJ- Physical Exam General- Alert, Oriented, Affect-appropriate, Distress- none acute Skin- rash-none, lesions- none, excoriation- none Lymphadenopathy- none Head- atraumatic            Eyes- Gross vision intact, PERRLA, conjunctivae and secretions clear            Ears- Hearing, canals-normal            Nose- Clear, no-Septal dev, mucus, polyps, erosion, perforation             Throat- Mallampati  II , mucosa clear , drainage- none, tonsils- atrophic Neck- flexible , trachea midline, no stridor , thyroid nl, carotid no bruit Chest - symmetrical excursion , unlabored           Heart/CV- +IRR/ Afib, no murmur , no gallop  , no rub, nl s1 s2                           - JVD- none , edema- none, stasis changes- none, varices- none           Lung- clear to P&A,  cough-none , dullness-none, rub- none, wheeze- none            Chest wall-  Abd-  Br/ Gen/ Rectal- Not done, not indicated Extrem- cyanosis- none, clubbing, none, atrophy- none, strength- nl Neuro- grossly intact to observation

## 2017-07-20 NOTE — Patient Instructions (Signed)
Ok to continue [employer]- glad it has worked so well for you.  Please call if we can help  We suggested you could try over the counter cough remedies like Delsym cough syrup, or cough drops, if needed.

## 2017-07-20 NOTE — Assessment & Plan Note (Signed)
Well-controlled without complication.  Cough apparently does not amount to much, according to husband who is attentive.  They both feel she does not need a rescue inhaler. Lan-continue Anoro

## 2017-07-20 NOTE — Assessment & Plan Note (Signed)
Ventricular response rate is well controlled at this visit.  She is followed by cardiology.

## 2017-08-14 ENCOUNTER — Other Ambulatory Visit: Payer: Self-pay | Admitting: Internal Medicine

## 2017-09-06 ENCOUNTER — Other Ambulatory Visit: Payer: Self-pay | Admitting: Family Medicine

## 2017-09-06 DIAGNOSIS — M810 Age-related osteoporosis without current pathological fracture: Secondary | ICD-10-CM

## 2017-09-12 ENCOUNTER — Other Ambulatory Visit: Payer: Self-pay | Admitting: Cardiovascular Disease

## 2017-09-12 MED ORDER — NEBIVOLOL HCL 2.5 MG PO TABS: 2.5000 mg | ORAL_TABLET | Freq: Every day | ORAL | 1 refills | Status: DC

## 2017-09-18 ENCOUNTER — Other Ambulatory Visit: Payer: Self-pay | Admitting: Cardiovascular Disease

## 2017-09-18 MED ORDER — NEBIVOLOL HCL 2.5 MG PO TABS: 2.5000 mg | ORAL_TABLET | Freq: Every day | ORAL | 1 refills | Status: DC

## 2017-09-18 NOTE — Telephone Encounter (Signed)
Pt's medication was sent to pt's pharmacy as requested. Confirmation received.  °

## 2017-09-28 ENCOUNTER — Ambulatory Visit (INDEPENDENT_AMBULATORY_CARE_PROVIDER_SITE_OTHER): Payer: Medicare Other | Admitting: Cardiovascular Disease

## 2017-09-28 ENCOUNTER — Encounter: Payer: Self-pay | Admitting: Cardiovascular Disease

## 2017-09-28 VITALS — BP 104/58 | HR 66 | Ht 67.0 in | Wt 133.0 lb

## 2017-09-28 DIAGNOSIS — I10 Essential (primary) hypertension: Secondary | ICD-10-CM

## 2017-09-28 DIAGNOSIS — I482 Chronic atrial fibrillation, unspecified: Secondary | ICD-10-CM

## 2017-09-28 DIAGNOSIS — I351 Nonrheumatic aortic (valve) insufficiency: Secondary | ICD-10-CM | POA: Diagnosis not present

## 2017-09-28 NOTE — Patient Instructions (Signed)
Medication Instructions:  Your physician recommends that you continue on your current medications as directed. Please refer to the Current Medication list given to you today.   Labwork: None Ordered   Testing/Procedures: None Ordered   Follow-Up: Your physician wants you to follow-up in: 6 months with Dr. Nahser.  You will receive a reminder letter in the mail two months in advance. If you don't receive a letter, please call our office to schedule the follow-up appointment.   If you need a refill on your cardiac medications before your next appointment, please call your pharmacy.   Thank you for choosing [facility]! Larri Brewton, RN [phone]    

## 2017-09-28 NOTE — Progress Notes (Signed)
Natalie Ho  Date of Birth  [birth year] [facility] [address]    [place], [place]  [address] [phone]  Fax  [phone]  Problems: 1. Atrial fibrillation 2. Remote stroke 3. Aortic insufficiency 4. Hypertension 5. Hypercholesterolemia    84 yo Hx of a-Fib, remote stroke, HTN, hypercholesterolemia.  Her last visit we added amlodipine 2.5 mg a day. She also started taking her Benicar 20 mg twice a day instead of 40 mg at once. She is feeling quite a bit better. Her blood pressure readings have stabilized. She still occasionally has some signs of mild hypertension with a systolic blood pressure 244.      She also has episodes where her blood pressure will become fairly low especially at night. They're given some nights when she has held her medication for several hours because her blood pressure was in the 90 range.  She's not been limited by chest pain or shortness of breath.  She has been having some problems with hypertension.  We added amlodipine 2.5 mg at her last visit.  Her blood pressures have been better.    Jul 11, 2012:  She has done well after her left hip replacement.   She has some dyspnea but is overall doing well.   She has stopped her Benicar and is feeling much better.  She was having low BP readings.   September 07, 2014   Natalie Ho is doing well .  No CP .  Mild dyspnea with exercise.  Jul 28, 2015:  Has some DOE with mowing the grass and vacuuming . Echo in 2014 shows normal LV function. She did have moderate pulmonary HTN ( PA pressure of 46)  BP has been ok at home. A bit elevated here.  Has a chronic cough  Records from Dr. Melford Aase were reviewed.   Labs look great.   Nov. 30, 2017:  Natalie Ho is seen today for follow up of her atrial fib and pulmonary HTN. Has shortness of breath with exertion .  The lasix has not helped.  Has seen a pulmonologist .  August 16, 2016:    Natalie Ho has had some low BP readings,  Has had presyncope Has  shingles in her right eye  Was started on Anoro ( inhaler) and is breathing much better.   Dec. 5, 2018: Natalie Ho is feeling well. Is losing the vision in her right eye.   is on Anora ( new inhaler ) has mild pulmonary HTN by echo  Has greatly reduced her salt intake   September 28, 2017:  Natalie Ho seen today for follow-up of her chronic atrial fibrillation.  She is also having some eye problems and is on Diamox.  She has a history of mild pulmonary hypertension.  Blood pressure remains well controlled. Is blind in her right eye.  WBC is low - going to hem-Onc doctor today    Current Outpatient Medications on File Prior to Visit  Medication Sig Dispense Refill  . ANORO ELLIPTA 62.5-25 MCG/INH AEPB INHALE 1 PUFF BY MOUTH EVERY DAY 1 each 5  . aspirin EC 81 MG tablet Take 81 mg by mouth daily.    Marland Kitchen atorvastatin (LIPITOR) 20 MG tablet Take 1 tablet (20 mg total) by mouth at bedtime. 90 tablet 3  . Calcium Citrate-Vitamin D (CITRACAL PETITES/VITAMIN D PO) Citracal + D Petites    . cholecalciferol (VITAMIN D) 1000 UNITS tablet Take 1,000 Units by mouth daily.    Marland Kitchen escitalopram (LEXAPRO) 10  MG tablet Take 10 mg by mouth daily.    Marland Kitchen latanoprost (XALATAN) 0.005 % ophthalmic solution Place 1-2 drops into the left eye daily.     . nebivolol (BYSTOLIC) 2.5 MG tablet Take 2.5 mg by mouth daily.    Mckinley Jewel Dimesylate (RHOPRESSA OP) Apply 1 drop to eye daily.     Marland Kitchen PREDNISOLONE ACETATE OP Apply 1 drop to eye daily.    Marland Kitchen SIMBRINZA 1-0.2 % SUSP Place 1 drop into the right eye 3 (three) times daily.   0  . XARELTO 20 MG TABS tablet TAKE 1 TABLET BY MOUTH DAILY WITH SUPPER 30 tablet 5   No current facility-administered medications on file prior to visit.     Allergies  Allergen Reactions  . Benzonatate Other (See Comments)    Unknown reaction  . Shrimp [Shellfish Allergy] Anaphylaxis and Swelling  . Alendronate Sodium Other (See Comments)    Eyes and head hurt  . Dabigatran Etexilate Mesylate Other (See  Comments)    Upset stomach"Pradaxa"  . Diamox [Acetazolamide]     Excessive sleepiness  . Levofloxacin Other (See Comments)    "made her funny in the head"  . Pradaxa [Dabigatran Etexilate Mesylate] Other (See Comments)    "sick to my stomach and lost weight"  . Sulfonamide Derivatives Other (See Comments)    Unknown reaction  . Tramadol Other (See Comments)    "made me feel funny in my head"    Past Medical History:  Diagnosis Date  . Anxiety   . Aortic insufficiency   . Arthritis   . Atrial fibrillation ([place])   . Chronic anticoagulation   . Colitis   . GERD (gastroesophageal reflux disease)   . Glaucoma    both eyes  . Gout   . Hyperlipidemia   . Hypertension   . Melanoma of nose ([place])    "was only a precancer area"-no problems since  . SOB (shortness of breath)   . Stroke [facility])     2007 and  TIA-3'08, none recent  . TIA (transient ischemic attack)   . Tinnitus of both ears     Past Surgical History:  Procedure Laterality Date  . BLEPHAROPLASTY     bilateral  . CARDIOVERSION     Unsuccessful  . cataracts     bilateral  . CHOLECYSTECTOMY N/A 02/26/2013   Procedure: LAPAROSCOPIC CHOLECYSTECTOMY WITH INTRAOPERATIVE CHOLANGIOGRAM;  Surgeon: Edward Jolly, MD;  Location: [facility];  Service: General;  Laterality: N/A;  . colon polyps     removed via colonoscopy  . eye surgeries     Multiple eye surgeries for torned retina-bilateral  . FRACTURE SURGERY     ORIF-Lt. hip -,removed hardware  . TONSILLECTOMY    . TOTAL HIP ARTHROPLASTY  04/03/2012   Procedure: TOTAL HIP ARTHROPLASTY ANTERIOR APPROACH;  Surgeon: Gearlean Alf, MD;  Location: [facility];  Service: Orthopedics;  Laterality: Left;    Social History   Tobacco Use  Smoking Status Former Smoker  . Packs/day: 0.50  . Years: 25.00  . Pack years: 12.50  . Types: Cigarettes  . Last attempt to quit: 03/13/1990  . Years since quitting: 27.5  Smokeless Tobacco Never Used    Social History   Substance  and Sexual Activity  Alcohol Use Yes  . Alcohol/week: 3.0 oz  . Types: 5 drink(s) per week   Comment: wine daily    Family History  Problem Relation Age of Onset  . Heart disease Mother  Reviw of Systems:  Reviewed in the HPI.  All other systems are negative.  Physical Exam: Blood pressure (!) 104/58, pulse 66, height 5\' 7"  (1.702 m), weight 133 lb (60.3 kg), SpO2 97 %.  GEN:  Well nourished, well developed in no acute distress HEENT: Normal NECK: No JVD; No carotid bruits LYMPHATICS: No lymphadenopathy CARDIAC:   irreg irreg.  RESPIRATORY:  Clear to auscultation without rales, wheezing or rhonchi  ABDOMEN: Soft, non-tender, non-distended MUSCULOSKELETAL:  No edema; No deformity  SKIN: Warm and dry NEUROLOGIC:  Alert and oriented x 3   ECG:     September 28, 2017: Atrial fibrillation at 68.  Poor R wave progression.  Assessment / Plan:   1.  Shortness of breath   :  About the same    1. Atrial fibrillation -HR is well controlled.   Cont. Meds.  2. Remote stroke-   Stable  3. Aortic insufficiency-   Mild AI by exam . . 4. Hypertension  BP is well controlled.   5. Hypercholesterolemia -stable Managed by her primary MD    Mertie Moores, MD  09/28/2017 8:20 AM    [place],  [place], [place]  [address] Pager [phone] Phone: [phone]; Fax: [phone]

## 2017-11-05 ENCOUNTER — Ambulatory Visit
Admission: RE | Admit: 2017-11-05 | Discharge: 2017-11-05 | Disposition: A | Discharge status: Home / Self Care | Payer: Medicare Other | Source: Ambulatory Visit | Attending: Family Medicine | Admitting: Family Medicine

## 2017-11-05 DIAGNOSIS — M810 Age-related osteoporosis without current pathological fracture: Secondary | ICD-10-CM

## 2017-11-08 ENCOUNTER — Other Ambulatory Visit: Payer: Self-pay | Admitting: Cardiovascular Disease

## 2017-11-08 NOTE — Telephone Encounter (Signed)
Pt last saw Dr Acie Fredrickson 09/28/17, last labs 06/18/17 Creat 0.74, age 84, weight 60.3kg, CrCl 52.91, based on CrCl pt is on appropriate dosage of Xarelto 20mg  QD.  Will refill rx.

## 2017-12-18 ENCOUNTER — Ambulatory Visit (INDEPENDENT_AMBULATORY_CARE_PROVIDER_SITE_OTHER): Payer: Medicare Other | Admitting: *Deleted

## 2017-12-18 DIAGNOSIS — I4891 Unspecified atrial fibrillation: Secondary | ICD-10-CM | POA: Diagnosis not present

## 2017-12-18 LAB — BASIC METABOLIC PANEL
BUN/Creatinine Ratio: 18 (ref 12–28)
BUN: 14 mg/dL (ref 8–27)
CALCIUM: 9.1 mg/dL (ref 8.7–10.3)
CO2: 27 mmol/L (ref 20–29)
CREATININE: 0.79 mg/dL (ref 0.57–1.00)
Chloride: 99 mmol/L (ref 96–106)
GFR calc Af Amer: 79 mL/min/{1.73_m2} (ref >59)
GFR calc non Af Amer: 68 mL/min/{1.73_m2} (ref >59)
Glucose: 75 mg/dL (ref 65–99)
Potassium: 4.3 mmol/L (ref 3.5–5.2)
Sodium: 140 mmol/L (ref 134–144)

## 2017-12-18 LAB — CBC
HEMATOCRIT: 40 % (ref 34.0–46.6)
Hemoglobin: 14 g/dL (ref 11.1–15.9)
MCH: 32.2 pg (ref 26.6–33.0)
MCHC: 35 g/dL (ref 31.5–35.7)
MCV: 92 fL (ref 79–97)
PLATELETS: 197 10*3/uL (ref 150–450)
RBC: 4.35 x10E6/uL (ref 3.77–5.28)
RDW: 13.8 % (ref 12.3–15.4)
WBC: 3 10*3/uL — AB (ref 3.4–10.8)

## 2017-12-18 NOTE — Progress Notes (Signed)
Pt was started on Xarelto15mg  dailyforAfib on5/17/17 by Dr. Acie Fredrickson. Dose was increased by Dr. Acie Fredrickson on 03/01/17 to 20mg  daily due to CrCl of 58.39ml/min and CrCl-49.84ml/min per resulted labs.   Reviewed patients medication list.  Pt is not currently on any combined P-gp and strong CYP3A4 inhibitors/inducers (ketoconazole, traconazole, ritonavir, carbamazepine, phenytoin, rifampin, St. John's wort).  Reviewed labs: SCr 0.79, Hgb and HCT 14.0/40.0, Weight-60.0kg, CrCl- 49.64mL/min.  Dose inappropriate based on CrCl and dosing criteria.    A full discussion of the nature of anticoagulants has been carried out.  A benefit/risk analysis has been presented to the patient, so that they understand the justification for choosing anticoagulation with Xarelto at this time.  The need for compliance is stressed.  Pt is aware to take the medication once daily with the largest meal of the day.  Side effects of potential bleeding are discussed, including unusual colored urine or stools, coughing up blood or coffee ground emesis, nose bleeds or serious fall or head trauma.  Discussed signs and symptoms of stroke. The patient should avoid any OTC items containing aspirin or ibuprofen.  Avoid alcohol consumption.   Call if any signs of abnormal bleeding.  Discussed financial obligations and resolved any difficulty in obtaining medication.   Staff message sent to Dr. Acie Fredrickson due to pt's CrCl decreased to 49.58ml/min. 10/10-pt aware awaiting a msg from Dr. Acie Fredrickson regarding dose due to CrCl.  10/14-staff message received from Dr. Acie Fredrickson and he stated to continue with Xarelto 20mg  daily and recheck labs in 6 months. Please see phone note on today, 12/24/17.  Patient on Xarelto 20mg  and appt set in 6 months.

## 2017-12-24 ENCOUNTER — Telehealth: Payer: Self-pay | Admitting: *Deleted

## 2017-12-24 NOTE — Telephone Encounter (Signed)
Called the pt to update her about remaining on Xarelto 20mg  daily per Dr. Acie Fredrickson. The pt was not at home but spoke with her Husband, Thayer Jew and he is aware the pt will remain on Xarelto 20mg  daily and to call if the pt needs anything. Gave him our number to call back to [facility] for any question. Also, appt set for 6 months and pt to call back if needed to reschedule.

## 2017-12-24 NOTE — Telephone Encounter (Signed)
-----   Message from Thayer Headings, MD sent at 12/23/2017  4:45 PM EDT ----- Thank you Georgina Peer,  I will go with your recommendation We will change to Xarelto 20 mg a day and recheck BMP in 6 months   Phil   ----- Message ----- From: Erskine Emery, [facility] Sent: 12/20/2017   2:49 PM EDT To: Marcos Eke, RN, Thayer Headings, MD  Dr. Acie Fredrickson,   This is definitely a difficult case. In the clinical trials the patients remained on the dose that they were started on. Therefore, I think you can make an argument for keeping her on the high dose (especially given her Crcl is right at the cut off). You could also argue that at her age, her kidney function is unlikely to improve and thus could justify decreasing her to the 15mg . I agree that we likely will have the same problem with the change to Eliquis.   I would lean toward keeping her on 20mg , recheck in 6 months and adjust at that time if Crcl remains below 43ml/min. Hope this helps!  Please advise. Thanks! Georgina Peer   ----- Message ----- From: Thayer Headings, MD Sent: 12/19/2017   6:02 PM EDT To: Marcos Eke, RN, Leeroy Bock, RPH  Her creatinine is right on the line for dosing Xarelto.   I've looked into switching to Eliquis to see if it would be easier to dose but her weight is 60.3 kg.  She is 84 yo.  So it seems that she is right on the line for Eliquis dosing as well.   Megan, do you have any thought about which would be the better approach.  I would favor decreasing the Xarelto to 15 mg for now.    Thanks  Phil   ----- Message ----- From: Marcos Eke, RN Sent: 12/19/2017   9:55 AM EDT To: Thayer Headings, MD  Greetings, I saw the pt in the Anticoagulation Clinic yesterday for her Xarelto follow-up and she had her labs drawn. The labs resulted and her CrCl has decreased to 61ml/min from 52.38ml/min in April 2019 and 58.29ml/min in December 2018; therefore her Xarelto dose is now incorrect per dosing  criteria.  She was originally started on Xarelto15mg  dailyforAfib on5/17/17 then her dose was increased on 03/01/17 after a staff message to 20mg  daily.  Please advise on the dosing and Thank You. Derrel Nip, RN

## 2018-02-02 ENCOUNTER — Other Ambulatory Visit: Payer: Self-pay | Admitting: Internal Medicine

## 2018-03-01 ENCOUNTER — Encounter: Payer: Self-pay | Admitting: Cardiovascular Disease

## 2018-03-04 ENCOUNTER — Other Ambulatory Visit: Payer: Self-pay | Admitting: Cardiovascular Disease

## 2018-03-27 ENCOUNTER — Ambulatory Visit (INDEPENDENT_AMBULATORY_CARE_PROVIDER_SITE_OTHER): Payer: Medicare Other | Admitting: Cardiovascular Disease

## 2018-03-27 ENCOUNTER — Encounter: Payer: Self-pay | Admitting: Cardiovascular Disease

## 2018-03-27 VITALS — BP 118/72 | HR 64 | Ht 67.0 in | Wt 131.8 lb

## 2018-03-27 DIAGNOSIS — I351 Nonrheumatic aortic (valve) insufficiency: Secondary | ICD-10-CM

## 2018-03-27 DIAGNOSIS — I10 Essential (primary) hypertension: Secondary | ICD-10-CM

## 2018-03-27 DIAGNOSIS — I272 Pulmonary hypertension, unspecified: Secondary | ICD-10-CM

## 2018-03-27 DIAGNOSIS — I482 Chronic atrial fibrillation, unspecified: Secondary | ICD-10-CM

## 2018-03-27 NOTE — Patient Instructions (Signed)
Medication Instructions:  Your physician has recommended you make the following change in your medication:  STOP Aspirin  If you need a refill on your cardiac medications before your next appointment, please call your pharmacy.    Lab work: None Ordered    Testing/Procedures: None Ordered   Follow-Up: At [facility], you and your health needs are our priority.  As part of our continuing mission to provide you with exceptional heart care, we have created designated Provider Care Teams.  These Care Teams include your primary Cardiologist (physician) and Advanced Practice Providers (APPs -  Physician Assistants and Nurse Practitioners) who all work together to provide you with the care you need, when you need it. You will need a follow up appointment in:  6 months.  Please call our office 2 months in advance to schedule this appointment.  You may see Philip Nahser, MD or one of the following Advanced Practice Providers on your designated Care Team: Scott Weaver, PA-C Vin Bhagat, PA-C . Janine Hammond, NP   

## 2018-03-27 NOTE — Progress Notes (Signed)
Natalie Ho  Date of Birth  [birth year] [facility] [address]    [place], [place]  [address] [phone]  Fax  [phone]  Problems: 1. Atrial fibrillation 2. Remote stroke 3. Aortic insufficiency 4. Hypertension 5. Hypercholesterolemia    85 yo Hx of a-Fib, remote stroke, HTN, hypercholesterolemia.  Her last visit we added amlodipine 2.5 mg a day. She also started taking her Benicar 20 mg twice a day instead of 40 mg at once. She is feeling quite a bit better. Her blood pressure readings have stabilized. She still occasionally has some signs of mild hypertension with a systolic blood pressure 097.      She also has episodes where her blood pressure will become fairly low especially at night. They're given some nights when she has held her medication for several hours because her blood pressure was in the 90 range.  She's not been limited by chest pain or shortness of breath.  She has been having some problems with hypertension.  We added amlodipine 2.5 mg at her last visit.  Her blood pressures have been better.    Jul 11, 2012:  She has done well after her left hip replacement.   She has some dyspnea but is overall doing well.   She has stopped her Benicar and is feeling much better.  She was having low BP readings.   September 07, 2014   Natalie Ho is doing well .  No CP .  Mild dyspnea with exercise.  Jul 28, 2015:  Has some DOE with mowing the grass and vacuuming . Echo in 2014 shows normal LV function. She did have moderate pulmonary HTN ( PA pressure of 46)  BP has been ok at home. A bit elevated here.  Has a chronic cough  Records from Dr. Melford Aase were reviewed.   Labs look great.   Nov. 30, 2017:  Natalie Ho is seen today for follow up of her atrial fib and pulmonary HTN. Has shortness of breath with exertion .  The lasix has not helped.  Has seen a pulmonologist .  August 16, 2016:    Natalie Ho has had some low BP readings,  Has had presyncope Has  shingles in her right eye  Was started on Anoro ( inhaler) and is breathing much better.   Dec. 5, 2018: Natalie Ho is feeling well. Is losing the vision in her right eye.   is on Anora ( new inhaler ) has mild pulmonary HTN by echo  Has greatly reduced her salt intake   September 28, 2017:  Natalie Ho seen today for follow-up of her chronic atrial fibrillation.  She is also having some eye problems and is on Diamox.  She has a history of mild pulmonary hypertension.  Blood pressure remains well controlled. Is blind in her right eye.  WBC is low - going to hem-Onc doctor today   Mar 27, 2018  Natalie Ho  is seen today for her chronic AFib .   She also has moderate pulmonary HTN - seems to be stable  Thinks her HR has increased recently   Total chol = 134 HDL is 63 Trigs = 50   She is on ASA 81 mg as well as Xarelto We will DC ASA today     Current Outpatient Medications on File Prior to Visit  Medication Sig Dispense Refill  . ANORO ELLIPTA 62.5-25 MCG/INH AEPB INHALE 1 PUFF BY MOUTH EVERY DAY 60 each 2  . aspirin EC 81  MG tablet Take 81 mg by mouth daily.    Marland Kitchen atorvastatin (LIPITOR) 20 MG tablet Take 1 tablet (20 mg total) by mouth at bedtime. 90 tablet 3  . Calcium Citrate-Vitamin D (CITRACAL PETITES/VITAMIN D PO) Citracal + D Petites    . cholecalciferol (VITAMIN D) 1000 UNITS tablet Take 1,000 Units by mouth daily.    . Cyanocobalamin (VITAMIN B12 PO) Take 1 tablet by mouth daily.    Marland Kitchen escitalopram (LEXAPRO) 10 MG tablet Take 10 mg by mouth daily.    Marland Kitchen ibandronate (BONIVA) 150 MG tablet Take by mouth.    . latanoprost (XALATAN) 0.005 % ophthalmic solution Place 1-2 drops into the left eye daily.     . nebivolol (BYSTOLIC) 2.5 MG tablet Take 1 tablet (2.5 mg total) by mouth daily. BETA BLOCKER 90 tablet 0  . Netarsudil Dimesylate (RHOPRESSA OP) Apply 1 drop to eye daily.     Marland Kitchen PREDNISOLONE ACETATE OP Apply 1 drop to eye daily.    Marland Kitchen SIMBRINZA 1-0.2 % SUSP Place 1 drop into the right eye 3 (three)  times daily.   0  . XARELTO 20 MG TABS tablet TAKE 1 TABLET BY MOUTH DAILY WITH SUPPER 30 tablet 6   No current facility-administered medications on file prior to visit.     Allergies  Allergen Reactions  . Benzonatate Other (See Comments) and Rash    Unknown reaction Unknown reaction Unknown reaction   . Shrimp [Shellfish Allergy] Anaphylaxis and Swelling  . Dabigatran Diarrhea and Nausea And Vomiting  . Dabigatran Etexilate Mesylate Other (See Comments) and Rash    "sick to my stomach and lost weight" "sick to my stomach and lost weight" Upset stomach"Pradaxa" "sick to my stomach and lost weight" Upset stomach"Pradaxa"   . Diclofenac Other (See Comments) and Rash    Either dizzines, GI upset Either dizzines, GI upset   . Alendronate Sodium Other (See Comments)    Eyes and head hurt  . Dabigatran Etexilate Mesylate Other (See Comments)    Upset stomach"Pradaxa"  . Diamox [Acetazolamide]     Excessive sleepiness  . Levofloxacin Other (See Comments)    "made her funny in the head" "made her funny in the head"  . Sulfonamide Derivatives Other (See Comments)    Unknown reaction  . Tramadol Other (See Comments) and Nausea And Vomiting    "made me feel funny in my head" "made me feel funny in my head"    Past Medical History:  Diagnosis Date  . Anxiety   . Aortic insufficiency   . Arthritis   . Atrial fibrillation ([place])   . Chronic anticoagulation   . Colitis   . GERD (gastroesophageal reflux disease)   . Glaucoma    both eyes  . Gout   . Hyperlipidemia   . Hypertension   . Melanoma of nose ([place])    "was only a precancer area"-no problems since  . SOB (shortness of breath)   . Stroke [facility])     2007 and  TIA-3'08, none recent  . TIA (transient ischemic attack)   . Tinnitus of both ears     Past Surgical History:  Procedure Laterality Date  . BLEPHAROPLASTY     bilateral  . CARDIOVERSION     Unsuccessful  . cataracts     bilateral  . CHOLECYSTECTOMY  N/A 02/26/2013   Procedure: LAPAROSCOPIC CHOLECYSTECTOMY WITH INTRAOPERATIVE CHOLANGIOGRAM;  Surgeon: Edward Jolly, MD;  Location: [facility];  Service: General;  Laterality: N/A;  . colon  polyps     removed via colonoscopy  . eye surgeries     Multiple eye surgeries for torned retina-bilateral  . FRACTURE SURGERY     ORIF-Lt. hip -,removed hardware  . TONSILLECTOMY    . TOTAL HIP ARTHROPLASTY  04/03/2012   Procedure: TOTAL HIP ARTHROPLASTY ANTERIOR APPROACH;  Surgeon: Gearlean Alf, MD;  Location: [facility];  Service: Orthopedics;  Laterality: Left;    Social History   Tobacco Use  Smoking Status Former Smoker  . Packs/day: 0.50  . Years: 25.00  . Pack years: 12.50  . Types: Cigarettes  . Last attempt to quit: 03/13/1990  . Years since quitting: 28.0  Smokeless Tobacco Never Used    Social History   Substance and Sexual Activity  Alcohol Use Yes  . Alcohol/week: 5.0 standard drinks  . Types: 5 drink(s) per week   Comment: wine daily    Family History  Problem Relation Age of Onset  . Heart disease Mother     Reviw of Systems:  Reviewed in the HPI.  All other systems are negative.  Physical Exam: Blood pressure 118/72, pulse 64, height 5\' 7"  (1.702 m), weight 131 lb 12.8 oz (59.8 kg), SpO2 97 %.  GEN:   Elderly female,  NAD  HEENT: Normal NECK: No JVD; No carotid bruits LYMPHATICS: No lymphadenopathy CARDIAC: Irreg.   Irreg. , soft diastolic and systolic murmur RESPIRATORY:  Clear to auscultation without rales, wheezing or rhonchi  ABDOMEN: Soft, non-tender, non-distended MUSCULOSKELETAL:  No edema; No deformity  SKIN: Warm and dry NEUROLOGIC:  Alert and oriented x 3   ECG:        Assessment / Plan:   1.  Shortness of breath   :  About the same    1. Atrial fibrillation   heart rate is well controlled.  Continue current medications. 2. Remote stroke-   Stable  3. Aortic insufficiency-   Has mild AI , stable  4. Hypertension BP has been well  controlled   5. Hypercholesterolemia -stable Managed by her primary MD    Mertie Moores, MD  03/27/2018 9:47 AM    [place] [place],  [place], [place]  [address] Pager [phone] Phone: [phone]; Fax: [phone]

## 2018-05-27 ENCOUNTER — Other Ambulatory Visit: Payer: Self-pay

## 2018-05-27 DIAGNOSIS — I482 Chronic atrial fibrillation, unspecified: Secondary | ICD-10-CM

## 2018-06-24 ENCOUNTER — Other Ambulatory Visit: Payer: Self-pay | Admitting: Internal Medicine

## 2018-07-08 ENCOUNTER — Other Ambulatory Visit: Payer: Self-pay | Admitting: Cardiovascular Disease

## 2018-07-08 NOTE — Telephone Encounter (Signed)
Age 85, weight 59.8kg, SCr 0.79 on 12/18/17, CrCl 48.30mL/min. BMET is being rechecked 5/18 - will wait for recheck before decreasing Xarelto dose to 15mg  daily as her clearance is borderline between the two doses. Will send in 1 month refill and reassess once BMET is rechecked next month.

## 2018-07-23 ENCOUNTER — Ambulatory Visit: Payer: Medicare Other | Admitting: Internal Medicine

## 2018-07-29 ENCOUNTER — Other Ambulatory Visit: Payer: Medicare Other

## 2018-08-07 ENCOUNTER — Other Ambulatory Visit: Payer: Self-pay | Admitting: Cardiovascular Disease

## 2018-08-08 NOTE — Telephone Encounter (Signed)
Called pt - she did not show for BMET and CBC a few weeks ago. Rescheduled lab work for tomorrow 5/29. She has enough Xarelto for at least a week. Also canceled 6 mo Xarelto appt that was scheduled for 6/15. Will await BMET results to determine which dose of Xarelto needs to be refilled.

## 2018-08-08 NOTE — Telephone Encounter (Signed)
Pt last saw Dr Acie Fredrickson 03/27/18, last labs 12/18/17 Creat 0.79, age 85, weight 59.8kg, CrCl 48.26, based on CrCl pt is not on appropriate dosage of Xarelto 20mg  QD. Per chart review Fuller Canada, PharmD refilled Xarelto x 1 month on 07/08/18 and pt was to have BMP rechecked before reducing Xarelto to 15mg  BID since pt is borderline between dosages.  Looked in care everywhere and [facility] no recent BMP done.  Will forward refill to [facility] to address dosage change since she was dealing with this previously.

## 2018-08-09 ENCOUNTER — Other Ambulatory Visit: Payer: Self-pay

## 2018-08-09 ENCOUNTER — Other Ambulatory Visit: Payer: Medicare Other | Admitting: *Deleted

## 2018-08-09 DIAGNOSIS — I482 Chronic atrial fibrillation, unspecified: Secondary | ICD-10-CM

## 2018-08-09 LAB — BASIC METABOLIC PANEL
BUN/Creatinine Ratio: 19 (ref 12–28)
BUN: 14 mg/dL (ref 8–27)
CO2: 27 mmol/L (ref 20–29)
Calcium: 9 mg/dL (ref 8.7–10.3)
Chloride: 95 mmol/L — ABNORMAL LOW (ref 96–106)
Creatinine, Ser: 0.75 mg/dL (ref 0.57–1.00)
GFR calc Af Amer: 83 mL/min/{1.73_m2} (ref >59)
GFR calc non Af Amer: 72 mL/min/{1.73_m2} (ref >59)
Glucose: 107 mg/dL — ABNORMAL HIGH (ref 65–99)
Potassium: 4.5 mmol/L (ref 3.5–5.2)
Sodium: 135 mmol/L (ref 134–144)

## 2018-08-09 LAB — CBC
Hematocrit: 42.8 % (ref 34.0–46.6)
Hemoglobin: 14.1 g/dL (ref 11.1–15.9)
MCH: 30.9 pg (ref 26.6–33.0)
MCHC: 32.9 g/dL (ref 31.5–35.7)
MCV: 94 fL (ref 79–97)
Platelets: 206 10*3/uL (ref 150–450)
RBC: 4.57 x10E6/uL (ref 3.77–5.28)
RDW: 12.5 % (ref 11.7–15.4)
WBC: 3.7 10*3/uL (ref 3.4–10.8)

## 2018-08-09 NOTE — Telephone Encounter (Signed)
Crcl 4ml/min based off of scr of 0.75 from today. Will continue with Xarelto 20mg  daily. Spoke with patient and informed her of labs results and that there would be no change to dose at this time

## 2018-08-23 ENCOUNTER — Other Ambulatory Visit: Payer: Self-pay | Admitting: Cardiovascular Disease

## 2018-08-23 MED ORDER — NEBIVOLOL HCL 2.5 MG PO TABS: 2.5000 mg | ORAL_TABLET | Freq: Every day | ORAL | 1 refills | Status: DC

## 2018-11-04 ENCOUNTER — Ambulatory Visit (INDEPENDENT_AMBULATORY_CARE_PROVIDER_SITE_OTHER): Payer: Medicare Other

## 2018-11-04 ENCOUNTER — Other Ambulatory Visit: Payer: Self-pay

## 2018-11-04 ENCOUNTER — Ambulatory Visit (INDEPENDENT_AMBULATORY_CARE_PROVIDER_SITE_OTHER): Payer: Medicare Other | Admitting: Internal Medicine

## 2018-11-04 ENCOUNTER — Encounter: Payer: Self-pay | Admitting: Internal Medicine

## 2018-11-04 VITALS — BP 124/60 | HR 76 | Temp 97.5°F | Ht 67.0 in | Wt 133.2 lb

## 2018-11-04 DIAGNOSIS — J449 Chronic obstructive pulmonary disease, unspecified: Secondary | ICD-10-CM

## 2018-11-04 DIAGNOSIS — Z23 Encounter for immunization: Secondary | ICD-10-CM

## 2018-11-04 DIAGNOSIS — I482 Chronic atrial fibrillation, unspecified: Secondary | ICD-10-CM

## 2018-11-04 DIAGNOSIS — R911 Solitary pulmonary nodule: Secondary | ICD-10-CM | POA: Diagnosis not present

## 2018-11-04 NOTE — Patient Instructions (Signed)
Order- Flu vax- senior  Order- CXR dx   COPD mixed type  Please call if we can help

## 2018-11-04 NOTE — Progress Notes (Signed)
HPI female former smoker followed for COPD, complicated by history lung nodule, GERD, A. fib, HBP, TIA, glaucoma   PFT: 04/20/2011-mild obstructive airways disease with insignificant response to bronchodilator, air trapping, diffusion mildly reduced. FEV1/FVC 0.63, DLCO 76%. 6 minute walk test-97%, 96%, 98%, 513 m. Well-maintained oxygenation. CT chest 04/20/12 IMPRESSION:  1. No evidence of acute pulmonary embolism.  2. Small pleural effusions, cardiomegaly, and slightly prominent  interstitial markings may indicate very mild interstitial edema.  3. Calcified mediastinal and hilar nodes with calcified right  upper lobe granuloma consistent with prior granulomatous disease.  Original Report Authenticated By: Ivar Drape, M.D PFT 02/23/2016- Severe obstructive airways disease minimal response to bronchodilator, air trapping, overinflation, diffusion reduced. Emphysema pattern. FVC 1.86/66%, FEV1 1.22/57%, ratio 0.65, RV 219%, TLC 132%, DLCO 21%  -----------------------------------------------------------  07/20/2017-85 year old female former smoker followed for COPD, DOE, complicated by history lung nodule, GERD, A. fib, HBP, TIA, Glaucoma ----COPD mixed type: Pt states she has SOB with exertion at times; otherwise doing well overall.  Anoro,  She and her husband both state and Anoro has been working really well and she has had no need for rescue inhaler.  She asks about skipping Anoro if she does not need it.  She says she coughs frequently, usually dry.  Her husband minimizes this.  Little or no wheeze.  No discolored sputum, adenopathy or chest pain. 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  11/04/2018- 85 year old female former smoker followed for COPD, DOE, complicated by history lung nodule, GERD, A. fib, HBP, TIA, Glaucoma/ blind R eye -----f/u for COPD; pt states her breathing varies; taking Anoro daily Denies changes or acute respiratory events  since last here. Being Covid careful- discussed.   ROS-see HPI   + = positive Constitutional:   No-   weight loss, night sweats, fevers, chills, fatigue, lassitude. HEENT:   No-  headaches, difficulty swallowing, tooth/dental problems, sore throat,       No-  sneezing, itching, ear ache, nasal congestion, post nasal drip,  CV:  No-   chest pain, orthopnea, PND, swelling in lower extremities, anasarca,  dizziness, palpitations Resp: +  shortness of breath with exertion or at rest.              +productive cough,  +non-productive cough,  No- coughing up of blood.                change in color of mucus.   wheezing.   Skin: No-   rash or lesions. GI:  No-   heartburn, indigestion, abdominal pain, nausea, vomiting, GU: . MS:  No-   joint pain or swelling.   Neuro-     nothing unusual Psych:  No- change in mood or affect. No depression or anxiety.  + memory loss.  OBJ- Physical Exam General- Alert, Oriented, Affect-appropriate, Distress- none acute Skin- rash-none, lesions- none, excoriation- none Lymphadenopathy- none Head- atraumatic            Eyes- Gross vision intact, PERRLA, conjunctivae and secretions clear            Ears- Hearing, canals-normal            Nose- Clear, no-Septal dev, mucus, polyps, erosion, perforation             Throat- Mallampati II , mucosa clear , drainage- none, tonsils- atrophic Neck- flexible , trachea midline, no stridor , thyroid nl, carotid no bruit Chest - symmetrical excursion , unlabored  Heart/CV- +IRR/ Afib, no murmur , no gallop  , no rub, nl s1 s2                           - JVD- none , edema- none, stasis changes- none, varices- none           Lung- clear to P&A,  cough-none , dullness-none, rub- none, wheeze- none           Chest wall-  Abd-  Br/ Gen/ Rectal- Not done, not indicated Extrem- cyanosis- none, clubbing, none, atrophy- none, strength- nl Neuro- grossly intact to observation

## 2018-11-06 ENCOUNTER — Other Ambulatory Visit: Payer: Self-pay | Admitting: Internal Medicine

## 2018-11-19 ENCOUNTER — Ambulatory Visit: Payer: Medicare Other | Admitting: Cardiovascular Disease

## 2018-11-25 NOTE — Assessment & Plan Note (Signed)
T+Rhythm is nearly regular at this visit- well controlled AF or sinus. Followed by Cardiology

## 2018-11-25 NOTE — Assessment & Plan Note (Signed)
Controlled uncomplicated at this visit Plan- continue meds, flu vax, update CXR

## 2018-11-25 NOTE — Assessment & Plan Note (Signed)
Updating CXR 

## 2018-12-26 ENCOUNTER — Encounter: Payer: Self-pay | Admitting: Cardiovascular Disease

## 2018-12-26 ENCOUNTER — Ambulatory Visit (INDEPENDENT_AMBULATORY_CARE_PROVIDER_SITE_OTHER): Payer: Medicare Other | Admitting: Cardiovascular Disease

## 2018-12-26 ENCOUNTER — Other Ambulatory Visit: Payer: Self-pay

## 2018-12-26 VITALS — BP 116/72 | HR 59 | Ht 67.0 in | Wt 135.8 lb

## 2018-12-26 DIAGNOSIS — I482 Chronic atrial fibrillation, unspecified: Secondary | ICD-10-CM

## 2018-12-26 DIAGNOSIS — I10 Essential (primary) hypertension: Secondary | ICD-10-CM | POA: Diagnosis not present

## 2018-12-26 DIAGNOSIS — I272 Pulmonary hypertension, unspecified: Secondary | ICD-10-CM

## 2018-12-26 DIAGNOSIS — I351 Nonrheumatic aortic (valve) insufficiency: Secondary | ICD-10-CM | POA: Diagnosis not present

## 2018-12-26 DIAGNOSIS — E785 Hyperlipidemia, unspecified: Secondary | ICD-10-CM

## 2018-12-26 LAB — CBC
Hematocrit: 41.9 % (ref 34.0–46.6)
Hemoglobin: 13.7 g/dL (ref 11.1–15.9)
MCH: 30.8 pg (ref 26.6–33.0)
MCHC: 32.7 g/dL (ref 31.5–35.7)
MCV: 94 fL (ref 79–97)
Platelets: 213 10*3/uL (ref 150–450)
RBC: 4.45 x10E6/uL (ref 3.77–5.28)
RDW: 12.5 % (ref 11.7–15.4)
WBC: 4.3 10*3/uL (ref 3.4–10.8)

## 2018-12-26 LAB — BASIC METABOLIC PANEL
BUN/Creatinine Ratio: 14 (ref 12–28)
BUN: 10 mg/dL (ref 8–27)
CO2: 27 mmol/L (ref 20–29)
Calcium: 9.5 mg/dL (ref 8.7–10.3)
Chloride: 96 mmol/L (ref 96–106)
Creatinine, Ser: 0.73 mg/dL (ref 0.57–1.00)
GFR calc Af Amer: 86 mL/min/{1.73_m2} (ref >59)
GFR calc non Af Amer: 75 mL/min/{1.73_m2} (ref >59)
Glucose: 89 mg/dL (ref 65–99)
Potassium: 4.8 mmol/L (ref 3.5–5.2)
Sodium: 133 mmol/L — ABNORMAL LOW (ref 134–144)

## 2018-12-26 LAB — LIPID PANEL
Chol/HDL Ratio: 1.9 ratio (ref 0.0–4.4)
Cholesterol, Total: 126 mg/dL (ref 100–199)
HDL: 66 mg/dL (ref >39)
LDL Chol Calc (NIH): 50 mg/dL (ref 0–99)
Triglycerides: 36 mg/dL (ref 0–149)
VLDL Cholesterol Cal: 10 mg/dL (ref 5–40)

## 2018-12-26 LAB — HEPATIC FUNCTION PANEL
ALT: 13 IU/L (ref 0–32)
AST: 20 IU/L (ref 0–40)
Albumin: 3.9 g/dL (ref 3.6–4.6)
Alkaline Phosphatase: 69 IU/L (ref 39–117)
Bilirubin Total: 0.7 mg/dL (ref 0.0–1.2)
Bilirubin, Direct: 0.24 mg/dL (ref 0.00–0.40)
Total Protein: 6.5 g/dL (ref 6.0–8.5)

## 2018-12-26 MED ORDER — NEBIVOLOL HCL 2.5 MG PO TABS: 1.2500 mg | ORAL_TABLET | Freq: Every day | ORAL | 3 refills | Status: DC

## 2018-12-26 NOTE — Patient Instructions (Addendum)
Medication Instructions:  Your physician has recommended you make the following change in your medication:  DECREASE Bystolic (nebivolol) to [id number] mg once daily   *If you need a refill on your cardiac medications before your next appointment, please call your pharmacy*   Lab Work: TODAY - cholesterol, CBC, liver panel, basic metabolic panel If you have labs (blood work) drawn today and your tests are completely normal, you will receive your results only by: Marland Kitchen MyChart Message (if you have MyChart) OR . A paper copy in the mail If you have any lab test that is abnormal or we need to change your treatment, we will call you to review the results.   Testing/Procedures: None Ordered   Follow-Up: At [facility], you and your health needs are our priority.  As part of our continuing mission to provide you with exceptional heart care, we have created designated Provider Care Teams.  These Care Teams include your primary Cardiologist (physician) and Advanced Practice Providers (APPs -  Physician Assistants and Nurse Practitioners) who all work together to provide you with the care you need, when you need it.  Your next appointment:   6 months  The format for your next appointment:   In Person  Provider:   You may see Mertie Moores, MD or one of the following Advanced Practice Providers on your designated Care Team:    Richardson Dopp, PA-C  [place], [place]  Daune Perch, [place]

## 2018-12-26 NOTE — Progress Notes (Addendum)
Natalie Ho  Date of Birth  12-11-1930 [facility] [address]    [place], [place]  [address] [phone]  Fax  [phone]  Problems: 1. Atrial fibrillation 2. Remote stroke 3. Aortic insufficiency 4. Hypertension 5. Hypercholesterolemia    85 yo Hx of a-Fib, remote stroke, HTN, hypercholesterolemia.  Her last visit we added amlodipine 2.5 mg a day. She also started taking her Benicar 20 mg twice a day instead of 40 mg at once. She is feeling quite a bit better. Her blood pressure readings have stabilized. She still occasionally has some signs of mild hypertension with a systolic blood pressure 259.      She also has episodes where her blood pressure will become fairly low especially at night. They're given some nights when she has held her medication for several hours because her blood pressure was in the 90 range.  She's not been limited by chest pain or shortness of breath.  She has been having some problems with hypertension.  We added amlodipine 2.5 mg at her last visit.  Her blood pressures have been better.    Jul 11, 2012:  She has done well after her left hip replacement.   She has some dyspnea but is overall doing well.   She has stopped her Benicar and is feeling much better.  She was having low BP readings.   September 07, 2014   Natalie Ho is doing well .  No CP .  Mild dyspnea with exercise.  Jul 28, 2015:  Has some DOE with mowing the grass and vacuuming . Echo in 2014 shows normal LV function. She did have moderate pulmonary HTN ( PA pressure of 46)  BP has been ok at home. A bit elevated here.  Has a chronic cough  Records from Dr. Melford Aase were reviewed.   Labs look great.   Nov. 30, 2017:  Natalie Ho is seen today for follow up of her atrial fib and pulmonary HTN. Has shortness of breath with exertion .  The lasix has not helped.  Has seen a pulmonologist .  August 16, 2016:    Natalie Ho has had some low BP readings,  Has had presyncope Has  shingles in her right eye  Was started on Anoro ( inhaler) and is breathing much better.   Dec. 5, 2018: Natalie Ho is feeling well. Is losing the vision in her right eye.   is on Anora ( new inhaler ) has mild pulmonary HTN by echo  Has greatly reduced her salt intake   September 28, 2017:  Natalie Ho seen today for follow-up of her chronic atrial fibrillation.  She is also having some eye problems and is on Diamox.  She has a history of mild pulmonary hypertension.  Blood pressure remains well controlled. Is blind in her right eye.  WBC is low - going to hem-Onc doctor today   Mar 27, 2018  Natalie Ho  is seen today for her chronic AFib .   She also has moderate pulmonary HTN - seems to be stable  Thinks her HR has increased recently   Total chol = 134 HDL is 63 Trigs = 50   She is on ASA 81 mg as well as Xarelto We will DC ASA today   Oct. 15, 2020 Natalie Ho is seen today for follow up of her atrial fib and moderate pulmonary HTN Seems to be stable.   Has some DOE if she over does it . Able to mow grass  and do housework .    Current Outpatient Medications on File Prior to Visit  Medication Sig Dispense Refill  . ANORO ELLIPTA 62.5-25 MCG/INH AEPB INHALE 1 PUFF BY MOUTH EVERY DAY 60 each 11  . atorvastatin (LIPITOR) 20 MG tablet Take 1 tablet (20 mg total) by mouth at bedtime. 90 tablet 3  . Calcium Citrate-Vitamin D (CITRACAL PETITES/VITAMIN D PO) Citracal + D Petites    . cholecalciferol (VITAMIN D) 1000 UNITS tablet Take 1,000 Units by mouth daily.    . Cyanocobalamin (VITAMIN B12 PO) Take 1 tablet by mouth daily.    Marland Kitchen escitalopram (LEXAPRO) 10 MG tablet Take 10 mg by mouth daily.    Marland Kitchen latanoprost (XALATAN) 0.005 % ophthalmic solution Place 1-2 drops into the left eye daily.     . nebivolol (BYSTOLIC) 2.5 MG tablet Take 1 tablet (2.5 mg total) by mouth daily. BETA BLOCKER 90 tablet 1  . Netarsudil Dimesylate (RHOPRESSA OP) Apply 1 drop to eye daily.     Marland Kitchen PREDNISOLONE ACETATE OP Apply 1 drop to eye  daily.    Marland Kitchen SIMBRINZA 1-0.2 % SUSP Place 1 drop into the right eye 3 (three) times daily.   0  . XARELTO 20 MG TABS tablet TAKE 1 TABLET BY MOUTH DAILY WITH SUPPER 90 tablet 1   No current facility-administered medications on file prior to visit.     Allergies  Allergen Reactions  . Benzonatate Other (See Comments) and Rash    Unknown reaction Unknown reaction Unknown reaction   . Shrimp [Shellfish Allergy] Anaphylaxis and Swelling  . Dabigatran Diarrhea and Nausea And Vomiting  . Dabigatran Etexilate Mesylate Other (See Comments) and Rash    "sick to my stomach and lost weight" "sick to my stomach and lost weight" Upset stomach"Pradaxa" "sick to my stomach and lost weight" Upset stomach"Pradaxa"   . Diclofenac Other (See Comments) and Rash    Either dizzines, GI upset Either dizzines, GI upset   . Alendronate Sodium Other (See Comments)    Eyes and head hurt  . Dabigatran Etexilate Mesylate Other (See Comments)    Upset stomach"Pradaxa"  . Diamox [Acetazolamide]     Excessive sleepiness  . Levofloxacin Other (See Comments)    "made her funny in the head" "made her funny in the head"  . Sulfonamide Derivatives Other (See Comments)    Unknown reaction  . Tramadol Other (See Comments) and Nausea And Vomiting    "made me feel funny in my head" "made me feel funny in my head"    Past Medical History:  Diagnosis Date  . Anxiety   . Aortic insufficiency   . Arthritis   . Atrial fibrillation ([place])   . Chronic anticoagulation   . Colitis   . GERD (gastroesophageal reflux disease)   . Glaucoma    both eyes  . Gout   . Hyperlipidemia   . Hypertension   . Melanoma of nose ([place])    "was only a precancer area"-no problems since  . SOB (shortness of breath)   . Stroke [facility])     2007 and  TIA-3'08, none recent  . TIA (transient ischemic attack)   . Tinnitus of both ears     Past Surgical History:  Procedure Laterality Date  . BLEPHAROPLASTY     bilateral  .  CARDIOVERSION     Unsuccessful  . cataracts     bilateral  . CHOLECYSTECTOMY N/A 02/26/2013   Procedure: LAPAROSCOPIC CHOLECYSTECTOMY WITH INTRAOPERATIVE CHOLANGIOGRAM;  Surgeon: Marland Kitchen T  Hoxworth, MD;  Location: [facility]S;  Service: General;  Laterality: N/A;  . colon polyps     removed via colonoscopy  . eye surgeries     Multiple eye surgeries for torned retina-bilateral  . FRACTURE SURGERY     ORIF-Lt. hip -,removed hardware  . TONSILLECTOMY    . TOTAL HIP ARTHROPLASTY  04/03/2012   Procedure: TOTAL HIP ARTHROPLASTY ANTERIOR APPROACH;  Surgeon: Gearlean Alf, MD;  Location: [facility] ORS;  Service: Orthopedics;  Laterality: Left;    Social History   Tobacco Use  Smoking Status Former Smoker  . Packs/day: 0.50  . Years: 25.00  . Pack years: 12.50  . Types: Cigarettes  . Quit date: 03/13/1990  . Years since quitting: 28.8  Smokeless Tobacco Never Used    Social History   Substance and Sexual Activity  Alcohol Use Yes  . Alcohol/week: 5.0 standard drinks  . Types: 5 drink(s) per week   Comment: wine daily    Family History  Problem Relation Age of Onset  . Heart disease Mother     Reviw of Systems:  Reviewed in the HPI.  All other systems are negative.  Physical Exam: Blood pressure 116/72, pulse (!) 59, height 5\' 7"  (1.702 m), weight 135 lb 12.8 oz (61.6 kg), SpO2 94 %.  GEN: Elderly female, no acute distress. HEENT: Normal NECK: No JVD; No carotid bruits LYMPHATICS: No lymphadenopathy CARDIAC: Irregularly irregular.  Soft systolic murmur. RESPIRATORY:  Clear to auscultation without rales, wheezing or rhonchi  ABDOMEN: Soft, non-tender, non-distended MUSCULOSKELETAL:  No edema; No deformity  SKIN: Warm and dry NEUROLOGIC:  Alert and oriented x 3    ECG:    December 26, 2018: Atrial fibrillation with a heart rate of 59.  No ST or T wave changes.  Poor R wave progression.    Assessment / Plan:   1.  Shortness of breath   :   due to pulmonary HTN.  Seems to be  very stable.  She does not have any specific complaints.   1. Atrial fibrillation   heart rate is well controlled.  Continue current medications. 2. Remote stroke-   Stable  3. Aortic insufficiency-   Has mild AI , stable  4. Hypertension Blood pressure seems to be well controlled.  Continue current medications.    5. Hypercholesterolemia -we will check labs today.   Mertie Moores, MD  12/26/2018 8:27 AM    [address],  [place], [place]  [address] Pager [phone] Phone: [phone]; Fax: [phone]

## 2019-01-15 ENCOUNTER — Telehealth: Payer: Self-pay | Admitting: Nurse Practitioner

## 2019-01-15 DIAGNOSIS — I272 Pulmonary hypertension, unspecified: Secondary | ICD-10-CM

## 2019-01-15 NOTE — Telephone Encounter (Signed)
Spoke with patient and husband and advised her that per her previous question regarding enlarged heart seen on a chest xray Dr. Acie Fredrickson would like to order an echo since she has not had one since 2017. Patient is scheduled for echo on 11/25.

## 2019-02-05 ENCOUNTER — Ambulatory Visit (HOSPITAL_COMMUNITY): Payer: Medicare Other | Attending: Cardiovascular Disease

## 2019-02-05 ENCOUNTER — Other Ambulatory Visit: Payer: Self-pay

## 2019-02-05 DIAGNOSIS — I272 Pulmonary hypertension, unspecified: Secondary | ICD-10-CM | POA: Diagnosis present

## 2019-02-07 ENCOUNTER — Other Ambulatory Visit: Payer: Self-pay | Admitting: Cardiovascular Disease

## 2019-02-10 NOTE — Telephone Encounter (Signed)
Xarelto 20g refill request received. Pt is 85 years old, weight-61.6kg, Crea-0.73 on 12/26/2018, last seen by Dr. Acie Fredrickson on 12/26/2018, Diagnosis-Afib, CrCl-53.61ml/min; Dose is appropriate based on dosing criteria. Will send in refill to requested pharmacy.

## 2019-05-13 ENCOUNTER — Encounter: Payer: Self-pay | Admitting: Cardiovascular Disease

## 2019-05-13 ENCOUNTER — Ambulatory Visit (INDEPENDENT_AMBULATORY_CARE_PROVIDER_SITE_OTHER): Payer: Medicare Other | Admitting: Cardiovascular Disease

## 2019-05-13 ENCOUNTER — Other Ambulatory Visit: Payer: Self-pay

## 2019-05-13 VITALS — BP 98/56 | HR 70 | Ht 67.0 in | Wt 132.8 lb

## 2019-05-13 DIAGNOSIS — I272 Pulmonary hypertension, unspecified: Secondary | ICD-10-CM

## 2019-05-13 DIAGNOSIS — I482 Chronic atrial fibrillation, unspecified: Secondary | ICD-10-CM

## 2019-05-13 NOTE — Patient Instructions (Signed)
Medication Instructions:  Your physician recommends that you continue on your current medications as directed. Please refer to the Current Medication list given to you today.  *If you need a refill on your cardiac medications before your next appointment, please call your pharmacy*   Lab Work: None Ordered If you have labs (blood work) drawn today and your tests are completely normal, you will receive your results only by: . MyChart Message (if you have MyChart) OR . A paper copy in the mail If you have any lab test that is abnormal or we need to change your treatment, we will call you to review the results.   Testing/Procedures: None Ordered   Follow-Up: At [facility], you and your health needs are our priority.  As part of our continuing mission to provide you with exceptional heart care, we have created designated Provider Care Teams.  These Care Teams include your primary Cardiologist (physician) and Advanced Practice Providers (APPs -  Physician Assistants and Nurse Practitioners) who all work together to provide you with the care you need, when you need it.  We recommend signing up for the patient portal called "MyChart".  Sign up information is provided on this After Visit Summary.  MyChart is used to connect with patients for Virtual Visits (Telemedicine).  Patients are able to view lab/test results, encounter notes, upcoming appointments, etc.  Non-urgent messages can be sent to your provider as well.   To learn more about what you can do with MyChart, go to [email or web address].    Your next appointment:   6 month(s)  The format for your next appointment:   Either In Person or Virtual  Provider:   You may see Philip Nahser, MD or one of the following Advanced Practice Providers on your designated Care Team:    Scott Weaver, PA-C  Vin Bhagat, PA-C  Janine Hammond, NP     

## 2019-05-13 NOTE — Progress Notes (Signed)
Elvera Maria  Date of Birth  12-11-1930 [facility] [address]    [place], [place]  [address] [phone]  Fax  [phone]  Problems: 1. Atrial fibrillation 2. Remote stroke 3. Aortic insufficiency 4. Hypertension 5. Hypercholesterolemia    86 yo Hx of a-Fib, remote stroke, HTN, hypercholesterolemia.  Her last visit we added amlodipine 2.5 mg a day. She also started taking her Benicar 20 mg twice a day instead of 40 mg at once. She is feeling quite a bit better. Her blood pressure readings have stabilized. She still occasionally has some signs of mild hypertension with a systolic blood pressure 259.      She also has episodes where her blood pressure will become fairly low especially at night. They're given some nights when she has held her medication for several hours because her blood pressure was in the 90 range.  She's not been limited by chest pain or shortness of breath.  She has been having some problems with hypertension.  We added amlodipine 2.5 mg at her last visit.  Her blood pressures have been better.    Jul 11, 2012:  She has done well after her left hip replacement.   She has some dyspnea but is overall doing well.   She has stopped her Benicar and is feeling much better.  She was having low BP readings.   September 07, 2014   Julio is doing well .  No CP .  Mild dyspnea with exercise.  Jul 28, 2015:  Has some DOE with mowing the grass and vacuuming . Echo in 2014 shows normal LV function. She did have moderate pulmonary HTN ( PA pressure of 46)  BP has been ok at home. A bit elevated here.  Has a chronic cough  Records from Dr. Melford Aase were reviewed.   Labs look great.   Nov. 30, 2017:  Rokhaya is seen today for follow up of her atrial fib and pulmonary HTN. Has shortness of breath with exertion .  The lasix has not helped.  Has seen a pulmonologist .  August 16, 2016:    Malayna has had some low BP readings,  Has had presyncope Has  shingles in her right eye  Was started on Anoro ( inhaler) and is breathing much better.   Dec. 5, 2018: Sharley is feeling well. Is losing the vision in her right eye.   is on Anora ( new inhaler ) has mild pulmonary HTN by echo  Has greatly reduced her salt intake   September 28, 2017:  K seen today for follow-up of her chronic atrial fibrillation.  She is also having some eye problems and is on Diamox.  She has a history of mild pulmonary hypertension.  Blood pressure remains well controlled. Is blind in her right eye.  WBC is low - going to hem-Onc doctor today   Mar 27, 2018  Louanne  is seen today for her chronic AFib .   She also has moderate pulmonary HTN - seems to be stable  Thinks her HR has increased recently   Total chol = 134 HDL is 63 Trigs = 50   She is on ASA 81 mg as well as Xarelto We will DC ASA today   Oct. 15, 2020 Yoona is seen today for follow up of her atrial fib and moderate pulmonary HTN Seems to be stable.   Has some DOE if she over does it . Able to mow grass  and do housework .   May 13, 2019: Fionnuala is seen today for a follow-up visit of her atrial fibrillation, moderate pulmonary hypertension and systemic hypertension.  Her blood pressure is actually low today. Able to do all of her normal activities.  No cp, no changes in her dyspnea.    She never has any dizziness     Current Outpatient Medications on File Prior to Visit  Medication Sig Dispense Refill  . ANORO ELLIPTA 62.5-25 MCG/INH AEPB INHALE 1 PUFF BY MOUTH EVERY DAY 60 each 11  . aspirin 81 MG EC tablet Take 81 mg by mouth daily.    Marland Kitchen atorvastatin (LIPITOR) 20 MG tablet Take 1 tablet (20 mg total) by mouth at bedtime. 90 tablet 3  . Calcium Citrate-Vitamin D (CITRACAL PETITES/VITAMIN D PO) Citracal + D Petites    . cholecalciferol (VITAMIN D) 1000 UNITS tablet Take 1,000 Units by mouth daily.    . Cyanocobalamin (VITAMIN B12 PO) Take 1 tablet by mouth daily.    . digoxin (LANOXIN) 0.25 MG tablet  Take 250 mcg by mouth daily.    Marland Kitchen escitalopram (LEXAPRO) 10 MG tablet Take 10 mg by mouth daily.    Marland Kitchen ibandronate (BONIVA) 150 MG tablet Take 150 mg by mouth every 30 (thirty) days.    Marland Kitchen latanoprost (XALATAN) 0.005 % ophthalmic solution Place 1-2 drops into the left eye daily.     . nebivolol (BYSTOLIC) 2.5 MG tablet Take 0.5 tablets (1.25 mg total) by mouth daily. 45 tablet 3  . Netarsudil Dimesylate (RHOPRESSA OP) Apply 1 drop to eye daily.     Marland Kitchen nystatin-triamcinolone (MYCOLOG II) cream Apply 1 application topically as needed.    . prednisoLONE acetate (PRED FORTE) 1 % ophthalmic suspension Place 1 drop into the right eye 3 (three) times daily.    Marland Kitchen SIMBRINZA 1-0.2 % SUSP Place 1 drop into the right eye 3 (three) times daily.   0  . SYSTANE ULTRA 0.4-0.3 % SOLN Place 1 drop into both eyes as needed.    . timolol (TIMOPTIC) 0.5 % ophthalmic solution Place 1 drop into the right eye 2 (two) times daily.    Alveda Reasons 20 MG TABS tablet TAKE 1 TABLET BY MOUTH DAILY WITH SUPPER 90 tablet 2  . ZOLOFT 50 MG tablet Take 50 mg by mouth daily.     No current facility-administered medications on file prior to visit.    Allergies  Allergen Reactions  . Benzonatate Other (See Comments) and Rash    Unknown reaction Unknown reaction Unknown reaction   . Shrimp [Shellfish Allergy] Anaphylaxis and Swelling  . Dabigatran Diarrhea and Nausea And Vomiting  . Dabigatran Etexilate Mesylate Other (See Comments) and Rash    "sick to my stomach and lost weight" "sick to my stomach and lost weight" Upset stomach"Pradaxa" "sick to my stomach and lost weight" Upset stomach"Pradaxa"   . Diclofenac Other (See Comments) and Rash    Either dizzines, GI upset Either dizzines, GI upset   . Alendronate Sodium Other (See Comments)    Eyes and head hurt  . Dabigatran Etexilate Mesylate Other (See Comments)    Upset stomach"Pradaxa"  . Diamox [Acetazolamide]     Excessive sleepiness  . Levofloxacin Other (See  Comments)    "made her funny in the head" "made her funny in the head"  . Sulfonamide Derivatives Other (See Comments)    Unknown reaction  . Tramadol Other (See Comments) and Nausea And Vomiting    "made me feel  funny in my head" "made me feel funny in my head"    Past Medical History:  Diagnosis Date  . Anxiety   . Aortic insufficiency   . Arthritis   . Atrial fibrillation ([place])   . Chronic anticoagulation   . Colitis   . GERD (gastroesophageal reflux disease)   . Glaucoma    both eyes  . Gout   . Hyperlipidemia   . Hypertension   . Melanoma of nose ([place])    "was only a precancer area"-no problems since  . SOB (shortness of breath)   . Stroke [facility])     2007 and  TIA-3'08, none recent  . TIA (transient ischemic attack)   . Tinnitus of both ears     Past Surgical History:  Procedure Laterality Date  . BLEPHAROPLASTY     bilateral  . CARDIOVERSION     Unsuccessful  . cataracts     bilateral  . CHOLECYSTECTOMY N/A 02/26/2013   Procedure: LAPAROSCOPIC CHOLECYSTECTOMY WITH INTRAOPERATIVE CHOLANGIOGRAM;  Surgeon: Edward Jolly, MD;  Location: [facility];  Service: General;  Laterality: N/A;  . colon polyps     removed via colonoscopy  . eye surgeries     Multiple eye surgeries for torned retina-bilateral  . FRACTURE SURGERY     ORIF-Lt. hip -,removed hardware  . TONSILLECTOMY    . TOTAL HIP ARTHROPLASTY  04/03/2012   Procedure: TOTAL HIP ARTHROPLASTY ANTERIOR APPROACH;  Surgeon: Gearlean Alf, MD;  Location: [facility];  Service: Orthopedics;  Laterality: Left;    Social History   Tobacco Use  Smoking Status Former Smoker  . Packs/day: 0.50  . Years: 25.00  . Pack years: 12.50  . Types: Cigarettes  . Quit date: 03/13/1990  . Years since quitting: 29.1  Smokeless Tobacco Never Used    Social History   Substance and Sexual Activity  Alcohol Use Yes  . Alcohol/week: 5.0 standard drinks  . Types: 5 drink(s) per week   Comment: wine daily    Family  History  Problem Relation Age of Onset  . Heart disease Mother     Reviw of Systems:  Reviewed in the HPI.  All other systems are negative.  Physical Exam: Blood pressure (!) 98/56, pulse 70, height 5\' 7"  (1.702 m), weight 132 lb 12 oz (60.2 kg), SpO2 97 %.  GEN:  Well nourished, well developed in no acute distress HEENT: Normal NECK: No JVD; No carotid bruits LYMPHATICS: No lymphadenopathy CARDIAC: irreg. Irreg.   Soft systlolic murmur    RESPIRATORY:  Clear to auscultation without rales, wheezing or rhonchi  ABDOMEN: Soft, non-tender, non-distended MUSCULOSKELETAL:  No edema; No deformity  SKIN: Warm and dry NEUROLOGIC:  Alert and oriented x 3   ECG:        Assessment / Plan:   1.  Shortness of breath   : Her breathing is stable.  She has moderate pulmonary hypertension.  We tried her on higher doses of Lasix but this did not work any better.  Continue same medications.   1. Atrial fibrillation   : Her atrial fibrillation is stable.  Rate is fairly well controlled.  2. Remote stroke-     3. Aortic insufficiency-heart exam of sounds stable.   4. Hypertension Blood pressure seems to be well controlled.  Continue current medications.    5. Hypercholesterolemia -we will check labs today.   Mertie Moores, MD  05/13/2019 10:14 AM    [address],  [place], [place]  [address] Pager [phone] Phone: [phone]; Fax: [phone]

## 2019-07-29 ENCOUNTER — Telehealth: Payer: Self-pay | Admitting: Cardiovascular Disease

## 2019-07-29 MED ORDER — RIVAROXABAN 15 MG PO TABS: 15.0000 mg | ORAL_TABLET | Freq: Every day | ORAL | 11 refills | Status: DC

## 2019-07-29 NOTE — Telephone Encounter (Signed)
Patient called with concerns that she has not had lab work recently to check her Xarelto level; states these levels were checked more frequently in the past. I advised that CBC and BMET are the labs that we monitor yearly for Xarelto along with her weight. Patient had lab work with Dr. Melford Aase, PCP, just a few weeks ago which is visible on Care Everywhere. I advised patient I will forward that information to Pharm D for advice regarding patient's dose of Xarelto and will call her back to let her know if any change is advised. Patient verbalized understanding and agreement with plan and thanked me for the call.

## 2019-07-29 NOTE — Telephone Encounter (Signed)
CBC stable from 06/23/19. SCr 0.82, age 86, weight 60.2kg on 05/13/19 visit, CrCl now 53mL/min due to age and low weight, even though renal function is normal. Since CrCl < 50, she will require dose reduction of Xarelto to 15mg  once daily. Would recommend rechecking BMET in 6 months so we can keep a closer eye on her labs and Xarelto dosing

## 2019-07-29 NOTE — Telephone Encounter (Signed)
Natalie Ho is calling requesting blood work to check her Xarelto levels. Please put in an active request and call the patient to schedule.

## 2019-07-29 NOTE — Telephone Encounter (Signed)
Called patient and reviewed advice from [place], [facility]. Patient is aware to pick up new Xarelto Rx and begin taking 15 mg tablets once daily. She is aware that we will recheck her kidney function in 6 months. I advised her to call back prior to appointment with any questions or concerns and she thanked me for the call.

## 2019-11-04 ENCOUNTER — Encounter: Payer: Self-pay | Admitting: Internal Medicine

## 2019-11-04 ENCOUNTER — Other Ambulatory Visit: Payer: Self-pay

## 2019-11-04 ENCOUNTER — Ambulatory Visit (INDEPENDENT_AMBULATORY_CARE_PROVIDER_SITE_OTHER): Payer: Medicare Other | Admitting: Internal Medicine

## 2019-11-04 DIAGNOSIS — J449 Chronic obstructive pulmonary disease, unspecified: Secondary | ICD-10-CM | POA: Diagnosis not present

## 2019-11-04 DIAGNOSIS — I482 Chronic atrial fibrillation, unspecified: Secondary | ICD-10-CM

## 2019-11-04 MED ORDER — TRELEGY ELLIPTA 100-62.5-25 MCG/INH IN AEPB: INHALATION_SPRAY | RESPIRATORY_TRACT | 12 refills | Status: DC

## 2019-11-04 NOTE — Progress Notes (Signed)
HPI female former smoker followed for COPD, complicated by history lung nodule, GERD, A. fib, HBP, TIA, glaucoma   PFT: 04/20/2011-mild obstructive airways disease with insignificant response to bronchodilator, air trapping, diffusion mildly reduced. FEV1/FVC 0.63, DLCO 76%. 6 minute walk test-97%, 96%, 98%, 513 m. Well-maintained oxygenation. CT chest 04/20/12 IMPRESSION:  1. No evidence of acute pulmonary embolism.  2. Small pleural effusions, cardiomegaly, and slightly prominent  interstitial markings may indicate very mild interstitial edema.  3. Calcified mediastinal and hilar nodes with calcified right  upper lobe granuloma consistent with prior granulomatous disease.  Original Report Authenticated By: Ivar Drape, M.D PFT 02/23/2016- Severe obstructive airways disease minimal response to bronchodilator, air trapping, overinflation, diffusion reduced. Emphysema pattern. FVC 1.86/66%, FEV1 1.22/57%, ratio 0.65, RV 219%, TLC 132%, DLCO 21%  ----------------------------------------------------------- 11/04/2018- 86 year old female former smoker followed for COPD, DOE, complicated by history lung nodule, GERD, A. fib, HBP, TIA, Glaucoma/ blind R eye -----f/u for COPD; pt states her breathing varies; taking Anoro daily Denies changes or acute respiratory events since last here. Being Covid careful- discussed.   11/04/19- 86 year old female former smoker followed for COPD, DOE, complicated by history lung nodule, GERD, A. Fib, HBP, TIA, Glaucoma/ blind R eye -----copd,states DOE, cough, mostly non productive, but does have phlegm at times Anoro,  Had 2 Moderna Covax Some DOE with greater than casual activity. Mild cough with scant phlegm.  She asks "how long before I die", but denies acute issues.  CXR 11/04/2018- IMPRESSION: Cardiomegaly.  No active disease.  ROS-see HPI   + = positive Constitutional:   No-   weight loss, night sweats, fevers, chills, fatigue, lassitude. HEENT:   No-   headaches, difficulty swallowing, tooth/dental problems, sore throat,       No-  sneezing, itching, ear ache, nasal congestion, post nasal drip,  CV:  No-   chest pain, orthopnea, PND, swelling in lower extremities, anasarca,  dizziness, palpitations Resp: +  shortness of breath with exertion or at rest.              +productive cough,  +non-productive cough,  No- coughing up of blood.                change in color of mucus.   wheezing.   Skin: No-   rash or lesions. GI:  No-   heartburn, indigestion, abdominal pain, nausea, vomiting, GU: . MS:  No-   joint pain or swelling.   Neuro-     nothing unusual Psych:  No- change in mood or affect. No depression or anxiety.  + memory loss.  OBJ- Physical Exam General- +Very Alert, Oriented, Affect-appropriate, Distress- none acute Skin- rash-none, lesions- none, excoriation- none Lymphadenopathy- none Head- atraumatic            Eyes- Gross vision intact, PERRLA, conjunctivae and secretions clear            Ears- Hearing, canals-normal            Nose- Clear, no-Septal dev, mucus, polyps, erosion, perforation             Throat- Mallampati II , mucosa clear , drainage- none, tonsils- atrophic Neck- flexible , trachea midline, no stridor , thyroid nl, carotid no bruit Chest - symmetrical excursion , unlabored           Heart/CV- +IRR/ Afib, no murmur , no gallop  , no rub, nl s1 s2                           -  JVD- none , edema- none, stasis changes- none, varices- none           Lung- clear to P&A,  cough-none , dullness-none, rub- none, wheeze- none           Chest wall-  Abd-  Br/ Gen/ Rectal- Not done, not indicated Extrem- cyanosis- none, clubbing, none, atrophy- none, strength- nl Neuro- grossly intact to observation

## 2019-11-04 NOTE — Patient Instructions (Signed)
Script for Trelegy inhaler sent to [facility].  This is a step up from Anoro. Try it for a month- inhale 1 puff then rinse mouth, once daily. If you like it better than Anoro, you can stick with Trelegy. Otherwise go back to A/noro. Don't use them both- its one or the other.  Pace yourself as you need to when you are exerting yourself, so you don't over-do it.   Please call if we can help

## 2019-11-09 NOTE — Progress Notes (Signed)
Natalie Ho  Date of Birth  12-11-1930 [facility] [address]    [place], [place]  [address] [phone]  Fax  [phone]  Problems: 1. Atrial fibrillation 2. Remote stroke 3. Aortic insufficiency 4. Hypertension 5. Hypercholesterolemia    86 yo Hx of a-Fib, remote stroke, HTN, hypercholesterolemia.  Her last visit we added amlodipine 2.5 mg a day. She also started taking her Benicar 20 mg twice a day instead of 40 mg at once. She is feeling quite a bit better. Her blood pressure readings have stabilized. She still occasionally has some signs of mild hypertension with a systolic blood pressure 259.      She also has episodes where her blood pressure will become fairly low especially at night. They're given some nights when she has held her medication for several hours because her blood pressure was in the 90 range.  She's not been limited by chest pain or shortness of breath.  She has been having some problems with hypertension.  We added amlodipine 2.5 mg at her last visit.  Her blood pressures have been better.    Jul 11, 2012:  She has done well after her left hip replacement.   She has some dyspnea but is overall doing well.   She has stopped her Benicar and is feeling much better.  She was having low BP readings.   September 07, 2014   Natalie Ho is doing well .  No CP .  Mild dyspnea with exercise.  Jul 28, 2015:  Has some DOE with mowing the grass and vacuuming . Echo in 2014 shows normal LV function. She did have moderate pulmonary HTN ( PA pressure of 46)  BP has been ok at home. A bit elevated here.  Has a chronic cough  Records from Dr. Melford Aase were reviewed.   Labs look great.   Nov. 30, 2017:  Natalie Ho is seen today for follow up of her atrial fib and pulmonary HTN. Has shortness of breath with exertion .  The lasix has not helped.  Has seen a pulmonologist .  August 16, 2016:    Natalie Ho has had some low BP readings,  Has had presyncope Has  shingles in her right eye  Was started on Anoro ( inhaler) and is breathing much better.   Dec. 5, 2018: Natalie Ho is feeling well. Is losing the vision in her right eye.   is on Anora ( new inhaler ) has mild pulmonary HTN by echo  Has greatly reduced her salt intake   September 28, 2017:  Natalie Ho seen today for follow-up of her chronic atrial fibrillation.  She is also having some eye problems and is on Diamox.  She has a history of mild pulmonary hypertension.  Blood pressure remains well controlled. Is blind in her right eye.  WBC is low - going to hem-Onc doctor today   Mar 27, 2018  Natalie Ho  is seen today for her chronic AFib .   She also has moderate pulmonary HTN - seems to be stable  Thinks her HR has increased recently   Total chol = 134 HDL is 63 Trigs = 50   She is on ASA 81 mg as well as Xarelto We will DC ASA today   Oct. 15, 2020 Natalie Ho is seen today for follow up of her atrial fib and moderate pulmonary HTN Seems to be stable.   Has some DOE if she over does it . Able to mow grass  and do housework .   May 13, 2019: Natalie Ho is seen today for a follow-up visit of her atrial fibrillation, moderate pulmonary hypertension and systemic hypertension.  Her blood pressure is actually low today. Able to do all of her normal activities.  No cp, no changes in her dyspnea.    She never has any dizziness   Aug. 30, 2021: Natalie Ho is seen today for follow up of her atrial fib, pulmonary HTN, systemic HTN. Is still have significant DOE  Has seen pulmonary - did not have much more to offer  We discussed having her see the Advanced CHF clinci    Current Outpatient Medications on File Prior to Visit  Medication Sig Dispense Refill  . ANORO ELLIPTA 62.5-25 MCG/INH AEPB INHALE 1 PUFF BY MOUTH EVERY DAY 60 each 11  . aspirin 81 MG EC tablet Take 81 mg by mouth daily.    Marland Kitchen atorvastatin (LIPITOR) 20 MG tablet Take 1 tablet (20 mg total) by mouth at bedtime. 90 tablet 3  . Calcium Citrate-Vitamin D (CITRACAL  PETITES/VITAMIN D PO) Citracal + D Petites    . cholecalciferol (VITAMIN D) 1000 UNITS tablet Take 1,000 Units by mouth daily.    . Cyanocobalamin (VITAMIN B12 PO) Take 1 tablet by mouth daily.    . digoxin (LANOXIN) 0.25 MG tablet Take 250 mcg by mouth daily.    Marland Kitchen escitalopram (LEXAPRO) 10 MG tablet Take 10 mg by mouth daily.    Marland Kitchen gabapentin (NEURONTIN) 100 MG capsule Take 100 mg by mouth daily.    Marland Kitchen ibandronate (BONIVA) 150 MG tablet Take 150 mg by mouth every 30 (thirty) days.    Marland Kitchen latanoprost (XALATAN) 0.005 % ophthalmic solution Place 1-2 drops into the left eye daily.     . nebivolol (BYSTOLIC) 2.5 MG tablet Take 0.5 tablets (1.25 mg total) by mouth daily. 45 tablet 3  . neomycin-polymyxin b-dexamethasone (MAXITROL) 3.5-10000-0.1 OINT Place 1 application into the right eye 2 (two) times daily.    Natalie Ho (RHOPRESSA OP) Apply 1 drop to eye daily.     Marland Kitchen nystatin-triamcinolone (MYCOLOG II) cream Apply 1 application topically as needed.    . prednisoLONE acetate (PRED FORTE) 1 % ophthalmic suspension Place 1 drop into the right eye 3 (three) times daily.    . Rivaroxaban (XARELTO) 15 MG TABS tablet Take 1 tablet (15 mg total) by mouth daily with supper. 30 tablet 11  . SIMBRINZA 1-0.2 % SUSP Place 1 drop into the right eye 3 (three) times daily.   0  . SYSTANE ULTRA 0.4-0.3 % SOLN Place 1 drop into both eyes as needed.     No current facility-administered medications on file prior to visit.    Allergies  Allergen Reactions  . Benzonatate Other (See Comments) and Rash    Unknown reaction Unknown reaction Unknown reaction   . Shrimp [Shellfish Allergy] Anaphylaxis and Swelling  . Dabigatran Diarrhea and Nausea And Vomiting  . Dabigatran Etexilate Mesylate Other (See Comments) and Rash    "sick to my stomach and lost weight" "sick to my stomach and lost weight" Upset stomach"Pradaxa" "sick to my stomach and lost weight" Upset stomach"Pradaxa"   . Diclofenac Other  (See Comments) and Rash    Either dizzines, GI upset Either dizzines, GI upset   . Alendronate Sodium Other (See Comments)    Eyes and head hurt  . Dabigatran Etexilate Mesylate Other (See Comments)    Upset stomach"Pradaxa"  . Diamox [Acetazolamide]     Excessive  sleepiness  . Levofloxacin Other (See Comments)    "made her funny in the head" "made her funny in the head"  . Sulfonamide Derivatives Other (See Comments)    Unknown reaction  . Tramadol Other (See Comments) and Nausea And Vomiting    "made me feel funny in my head" "made me feel funny in my head"    Past Medical History:  Diagnosis Date  . Anxiety   . Aortic insufficiency   . Arthritis   . Atrial fibrillation ([place])   . Chronic anticoagulation   . Colitis   . GERD (gastroesophageal reflux disease)   . Glaucoma    both eyes  . Gout   . Hyperlipidemia   . Hypertension   . Melanoma of nose ([place])    "was only a precancer area"-no problems since  . SOB (shortness of breath)   . Stroke [facility])     2007 and  TIA-3'08, none recent  . TIA (transient ischemic attack)   . Tinnitus of both ears     Past Surgical History:  Procedure Laterality Date  . BLEPHAROPLASTY     bilateral  . CARDIOVERSION     Unsuccessful  . cataracts     bilateral  . CHOLECYSTECTOMY N/A 02/26/2013   Procedure: LAPAROSCOPIC CHOLECYSTECTOMY WITH INTRAOPERATIVE CHOLANGIOGRAM;  Surgeon: Edward Jolly, MD;  Location: [facility];  Service: General;  Laterality: N/A;  . colon polyps     removed via colonoscopy  . eye surgeries     Multiple eye surgeries for torned retina-bilateral  . FRACTURE SURGERY     ORIF-Lt. hip -,removed hardware  . TONSILLECTOMY    . TOTAL HIP ARTHROPLASTY  04/03/2012   Procedure: TOTAL HIP ARTHROPLASTY ANTERIOR APPROACH;  Surgeon: Gearlean Alf, MD;  Location: [facility];  Service: Orthopedics;  Laterality: Left;    Social History   Tobacco Use  Smoking Status Former Smoker  . Packs/day: 0.50  . Years: 25.00   . Pack years: 12.50  . Types: Cigarettes  . Quit date: 03/13/1990  . Years since quitting: 29.6  Smokeless Tobacco Never Used    Social History   Substance and Sexual Activity  Alcohol Use Yes  . Alcohol/week: 5.0 standard drinks  . Types: 5 drink(s) per week   Comment: wine daily    Family History  Problem Relation Age of Onset  . Heart disease Mother     Reviw of Systems:  Reviewed in the HPI.  All other systems are negative.  Physical Exam: Blood pressure 124/68, pulse 72, height 5\' 7"  (1.702 m), weight 135 lb 3.2 oz (61.3 kg), SpO2 97 %.  GEN: Elderly, healthy female, no acute distress HEENT: Normal NECK: No JVD; No carotid bruits LYMPHATICS: No lymphadenopathy CARDIAC: irreg. Irreg.  RESPIRATORY:  Clear to auscultation without rales, wheezing or rhonchi  ABDOMEN: Soft, non-tender, non-distended MUSCULOSKELETAL:  No edema; No deformity  SKIN: Warm and dry NEUROLOGIC:  Alert and oriented x 3   ECG:       Aug. 30, 2021:  A-fib with slow vent response    Assessment / Plan:   1.  Shortness of breath   : Yvone Neu continues to have problems with pulmonary hypertension.  She has seen pulmonary for COPD and I do not have much more to offer.  I would like to refer her to our advanced heart failure clinic for further evaluation of her pulmonary hypertension.  She may be a candidate for some of the pulmonary vasodilators.   1. Atrial  fibrillation   : Heart rate is well controlled.  Continue Xarelto for now.  2. Remote stroke-     3. Aortic insufficiency-heart exam of sounds stable.   4. Hypertension-  blood pressure is well controlled.     5. Hypercholesterolemia -  Stable    Mertie Moores, MD  11/10/2019 4:22 PM    [facility] [place],  [place], [place]  [address] Pager [phone] Phone: [phone]; Fax: [phone]

## 2019-11-10 ENCOUNTER — Other Ambulatory Visit: Payer: Self-pay

## 2019-11-10 ENCOUNTER — Encounter: Payer: Self-pay | Admitting: Cardiovascular Disease

## 2019-11-10 ENCOUNTER — Ambulatory Visit (INDEPENDENT_AMBULATORY_CARE_PROVIDER_SITE_OTHER): Payer: Medicare Other | Admitting: Cardiovascular Disease

## 2019-11-10 VITALS — BP 124/68 | HR 72 | Ht 67.0 in | Wt 135.2 lb

## 2019-11-10 DIAGNOSIS — I351 Nonrheumatic aortic (valve) insufficiency: Secondary | ICD-10-CM | POA: Diagnosis not present

## 2019-11-10 DIAGNOSIS — I272 Pulmonary hypertension, unspecified: Secondary | ICD-10-CM | POA: Diagnosis not present

## 2019-11-10 DIAGNOSIS — I482 Chronic atrial fibrillation, unspecified: Secondary | ICD-10-CM

## 2019-11-10 DIAGNOSIS — I10 Essential (primary) hypertension: Secondary | ICD-10-CM

## 2019-11-10 NOTE — Patient Instructions (Signed)
Medication Instructions:  Your physician recommends that you continue on your current medications as directed. Please refer to the Current Medication list given to you today.  *If you need a refill on your cardiac medications before your next appointment, please call your pharmacy*   Lab Work: None Ordered If you have labs (blood work) drawn today and your tests are completely normal, you will receive your results only by: Marland Kitchen MyChart Message (if you have MyChart) OR . A paper copy in the mail If you have any lab test that is abnormal or we need to change your treatment, we will call you to review the results.   Testing/Procedures: None Ordered   Follow-Up: You have been referred to [facility] - you will receive a call for an appointment   At [facility], you and your health needs are our priority.  As part of our continuing mission to provide you with exceptional heart care, we have created designated Provider Care Teams.  These Care Teams include your primary Cardiologist (physician) and Advanced Practice Providers (APPs -  Physician Assistants and Nurse Practitioners) who all work together to provide you with the care you need, when you need it.  We recommend signing up for the patient portal called "MyChart".  Sign up information is provided on this After Visit Summary.  MyChart is used to connect with patients for Virtual Visits (Telemedicine).  Patients are able to view lab/test results, encounter notes, upcoming appointments, etc.  Non-urgent messages can be sent to your provider as well.   To learn more about what you can do with MyChart, go to [email or web address].    Your next appointment:   6 month(s) on Monday Feb. 28 at 2:40 pm  The format for your next appointment:   In Person  Provider:   Mertie Moores, MD

## 2019-11-19 NOTE — Assessment & Plan Note (Signed)
Continues cardiology f/u. 

## 2019-11-19 NOTE — Assessment & Plan Note (Signed)
Adequate control meds effective. DOE reflects cardiac and pulmonary limitation. Plan- Try Trelegy for comparison with Anoro

## 2019-12-24 ENCOUNTER — Other Ambulatory Visit: Payer: Self-pay | Admitting: Internal Medicine

## 2019-12-26 ENCOUNTER — Inpatient Hospital Stay (HOSPITAL_COMMUNITY): Admission: RE | Admit: 2019-12-26 | Payer: Medicare Other | Source: Ambulatory Visit | Admitting: Internal Medicine

## 2020-01-27 ENCOUNTER — Encounter (HOSPITAL_COMMUNITY): Payer: Self-pay | Admitting: Internal Medicine

## 2020-01-27 ENCOUNTER — Ambulatory Visit (HOSPITAL_COMMUNITY)
Admission: RE | Admit: 2020-01-27 | Discharge: 2020-01-27 | Disposition: A | Discharge status: Home / Self Care | Payer: Medicare Other | Source: Ambulatory Visit | Attending: Internal Medicine | Admitting: Internal Medicine

## 2020-01-27 ENCOUNTER — Other Ambulatory Visit: Payer: Self-pay

## 2020-01-27 VITALS — BP 126/80 | HR 73 | Wt 191.6 lb

## 2020-01-27 DIAGNOSIS — I10 Essential (primary) hypertension: Secondary | ICD-10-CM | POA: Insufficient documentation

## 2020-01-27 DIAGNOSIS — Z7901 Long term (current) use of anticoagulants: Secondary | ICD-10-CM | POA: Insufficient documentation

## 2020-01-27 DIAGNOSIS — Z8249 Family history of ischemic heart disease and other diseases of the circulatory system: Secondary | ICD-10-CM | POA: Insufficient documentation

## 2020-01-27 DIAGNOSIS — I482 Chronic atrial fibrillation, unspecified: Secondary | ICD-10-CM | POA: Diagnosis not present

## 2020-01-27 DIAGNOSIS — Z881 Allergy status to other antibiotic agents status: Secondary | ICD-10-CM | POA: Insufficient documentation

## 2020-01-27 DIAGNOSIS — Z888 Allergy status to other drugs, medicaments and biological substances status: Secondary | ICD-10-CM | POA: Diagnosis not present

## 2020-01-27 DIAGNOSIS — Z885 Allergy status to narcotic agent status: Secondary | ICD-10-CM | POA: Diagnosis not present

## 2020-01-27 DIAGNOSIS — Z87891 Personal history of nicotine dependence: Secondary | ICD-10-CM | POA: Insufficient documentation

## 2020-01-27 DIAGNOSIS — K219 Gastro-esophageal reflux disease without esophagitis: Secondary | ICD-10-CM | POA: Insufficient documentation

## 2020-01-27 DIAGNOSIS — R0600 Dyspnea, unspecified: Secondary | ICD-10-CM

## 2020-01-27 DIAGNOSIS — H5461 Unqualified visual loss, right eye, normal vision left eye: Secondary | ICD-10-CM | POA: Insufficient documentation

## 2020-01-27 DIAGNOSIS — R0609 Other forms of dyspnea: Secondary | ICD-10-CM | POA: Insufficient documentation

## 2020-01-27 DIAGNOSIS — Z8673 Personal history of transient ischemic attack (TIA), and cerebral infarction without residual deficits: Secondary | ICD-10-CM | POA: Diagnosis not present

## 2020-01-27 DIAGNOSIS — Z7952 Long term (current) use of systemic steroids: Secondary | ICD-10-CM | POA: Diagnosis not present

## 2020-01-27 DIAGNOSIS — Z7982 Long term (current) use of aspirin: Secondary | ICD-10-CM | POA: Insufficient documentation

## 2020-01-27 DIAGNOSIS — Z79899 Other long term (current) drug therapy: Secondary | ICD-10-CM | POA: Insufficient documentation

## 2020-01-27 DIAGNOSIS — H409 Unspecified glaucoma: Secondary | ICD-10-CM | POA: Insufficient documentation

## 2020-01-27 DIAGNOSIS — J449 Chronic obstructive pulmonary disease, unspecified: Secondary | ICD-10-CM | POA: Insufficient documentation

## 2020-01-27 DIAGNOSIS — Z882 Allergy status to sulfonamides status: Secondary | ICD-10-CM | POA: Diagnosis not present

## 2020-01-27 DIAGNOSIS — I272 Pulmonary hypertension, unspecified: Secondary | ICD-10-CM | POA: Diagnosis not present

## 2020-01-27 LAB — BASIC METABOLIC PANEL
Anion gap: 9 (ref 5–15)
BUN: 9 mg/dL (ref 8–23)
CO2: 27 mmol/L (ref 22–32)
Calcium: 9.3 mg/dL (ref 8.9–10.3)
Chloride: 98 mmol/L (ref 98–111)
Creatinine, Ser: 0.73 mg/dL (ref 0.44–1.00)
GFR, Estimated: >60 mL/min (ref >60)
Glucose, Bld: 92 mg/dL (ref 70–99)
Potassium: 4.3 mmol/L (ref 3.5–5.1)
Sodium: 134 mmol/L — ABNORMAL LOW (ref 135–145)

## 2020-01-27 LAB — BRAIN NATRIURETIC PEPTIDE: B Natriuretic Peptide: 357.4 pg/mL — ABNORMAL HIGH (ref 0.0–100.0)

## 2020-01-27 LAB — DIGOXIN LEVEL: Digoxin Level: 0.2 ng/mL — ABNORMAL LOW (ref 1.0–2.0)

## 2020-01-27 MED ORDER — DIGOXIN 250 MCG PO TABS
125.0000 ug | ORAL_TABLET | Freq: Every day | ORAL | 3 refills | Status: DC
Start: 2020-01-27 — End: 2020-10-29

## 2020-01-27 NOTE — Consult Note (Signed)
ADVANCED HF CLINIC CONSULT NOTE  Referring Physician: Dr. Acie Fredrickson Primary Care: Chesley Noon, MD Primary Cardiologist: Dr. Acie Fredrickson   HPI:  Natalie Ho is an 86 year old female former smoker wtih severe COPD, GERD, A. Fib, HTN, TIA, Glaucoma/ blind R eye  Referred by Dr. Acie Fredrickson for further evaluation of ongoing dyspnea   PFTs FEV1 1.06 FVC 1.48 DLCO 38%  Echo 11/20 EF 60%. RV normal Trace MR/TR. Mild AI.  RVSP 21 mmhG. Severe biatrial enlargement. IVC 1.7cm  She is here with her husband. Tells me she smoked about 1/2 ppd for 30 years but hid it from her husband. Quit in 1990 when she had anaphylactic shock. Says she is only SOB when she works outside and doing stuff like mowing the grass. Can do all her activities without too much difficult. Dyspnea has ben stable for a long time. No edema, orthopnea or PND. No syncope or presyncope. Remains on Xarelto for AF. No bleeding. BP well controlled.    Review of Systems: [y] = yes, [ ]  = no   General: Weight gain [ ] ; Weight loss [ ] ; Anorexia [ ] ; Fatigue [ ] ; Fever [ ] ; Chills [ ] ; Weakness [ ]   Cardiac: Chest pain/pressure [ ] ; Resting SOB [ ] ; Exertional SOB [ y]; Orthopnea [ ] ; Pedal Edema [ ] ; Palpitations [ ] ; Syncope [ ] ; Presyncope [ ] ; Paroxysmal nocturnal dyspnea[ ]   Pulmonary: Cough [ ] ; Wheezing[ ] ; Hemoptysis[ ] ; Sputum [ ] ; Snoring [ ]   GI: Vomiting[ ] ; Dysphagia[ ] ; Melena[ ] ; Hematochezia [ ] ; Heartburn[ ] ; Abdominal pain [ ] ; Constipation [ ] ; Diarrhea [ ] ; BRBPR [ ]   GU: Hematuria[ ] ; Dysuria [ ] ; Nocturia[ ]   Vascular: Pain in legs with walking [ ] ; Pain in feet with lying flat [ ] ; Non-healing sores [ ] ; Stroke [ ] ; TIA [ ] ; Slurred speech [ ] ;  Neuro: Headaches[ ] ; Vertigo[ ] ; Seizures[ ] ; Paresthesias[ ] ;Blurred vision [ ] ; Diplopia [ ] ; Vision changes [ ]   Ortho/Skin: Arthritis Blue.Reese ]; Joint pain [ y]; Muscle pain [ ] ; Joint swelling [ ] ; Back Pain [ ] ; Rash [ ]   Psych: Depression[ ] ; Anxiety[y ]  Heme:  Bleeding problems [ ] ; Clotting disorders [ ] ; Anemia [ ]   Endocrine: Diabetes [ ] ; Thyroid dysfunction[ ]    Past Medical History:  Diagnosis Date  . Anxiety   . Aortic insufficiency   . Arthritis   . Atrial fibrillation ([place])   . Chronic anticoagulation   . Colitis   . GERD (gastroesophageal reflux disease)   . Glaucoma    both eyes  . Gout   . Hyperlipidemia   . Hypertension   . Melanoma of nose ([place])    "was only a precancer area"-no problems since  . SOB (shortness of breath)   . Stroke [facility])     2007 and  TIA-3'08, none recent  . TIA (transient ischemic attack)   . Tinnitus of both ears     Current Outpatient Medications  Medication Sig Dispense Refill  . aspirin 81 MG EC tablet Take 81 mg by mouth daily.    Marland Kitchen atorvastatin (LIPITOR) 20 MG tablet Take 1 tablet (20 mg total) by mouth at bedtime. 90 tablet 3  . Calcium Citrate-Vitamin D (CITRACAL PETITES/VITAMIN D PO) Citracal + D Petites    . cholecalciferol (VITAMIN D) 1000 UNITS tablet Take 1,000 Units by mouth daily.    . Cyanocobalamin (VITAMIN B12 PO) Take 1 tablet by mouth daily.    Marland Kitchen  digoxin (LANOXIN) 0.25 MG tablet Take 250 mcg by mouth daily.    Marland Kitchen escitalopram (LEXAPRO) 10 MG tablet Take 10 mg by mouth daily.     . Fluticasone-Umeclidin-Vilant (TRELEGY ELLIPTA) 100-62.5-25 MCG/INH AEPB Inhale into the lungs.    . ibandronate (BONIVA) 150 MG tablet Take 150 mg by mouth every 30 (thirty) days.    Marland Kitchen latanoprost (XALATAN) 0.005 % ophthalmic solution Place 1-2 drops into the left eye daily.     . nebivolol (BYSTOLIC) 2.5 MG tablet Take 1.25 mg by mouth daily.    Marland Kitchen neomycin-polymyxin b-dexamethasone (MAXITROL) 3.5-10000-0.1 OINT Place 1 application into the right eye 2 (two) times daily.    Mckinley Jewel Dimesylate (RHOPRESSA OP) Apply 1 drop to eye daily.     Marland Kitchen nystatin-triamcinolone (MYCOLOG II) cream Apply 1 application topically as needed.    . prednisoLONE acetate (PRED FORTE) 1 % ophthalmic suspension Place 1  drop into the right eye 3 (three) times daily.    . Rivaroxaban (XARELTO) 15 MG TABS tablet Take 15 mg by mouth daily with supper.    Marland Kitchen SIMBRINZA 1-0.2 % SUSP Place 1 drop into the right eye 3 (three) times daily.   0  . SYSTANE ULTRA 0.4-0.3 % SOLN Place 1 drop into both eyes as needed.     No current facility-administered medications for this encounter.    Allergies  Allergen Reactions  . Benzonatate Other (See Comments) and Rash    Unknown reaction Unknown reaction Unknown reaction   . Shrimp [Shellfish Allergy] Anaphylaxis and Swelling  . Dabigatran Diarrhea and Nausea And Vomiting  . Dabigatran Etexilate Mesylate Other (See Comments) and Rash    "sick to my stomach and lost weight" "sick to my stomach and lost weight" Upset stomach"Pradaxa" "sick to my stomach and lost weight" Upset stomach"Pradaxa"   . Diclofenac Other (See Comments) and Rash    Either dizzines, GI upset Either dizzines, GI upset   . Gabapentin Other (See Comments)  . Alendronate Sodium Other (See Comments)    Eyes and head hurt  . Dabigatran Etexilate Mesylate Other (See Comments)    Upset stomach"Pradaxa"  . Diamox [Acetazolamide]     Excessive sleepiness  . Levofloxacin Other (See Comments)    "made her funny in the head" "made her funny in the head"  . Sulfonamide Derivatives Other (See Comments)    Unknown reaction  . Tramadol Other (See Comments) and Nausea And Vomiting    "made me feel funny in my head" "made me feel funny in my head"      Social History   Socioeconomic History  . Marital status: Married    Spouse name: Not on file  . Number of children: Not on file  . Years of education: Not on file  . Highest education level: Not on file  Occupational History  . Not on file  Tobacco Use  . Smoking status: Former Smoker    Packs/day: 0.50    Years: 25.00    Pack years: 12.50    Types: Cigarettes    Quit date: 03/13/1990    Years since quitting: 29.8  . Smokeless tobacco:  Never Used  Vaping Use  . Vaping Use: Never used  Substance and Sexual Activity  . Alcohol use: Yes    Alcohol/week: 5.0 standard drinks    Types: 5 drink(s) per week    Comment: wine daily  . Drug use: No  . Sexual activity: Not Currently  Other Topics Concern  . Not on  file  Social History Narrative  . Not on file   Social Determinants of Health   Financial Resource Strain:   . Difficulty of Paying Living Expenses: Not on file  Food Insecurity:   . Worried About Charity fundraiser in the Last Year: Not on file  . Ran Out of Food in the Last Year: Not on file  Transportation Needs:   . Lack of Transportation (Medical): Not on file  . Lack of Transportation (Non-Medical): Not on file  Physical Activity:   . Days of Exercise per Week: Not on file  . Minutes of Exercise per Session: Not on file  Stress:   . Feeling of Stress : Not on file  Social Connections:   . Frequency of Communication with Friends and Family: Not on file  . Frequency of Social Gatherings with Friends and Family: Not on file  . Attends Religious Services: Not on file  . Active Member of Clubs or Organizations: Not on file  . Attends Archivist Meetings: Not on file  . Marital Status: Not on file  Intimate Partner Violence:   . Fear of Current or Ex-Partner: Not on file  . Emotionally Abused: Not on file  . Physically Abused: Not on file  . Sexually Abused: Not on file      Family History  Problem Relation Age of Onset  . Heart disease Mother     Vitals:   01/27/20 1138  BP: 126/80  Pulse: 73  SpO2: 96%  Weight: 86.9 kg (191 lb 9.6 oz)    Hall walk today done personally with her sats 95% at start  Maintained 95-97% during walk. At finish 92-93%   PHYSICAL EXAM: General:  Elderly thin appearing. No respiratory difficulty HEENT: normal Neck: supple. no JVD. Carotids 2+ bilat; no bruits. No lymphadenopathy or thryomegaly appreciated. Cor: PMI nondisplaced. Irregular rate &  rhythm. Soft AI murmur Lungs: clear but decreased throughout Abdomen: soft, nontender, nondistended. No hepatosplenomegaly. No bruits or masses. Good bowel sounds. Extremities: no cyanosis, clubbing, rash, edema Neuro: alert & oriented x 3, cranial nerves grossly intact. moves all 4 extremities w/o difficulty. Affect pleasant.  ECG:  AF 71. Septal qs. No ST-T wave abnormalities. Personally reviewed   ASSESSMENT & PLAN:  1. Dyspnea, exertional  - suspect mostly due to COPD given severe obstructive lung disease on PFTs but echo is also suggestive of restrictive physiology and this may be contributing - I walked her around the office today personally and she did quite well with no desats - ReDS clip 22% (low lung water) so doubt HF is major issue here - We discussed possibility of RHC to further assess for PAH and restrictive physiology but doubt it would add much to our management here.  - Encouraged her to remain as active as possible  2. Chronic AF - rate controlled - dig dose likely high for her - cut to 0.125 - check digoxin level  - can stop ASA in setting of Xarelto  Happy to see back on PRN basis.   Glori Bickers, MD  9:36 PM

## 2020-01-27 NOTE — Progress Notes (Signed)
ReDS Vest / Clip - 01/27/20 1200      ReDS Vest / Clip   Station Marker C    Ruler Value 25    ReDS Value Range Low volume    ReDS Actual Value 22

## 2020-01-27 NOTE — Patient Instructions (Signed)
Stop Aspirin  Decrease Digoxin to 0.125 mg (1/2 tab) daily  Lab work drawn today ,we will call you with any abnormal results  Follow up as needed

## 2020-01-28 ENCOUNTER — Other Ambulatory Visit: Payer: Self-pay

## 2020-01-28 MED ORDER — NEBIVOLOL HCL 2.5 MG PO TABS
1.2500 mg | ORAL_TABLET | Freq: Every day | ORAL | 2 refills | Status: DC
Start: 2020-01-28 — End: 2020-10-20

## 2020-03-19 ENCOUNTER — Other Ambulatory Visit: Payer: Self-pay | Admitting: Family Medicine

## 2020-03-19 DIAGNOSIS — E2839 Other primary ovarian failure: Secondary | ICD-10-CM

## 2020-03-22 ENCOUNTER — Other Ambulatory Visit: Payer: Self-pay | Admitting: Family Medicine

## 2020-03-22 DIAGNOSIS — Z780 Asymptomatic menopausal state: Secondary | ICD-10-CM

## 2020-03-24 ENCOUNTER — Other Ambulatory Visit: Payer: Medicare Other

## 2020-05-09 ENCOUNTER — Encounter: Payer: Self-pay | Admitting: Cardiovascular Disease

## 2020-05-09 NOTE — Progress Notes (Signed)
Natalie Ho  Date of Birth  Dec 31, 1930 [facility] [address]    [place], [place]  [address] [phone]  Fax  [phone]  Problems: 1. Atrial fibrillation 2. Remote stroke 3. Aortic insufficiency 4. Hypertension 5. Hypercholesterolemia 6. Dementia     87 yo Hx of a-Fib, remote stroke, HTN, hypercholesterolemia.  Her last visit we added amlodipine 2.5 mg a day. She also started taking her Benicar 20 mg twice a day instead of 40 mg at once. She is feeling quite a bit better. Her blood pressure readings have stabilized. She still occasionally has some signs of mild hypertension with a systolic blood pressure 001.      She also has episodes where her blood pressure will become fairly low especially at night. They're given some nights when she has held her medication for several hours because her blood pressure was in the 90 range.  She's not been limited by chest pain or shortness of breath.  She has been having some problems with hypertension.  We added amlodipine 2.5 mg at her last visit.  Her blood pressures have been better.    Jul 11, 2012:  She has done well after her left hip replacement.   She has some dyspnea but is overall doing well.   She has stopped her Benicar and is feeling much better.  She was having low BP readings.   September 07, 2014   Natalie Ho is doing well .  No CP .  Mild dyspnea with exercise.  Jul 28, 2015:  Has some DOE with mowing the grass and vacuuming . Echo in 2014 shows normal LV function. She did have moderate pulmonary HTN ( PA pressure of 46)  BP has been ok at home. A bit elevated here.  Has a chronic cough  Records from Dr. Melford Aase were reviewed.   Labs look great.   Nov. 30, 2017:  Ben is seen today for follow up of her atrial fib and pulmonary HTN. Has shortness of breath with exertion .  The lasix has not helped.  Has seen a pulmonologist .  August 16, 2016:    Mehar has had some low BP readings,  Has had  presyncope Has shingles in her right eye  Was started on Anoro ( inhaler) and is breathing much better.   Dec. 5, 2018: Natalie Ho is feeling well. Is losing the vision in her right eye.   is on Anora ( new inhaler ) has mild pulmonary HTN by echo  Has greatly reduced her salt intake   September 28, 2017:  Natalie Ho seen today for follow-up of her chronic atrial fibrillation.  She is also having some eye problems and is on Diamox.  She has a history of mild pulmonary hypertension.  Blood pressure remains well controlled. Is blind in her right eye.  WBC is low - going to hem-Onc doctor today   Mar 27, 2018  Natalie Ho  is seen today for her chronic AFib .   She also has moderate pulmonary HTN - seems to be stable  Thinks her HR has increased recently   Total chol = 134 HDL is 63 Trigs = 50   She is on ASA 81 mg as well as Xarelto We will DC ASA today   Oct. 15, 2020 Natalie Ho is seen today for follow up of her atrial fib and moderate pulmonary HTN Seems to be stable.   Has some DOE if she over does it . Able  to mow grass and do housework .   May 13, 2019: Natalie Ho is seen today for a follow-up visit of her atrial fibrillation, moderate pulmonary hypertension and systemic hypertension.  Her blood pressure is actually low today. Able to do all of her normal activities.  No cp, no changes in her dyspnea.    She never has any dizziness   Aug. 30, 2021: Natalie Ho is seen today for follow up of her atrial fib, pulmonary HTN, systemic HTN. Is still have significant DOE  Has seen pulmonary - did not have much more to offer  We discussed having her see the [facility]   Feb. 28, 2022:  Natalie Ho is seen today for follow up of her HTN, pulmonry HTN, Has seen Dr. Haroldine Laws. He thinks her pulmonary HTN is largely due to her COPD and restrictive lung disease ReDs clip 22%. They discussed RHC    Current Outpatient Medications on File Prior to Visit  Medication Sig Dispense Refill  . amoxicillin (AMOXIL) 500 MG tablet  SMARTSIG:4 Tablet(s) By Mouth    . ANORO ELLIPTA 62.5-25 MCG/INH AEPB 1 puff daily.    Marland Kitchen atorvastatin (LIPITOR) 20 MG tablet Take 1 tablet (20 mg total) by mouth at bedtime. 90 tablet 3  . Calcium Citrate-Vitamin D (CITRACAL PETITES/VITAMIN D PO) Citracal + D Petites    . cholecalciferol (VITAMIN D) 1000 UNITS tablet Take 1,000 Units by mouth daily.    . Cyanocobalamin (VITAMIN B12 PO) Take 1 tablet by mouth daily.    . digoxin (LANOXIN) 0.25 MG tablet Take 0.5 tablets (125 mcg total) by mouth daily. 15 tablet 3  . donepezil (ARICEPT) 10 MG tablet Take 10 mg by mouth daily.    Marland Kitchen escitalopram (LEXAPRO) 10 MG tablet Take 10 mg by mouth daily.     . Fluticasone-Umeclidin-Vilant (TRELEGY ELLIPTA) 100-62.5-25 MCG/INH AEPB Inhale into the lungs.    . ibandronate (BONIVA) 150 MG tablet Take 150 mg by mouth every 30 (thirty) days.    Marland Kitchen latanoprost (XALATAN) 0.005 % ophthalmic solution Place 1-2 drops into the left eye daily.     . nebivolol (BYSTOLIC) 2.5 MG tablet Take 0.5 tablets (1.25 mg total) by mouth daily. 45 tablet 2  . prednisoLONE acetate (PRED FORTE) 1 % ophthalmic suspension Place 1 drop into the right eye 3 (three) times daily.    . Rivaroxaban (XARELTO) 15 MG TABS tablet Take 15 mg by mouth daily with supper.    Marland Kitchen SIMBRINZA 1-0.2 % SUSP Place 1 drop into the right eye 3 (three) times daily.   0  . SYSTANE ULTRA 0.4-0.3 % SOLN Place 1 drop into both eyes as needed.     No current facility-administered medications on file prior to visit.    Allergies  Allergen Reactions  . Benzonatate Other (See Comments) and Rash    Unknown reaction Unknown reaction Unknown reaction   . Shrimp [Shellfish Allergy] Anaphylaxis and Swelling  . Dabigatran Diarrhea and Nausea And Vomiting  . Dabigatran Etexilate Mesylate Other (See Comments) and Rash    "sick to my stomach and lost weight" "sick to my stomach and lost weight" Upset stomach"Pradaxa" "sick to my stomach and lost weight" Upset  stomach"Pradaxa"   . Diclofenac Other (See Comments) and Rash    Either dizzines, GI upset Either dizzines, GI upset   . Gabapentin Other (See Comments)  . Alendronate Sodium Other (See Comments)    Eyes and head hurt  . Dabigatran Etexilate Mesylate Other (See Comments)  Upset stomach"Pradaxa"  . Diamox [Acetazolamide]     Excessive sleepiness  . Levofloxacin Other (See Comments)    "made her funny in the head" "made her funny in the head"  . Sulfonamide Derivatives Other (See Comments)    Unknown reaction  . Tramadol Other (See Comments) and Nausea And Vomiting    "made me feel funny in my head" "made me feel funny in my head"    Past Medical History:  Diagnosis Date  . Anxiety   . Aortic insufficiency   . Arthritis   . Atrial fibrillation ([place])   . Chronic anticoagulation   . Colitis   . GERD (gastroesophageal reflux disease)   . Glaucoma    both eyes  . Gout   . Hyperlipidemia   . Hypertension   . Melanoma of nose ([place])    "was only a precancer area"-no problems since  . SOB (shortness of breath)   . Stroke [facility])     2007 and  TIA-3'08, none recent  . TIA (transient ischemic attack)   . Tinnitus of both ears     Past Surgical History:  Procedure Laterality Date  . BLEPHAROPLASTY     bilateral  . CARDIOVERSION     Unsuccessful  . cataracts     bilateral  . CHOLECYSTECTOMY N/A 02/26/2013   Procedure: LAPAROSCOPIC CHOLECYSTECTOMY WITH INTRAOPERATIVE CHOLANGIOGRAM;  Surgeon: Edward Jolly, MD;  Location: [facility];  Service: General;  Laterality: N/A;  . colon polyps     removed via colonoscopy  . eye surgeries     Multiple eye surgeries for torned retina-bilateral  . FRACTURE SURGERY     ORIF-Lt. hip -,removed hardware  . TONSILLECTOMY    . TOTAL HIP ARTHROPLASTY  04/03/2012   Procedure: TOTAL HIP ARTHROPLASTY ANTERIOR APPROACH;  Surgeon: Gearlean Alf, MD;  Location: [facility];  Service: Orthopedics;  Laterality: Left;    Social History    Tobacco Use  Smoking Status Former Smoker  . Packs/day: 0.50  . Years: 25.00  . Pack years: 12.50  . Types: Cigarettes  . Quit date: 03/13/1990  . Years since quitting: 30.1  Smokeless Tobacco Never Used    Social History   Substance and Sexual Activity  Alcohol Use Yes  . Alcohol/week: 5.0 standard drinks  . Types: 5 drink(s) per week   Comment: wine daily    Family History  Problem Relation Age of Onset  . Heart disease Mother     Reviw of Systems:  Reviewed in the HPI.  All other systems are negative.  Physical Exam: Blood pressure 110/70, pulse 84, height 5\' 8"  (1.727 m), weight 126 lb 9.6 oz (57.4 kg), SpO2 93 %.  GEN:  Well nourished, well developed in no acute distress HEENT: Normal NECK: No JVD; No carotid bruits LYMPHATICS: No lymphadenopathy CARDIAC: RRR, soft systolic murur  RESPIRATORY:  Clear to auscultation without rales, wheezing or rhonchi  ABDOMEN: Soft, non-tender, non-distended MUSCULOSKELETAL:  No edema; No deformity  SKIN: Warm and dry NEUROLOGIC:  Alert and oriented x 3    ECG:       Nov. 16, 2021:   Atrial fib with controlled V response   Assessment / Plan:   1.  Shortness of breath   :     1. Atrial fibrillation:   Chronic AF ,  Stable  Cont xarelto   2. Remote stroke-     3. Aortic insufficiency-  Stable     4. Hypertension- BP is well controlled.  5. Hypercholesterolemia -    manamged by Dr. Melford Aase,  Levels look great - were reviewed.    Mertie Moores, MD  05/10/2020 2:36 PM    [address],  [place], [place]  [address] Pager [phone] Phone: [phone]; Fax: [phone]

## 2020-05-10 ENCOUNTER — Encounter: Payer: Self-pay | Admitting: Cardiovascular Disease

## 2020-05-10 ENCOUNTER — Other Ambulatory Visit: Payer: Self-pay

## 2020-05-10 ENCOUNTER — Ambulatory Visit: Payer: Medicare Other | Admitting: Cardiovascular Disease

## 2020-05-10 VITALS — BP 110/70 | HR 84 | Ht 68.0 in | Wt 126.6 lb

## 2020-05-10 DIAGNOSIS — I4811 Longstanding persistent atrial fibrillation: Secondary | ICD-10-CM

## 2020-05-10 DIAGNOSIS — I10 Essential (primary) hypertension: Secondary | ICD-10-CM

## 2020-05-10 NOTE — Patient Instructions (Signed)
Medication Instructions:  Your physician recommends that you continue on your current medications as directed. Please refer to the Current Medication list given to you today.  *If you need a refill on your cardiac medications before your next appointment, please call your pharmacy*   Lab Work: none   Testing/Procedures: none   Follow-Up: At [facility], you and your health needs are our priority.  As part of our continuing mission to provide you with exceptional heart care, we have created designated Provider Care Teams.  These Care Teams include your primary Cardiologist (physician) and Advanced Practice Providers (APPs -  Physician Assistants and Nurse Practitioners) who all work together to provide you with the care you need, when you need it.  We recommend signing up for the patient portal called "MyChart".  Sign up information is provided on this After Visit Summary.  MyChart is used to connect with patients for Virtual Visits (Telemedicine).  Patients are able to view lab/test results, encounter notes, upcoming appointments, etc.  Non-urgent messages can be sent to your provider as well.   To learn more about what you can do with MyChart, go to [email or web address].    Your next appointment:   6 month(s)  The format for your next appointment:   In Person  Provider:   Mertie Moores, MD

## 2020-07-13 ENCOUNTER — Other Ambulatory Visit: Payer: Medicare Other

## 2020-07-23 ENCOUNTER — Other Ambulatory Visit: Payer: Self-pay | Admitting: Pharmacist

## 2020-07-23 NOTE — Telephone Encounter (Signed)
Prescription refill request for Xarelto received.   Indication: afib  Last office visit: [place], 05/10/2020 Weight: 57.4 kg  Age: 87 yo  Scr: 0.73, 01/27/2020 CrCl: 48.27 ml/min   Pt is on the correct dose of Xarelto, prescription refill sent for Xarelto 15mg  daily.

## 2020-10-20 ENCOUNTER — Other Ambulatory Visit (HOSPITAL_COMMUNITY): Payer: Self-pay

## 2020-10-20 MED ORDER — NEBIVOLOL HCL 2.5 MG PO TABS
1.2500 mg | ORAL_TABLET | Freq: Every day | ORAL | 2 refills | Status: DC
Start: 2020-10-20 — End: 2021-04-21

## 2020-10-29 ENCOUNTER — Other Ambulatory Visit (HOSPITAL_COMMUNITY): Payer: Self-pay | Admitting: Internal Medicine

## 2020-11-02 NOTE — Progress Notes (Signed)
HPI female former smoker followed for COPD, complicated by history lung nodule, GERD, A. fib, HBP, TIA, glaucoma   PFT: 04/20/2011-mild obstructive airways disease with insignificant response to bronchodilator, air trapping, diffusion mildly reduced. FEV1/FVC 0.63, DLCO 76%. 6 minute walk test-97%, 96%, 98%, 513 m. Well-maintained oxygenation. CT chest 04/20/12 IMPRESSION:  1. No evidence of acute pulmonary embolism.  2. Small pleural effusions, cardiomegaly, and slightly prominent  interstitial markings may indicate very mild interstitial edema.  3. Calcified mediastinal and hilar nodes with calcified right  upper lobe granuloma consistent with prior granulomatous disease.  Original Report Authenticated By: Ivar Drape, M.D PFT 02/23/2016- Severe obstructive airways disease minimal response to bronchodilator, air trapping, overinflation, diffusion reduced. Emphysema pattern. FVC 1.86/66%, FEV1 1.22/57%, ratio 0.65, RV 219%, TLC 132%, DLCO 21%  -----------------------------------------------------------   11/04/19- 87 year old female former smoker followed for COPD, DOE, complicated by history lung nodule, GERD, A. Fib, HBP, TIA, Glaucoma/ blind R eye -----copd,states DOE, cough, mostly non productive, but does have phlegm at times Anoro,  Had 2 Moderna Covax Some DOE with greater than casual activity. Mild cough with scant phlegm.  She asks "how long before I die", but denies acute issues.  CXR 11/04/2018- IMPRESSION: Cardiomegaly.  No active disease.  11/02/20- 87 year old female former smoker followed for COPD, DOE, complicated by history lung nodule, GERD, A. Fib, HBP, TIA, Glaucoma/ blind R eye -Anoro Covid vax-3 Moderna           Husband here Has not used maintenance inhaler since November.  Feels well controlled.  Oxygen saturation always greater than 90% usually greater than 95%.  No recent infection.   ROS-see HPI   + = positive Constitutional:   No-   weight loss, night  sweats, fevers, chills, fatigue, lassitude. HEENT:   No-  headaches, difficulty swallowing, tooth/dental problems, sore throat,       No-  sneezing, itching, ear ache, nasal congestion, post nasal drip,  CV:  No-   chest pain, orthopnea, PND, swelling in lower extremities, anasarca,  dizziness, palpitations Resp: +  shortness of breath with exertion or at rest.              +productive cough,  +non-productive cough,  No- coughing up of blood.                change in color of mucus.   wheezing.   Skin: No-   rash or lesions. GI:  No-   heartburn, indigestion, abdominal pain, nausea, vomiting, GU: . MS:  No-   joint pain or swelling.   Neuro-     nothing unusual Psych:  No- change in mood or affect. No depression or anxiety.  + memory loss.  OBJ- Physical Exam General- +Very Alert, Oriented, Affect-appropriate, Distress- none acute, + thin Skin- rash-none, lesions- none, excoriation- none Lymphadenopathy- none Head- atraumatic            Eyes- Gross vision intact, PERRLA, conjunctivae and secretions clear            Ears- Hearing, canals-normal            Nose- Clear, no-Septal dev, mucus, polyps, erosion, perforation             Throat- Mallampati II , mucosa clear , drainage- none, tonsils- atrophic Neck- flexible , trachea midline, no stridor , thyroid nl, carotid no bruit Chest - symmetrical excursion , unlabored           Heart/CV- +IRR/ Afib, no murmur , no  gallop  , no rub, nl s1 s2                           - JVD- none , edema- none, stasis changes- none, varices- none           Lung- clear to P&A,  cough-none , dullness-none, rub- none, wheeze- none           Chest wall-  Abd-  Br/ Gen/ Rectal- Not done, not indicated Extrem- cyanosis- none, clubbing, none, atrophy- none, strength- nl Neuro- grossly intact to observation

## 2020-11-03 ENCOUNTER — Encounter: Payer: Self-pay | Admitting: Internal Medicine

## 2020-11-03 ENCOUNTER — Other Ambulatory Visit: Payer: Self-pay

## 2020-11-03 ENCOUNTER — Ambulatory Visit: Payer: Medicare Other | Admitting: Internal Medicine

## 2020-11-03 DIAGNOSIS — I4811 Longstanding persistent atrial fibrillation: Secondary | ICD-10-CM | POA: Diagnosis not present

## 2020-11-03 DIAGNOSIS — J449 Chronic obstructive pulmonary disease, unspecified: Secondary | ICD-10-CM | POA: Diagnosis not present

## 2020-11-03 NOTE — Patient Instructions (Signed)
Ok to stay off inhalers for now. We can restart Anoro or Trelegy (not both) if chest congestion, wheeze and cough flare up.  Please call if we can help

## 2021-02-22 ENCOUNTER — Other Ambulatory Visit: Payer: Self-pay | Admitting: *Deleted

## 2021-02-22 DIAGNOSIS — I4811 Longstanding persistent atrial fibrillation: Secondary | ICD-10-CM

## 2021-02-22 MED ORDER — RIVAROXABAN 15 MG PO TABS: ORAL_TABLET | ORAL | 5 refills | Status: DC

## 2021-02-22 NOTE — Telephone Encounter (Signed)
Xarelto 15mg  paper refill request received. Pt is 87 years old, weight-55.2kg, Crea-0.73 on 01/27/2020, last seen by Dr. Acie Fredrickson on 05/10/2020, Diagnosis-Afib, CrCl-46.56ml/min; Dose is appropriate based on dosing criteria. Will send in refill to requested pharmacy.

## 2021-03-19 ENCOUNTER — Encounter: Payer: Self-pay | Admitting: Internal Medicine

## 2021-03-19 NOTE — Assessment & Plan Note (Signed)
Exam is consistent with atrial fibrillation with controlled rate.  Followed by cardiology.

## 2021-03-19 NOTE — Assessment & Plan Note (Signed)
Feels very well controlled without maintenance inhalers and with rare use of rescue inhaler. Plan-CXR

## 2021-04-18 ENCOUNTER — Other Ambulatory Visit: Payer: Self-pay

## 2021-04-18 ENCOUNTER — Ambulatory Visit (INDEPENDENT_AMBULATORY_CARE_PROVIDER_SITE_OTHER): Payer: Medicare Other

## 2021-04-18 ENCOUNTER — Ambulatory Visit: Payer: Medicare Other | Admitting: Adult Health

## 2021-04-18 ENCOUNTER — Encounter: Payer: Self-pay | Admitting: Adult Health

## 2021-04-18 VITALS — BP 140/74 | HR 68 | Temp 97.9°F | Ht 68.0 in | Wt 123.4 lb

## 2021-04-18 DIAGNOSIS — J449 Chronic obstructive pulmonary disease, unspecified: Secondary | ICD-10-CM

## 2021-04-18 DIAGNOSIS — R0902 Hypoxemia: Secondary | ICD-10-CM | POA: Diagnosis not present

## 2021-04-18 MED ORDER — PREDNISONE 20 MG PO TABS: 20.0000 mg | ORAL_TABLET | Freq: Every day | ORAL | 0 refills | Status: DC

## 2021-04-18 MED ORDER — ALBUTEROL SULFATE HFA 108 (90 BASE) MCG/ACT IN AERS: 1.0000 | INHALATION_SPRAY | Freq: Four times a day (QID) | RESPIRATORY_TRACT | 2 refills | Status: DC | PRN

## 2021-04-18 MED ORDER — TRELEGY ELLIPTA 100-62.5-25 MCG/ACT IN AEPB: 1.0000 | INHALATION_SPRAY | Freq: Every day | RESPIRATORY_TRACT | 5 refills | Status: DC

## 2021-04-18 NOTE — Assessment & Plan Note (Addendum)
Exertional desaturation with associated COPD flare  Begin O2 2l/m with activity  Close follow up  . Plan  . Patient Instructions  Prednisone 20mg  daily for 5 days.  Chest xray today .  Begin TRELEGY  1 puff daily , rinse after use.  Albuterol inhaler As needed   Begin oxygen 2 L with activity  Follow up with Dr. Annamaria Boots  in 1 week and As needed   Please contact office for sooner follow up if symptoms do not improve or worsen or seek emergency care

## 2021-04-18 NOTE — Patient Instructions (Addendum)
Prednisone 20mg  daily for 5 days.  Chest xray today .  Begin TRELEGY  1 puff daily , rinse after use.  Albuterol inhaler As needed   Begin oxygen 2 L with activity  Follow up with Dr. Annamaria Boots  in 1 week and As needed   Please contact office for sooner follow up if symptoms do not improve or worsen or seek emergency care

## 2021-04-18 NOTE — Progress Notes (Signed)
@Patient  ID: Natalie Ho, female    DOB: [birth year], 88 y.o.   MRN: [id number]  Chief Complaint  Patient presents with   Acute Visit    Referring provider: Chesley Noon, MD  HPI: 88 year old female former smoker followed for COPD with emphysema Medical history significant for A-fib on Xarelto, Dementia   TEST/EVENTS :  PFTs December 2017 FEV1 57%, ratio 65, FVC 66%, positive bronchodilator response, mid flow reversibility, DLCO 38%.  04/18/2021 Acute OV : COPD  Patient presents for an acute office visit.  Patient is accompanied by her family members.  Complains over the last couple weeks they have noticed that her breathing has not been doing as well.  She has been more short of breath than usual with increased cough at nighttime.  Patient does have underlying moderate dementia.  She has no complaints says that she feels fine. She denies any fever, discolored mucus, orthopnea or edema.  Says recent labs at PCP were reported okay.  O2 sats in office 82-84% walking . At rest 90% on room air. Required 2l/m to keep sats >88-90% walking .    Allergies  Allergen Reactions   Benzonatate Other (See Comments) and Rash    Unknown reaction Unknown reaction Unknown reaction    Shrimp [Shellfish Allergy] Anaphylaxis and Swelling   Dabigatran Diarrhea and Nausea And Vomiting   Dabigatran Etexilate Mesylate Other (See Comments) and Rash    "sick to my stomach and lost weight" "sick to my stomach and lost weight" Upset stomach"Pradaxa" "sick to my stomach and lost weight" Upset stomach"Pradaxa"    Diclofenac Other (See Comments) and Rash    Either dizzines, GI upset Either dizzines, GI upset    Gabapentin Other (See Comments)   Alendronate Sodium Other (See Comments)    Eyes and head hurt   Dabigatran Etexilate Mesylate Other (See Comments)    Upset stomach"Pradaxa"   Diamox [Acetazolamide]     Excessive sleepiness   Levofloxacin Other (See Comments)    "made her funny in  the head" "made her funny in the head"   Sulfonamide Derivatives Other (See Comments)    Unknown reaction   Tramadol Other (See Comments) and Nausea And Vomiting    "made me feel funny in my head" "made me feel funny in my head"    Immunization History  Administered Date(s) Administered   Fluad Quad(high Dose 65+) 12/27/2016, 11/04/2018, 01/05/2020, 12/09/2020   Influenza Split 12/12/2010, 12/11/2012   Influenza Whole 12/29/2009, 01/22/2012   Influenza, High Dose Seasonal PF 12/27/2016, 01/14/2018   Influenza,inj,Quad PF,6+ Mos 02/12/2014, 11/12/2014, 12/12/2015   Moderna Sars-Covid-2 Vaccination 04/14/2019, 05/11/2019   PPD Test 06/12/2016   Pneumococcal Conjugate-13 11/12/2014   Pneumococcal Polysaccharide-23 03/13/2006   Pneumococcal-Unspecified 03/13/2006   Tdap 11/07/2011   Unspecified SARS-COV-2 Vaccination 03/28/2019, 04/23/2019   Zoster, Live 04/09/2014    Past Medical History:  Diagnosis Date   Anxiety    Aortic insufficiency    Arthritis    Atrial fibrillation (HCC)    Chronic anticoagulation    Colitis    GERD (gastroesophageal reflux disease)    Glaucoma    both eyes   Gout    Hyperlipidemia    Hypertension    Melanoma of nose ([place])    "was only a precancer area"-no problems since   SOB (shortness of breath)    Stroke ([place])     2007 and  TIA-3'08, none recent   TIA (transient ischemic attack)    Tinnitus of both ears  Tobacco History: Social History   Tobacco Use  Smoking Status Former   Packs/day: 0.50   Years: 25.00   Pack years: 12.50   Types: Cigarettes   Quit date: 03/13/1990   Years since quitting: 31.1  Smokeless Tobacco Never   Counseling given: Not Answered   Outpatient Medications Prior to Visit  Medication Sig Dispense Refill   atorvastatin (LIPITOR) 20 MG tablet Take 1 tablet (20 mg total) by mouth at bedtime. 90 tablet 3   digoxin (LANOXIN) 0.25 MG tablet TAKE 1/2 TABLET EVERY DAY 15 tablet 11   donepezil (ARICEPT) 10 MG  tablet Take 10 mg by mouth daily.     escitalopram (LEXAPRO) 10 MG tablet Take 10 mg by mouth daily.      ibandronate (BONIVA) 150 MG tablet Take 150 mg by mouth every 30 (thirty) days.     latanoprost (XALATAN) 0.005 % ophthalmic solution Place 1-2 drops into the left eye daily.      megestrol (MEGACE) 40 MG tablet Take by mouth.     nebivolol (BYSTOLIC) 2.5 MG tablet Take 0.5 tablets (1.25 mg total) by mouth daily. 30 tablet 2   prednisoLONE acetate (PRED FORTE) 1 % ophthalmic suspension Place 1 drop into the right eye 3 (three) times daily.     Rivaroxaban (XARELTO) 15 MG TABS tablet TAKE 1 TABLET(15 MG) BY MOUTH DAILY WITH SUPPER 30 tablet 5   SIMBRINZA 1-0.2 % SUSP Place 1 drop into the right eye 3 (three) times daily.   0   SYSTANE ULTRA 0.4-0.3 % SOLN Place 1 drop into both eyes as needed.     Fluticasone-Umeclidin-Vilant (TRELEGY ELLIPTA) 100-62.5-25 MCG/INH AEPB Inhale into the lungs. (Patient not taking: Reported on 11/03/2020)     No facility-administered medications prior to visit.     Review of Systems:   Constitutional:   No  weight loss, night sweats,  Fevers, chills, fatigue, or  lassitude.  HEENT:   No headaches,  Difficulty swallowing,  Tooth/dental problems, or  Sore throat,                No sneezing, itching, ear ache, nasal congestion, post nasal drip,   CV:  No chest pain,  Orthopnea, PND, swelling in lower extremities, anasarca, dizziness, palpitations, syncope.   GI  No heartburn, indigestion, abdominal pain, nausea, vomiting, diarrhea, change in bowel habits, loss of appetite, bloody stools.   Resp:   No chest wall deformity  Skin: no rash or lesions.  GU: no dysuria, change in color of urine, no urgency or frequency.  No flank pain, no hematuria   MS:  No joint pain or swelling.  No decreased range of motion.  No back pain.    Physical Exam  BP 140/74 (BP Location: Left Arm, Patient Position: Sitting, Cuff Size: Normal)    Pulse 68    Temp 97.9 F  (36.6 C) (Oral)    Ht 5\' 8"  (1.727 m)    Wt 123 lb 6.4 oz (56 kg)    SpO2 94%    BMI 18.76 kg/m   GEN: A/Ox3; pleasant , NAD, well nourished    HEENT:  [place]/AT,   NOSE-clear, THROAT-clear, no lesions, no postnasal drip or exudate noted.   NECK:  Supple w/ fair ROM; no JVD; normal carotid impulses w/o bruits; no thyromegaly or nodules palpated; no lymphadenopathy.    RESP  + dry cough , +exp wheezing , speaks in full sentences , no accessory muscle use, no dullness to percussion  CARD:  RRR, no m/r/g, no peripheral edema, pulses intact, no cyanosis or clubbing.  GI:   Soft & nt; nml bowel sounds; no organomegaly or masses detected.   Musco: Warm bil, no deformities or joint swelling noted.   Neuro: alert, no focal deficits noted.    Skin: Warm, no lesions or rashes    Lab Results:    BMET   BNP   Imaging: No results found.    PFT Results Latest Ref Rng & Units 02/23/2016  FVC-Pre L 1.48  FVC-Predicted Pre % 52  FVC-Post L 1.86  FVC-Predicted Post % 66  Pre FEV1/FVC % % 71  Post FEV1/FCV % % 65  FEV1-Pre L 1.06  FEV1-Predicted Pre % 50  FEV1-Post L 1.22  DLCO uncorrected ml/min/mmHg 5.97  DLCO UNC% % 21  DLCO corrected ml/min/mmHg 10.95  DLCO COR %Predicted % 38  DLVA Predicted % 91  TLC L 7.29  TLC % Predicted % 132  RV % Predicted % 219    No results found for: NITRICOXIDE      Assessment & Plan:   COPD mixed type (HCC) Exacerbation - check chest xray  Short steroid burst  Restart TRELEGY  Close follow up  Now with exertional hypoxia- will begin O2 with act   Plan  Patient Instructions  Prednisone 20mg  daily for 5 days.  Chest xray today .  Begin TRELEGY  1 puff daily , rinse after use.  Albuterol inhaler As needed   Begin oxygen 2 L with activity.  Due to start on oxygen Follow up with Dr. Annamaria Boots  in 1 week and As needed   Please contact office for sooner follow up if symptoms do not improve or worsen or seek emergency care          Hypoxia Exertional desaturation with associated COPD flare  Begin O2 2l/m with activity  Close follow up  . Plan  . Patient Instructions  Prednisone 20mg  daily for 5 days.  Chest xray today .  Begin TRELEGY  1 puff daily , rinse after use.  Albuterol inhaler As needed   Begin oxygen 2 L with activity.  Due to start on oxygen Follow up with Dr. Annamaria Boots  in 1 week and As needed   Please contact office for sooner follow up if symptoms do not improve or worsen or seek emergency care         I spent   41 minutes dedicated to the care of this patient on the date of this encounter to include pre-visit review of records, face-to-face time with the patient discussing conditions above, post visit ordering of testing, clinical documentation with the electronic health record, making appropriate referrals as documented, and communicating necessary findings to members of the patients care team.    Rexene Edison, NP 04/18/2021

## 2021-04-18 NOTE — Assessment & Plan Note (Addendum)
Exacerbation - check chest xray  Short steroid burst  Restart TRELEGY  Close follow up  Now with exertional hypoxia- will begin O2 with act   Plan  Patient Instructions  Prednisone 20mg  daily for 5 days.  Chest xray today .  Begin TRELEGY  1 puff daily , rinse after use.  Albuterol inhaler As needed   Begin oxygen 2 L with activity  Follow up with Dr. Annamaria Boots  in 1 week and As needed   Please contact office for sooner follow up if symptoms do not improve or worsen or seek emergency care

## 2021-04-21 ENCOUNTER — Other Ambulatory Visit: Payer: Self-pay

## 2021-04-21 MED ORDER — NEBIVOLOL HCL 2.5 MG PO TABS: 1.2500 mg | ORAL_TABLET | Freq: Every day | ORAL | 0 refills | Status: DC

## 2021-04-28 ENCOUNTER — Ambulatory Visit: Payer: Medicare Other | Admitting: Adult Health

## 2021-04-28 ENCOUNTER — Other Ambulatory Visit: Payer: Self-pay

## 2021-04-28 ENCOUNTER — Encounter: Payer: Self-pay | Admitting: Adult Health

## 2021-04-28 DIAGNOSIS — J9611 Chronic respiratory failure with hypoxia: Secondary | ICD-10-CM | POA: Diagnosis not present

## 2021-04-28 DIAGNOSIS — J449 Chronic obstructive pulmonary disease, unspecified: Secondary | ICD-10-CM

## 2021-04-28 NOTE — Progress Notes (Signed)
@Patient  ID: Natalie Ho, female    DOB: 08/01/1930, 88 y.o.   MRN: [id number]  Chief Complaint  Patient presents with   Follow-up    Referring provider: Chesley Noon, MD  HPI: 88 year old female former smoker followed for COPD with emphysema Medical history significant for A-fib on as Xarelto on dementia  TEST/EVENTS :  PFTs December 2017 FEV1 57%, ratio 65, FVC 66%, positive bronchodilator response, mid flow reversibility, DLCO 38%  04/28/2021 Follow up ; COPD  Patient returns for a 1 week follow-up.  Patient was seen last visit for a COPD exacerbation.  She was given a short prednisone taper and  restarted on Trelegy.  And started on oxygen 2 L with activity. Chest x-ray showed bronchitic changes.  Since last visit patient says she is feeling much better. Cough and congestion have decreased.  Also oxygen levels have improved substantially.  Oxygen levels at home are running 93% to 96%.  She has had no significant desaturations.  Patient is accompanied by her daughter and husband.  They want to not start on oxygen if possible. Patient also says that Trelegy is expensive and is going to look into his formulary to see if they can find something cheaper.  He does not qualify for patient assistance.    Allergies  Allergen Reactions   Benzonatate Other (See Comments) and Rash    Unknown reaction Unknown reaction Unknown reaction    Shrimp [Shellfish Allergy] Anaphylaxis and Swelling   Dabigatran Diarrhea and Nausea And Vomiting   Dabigatran Etexilate Mesylate Other (See Comments) and Rash    "sick to my stomach and lost weight" "sick to my stomach and lost weight" Upset stomach"Pradaxa" "sick to my stomach and lost weight" Upset stomach"Pradaxa"    Diclofenac Other (See Comments) and Rash    Either dizzines, GI upset Either dizzines, GI upset    Gabapentin Other (See Comments)   Alendronate Sodium Other (See Comments)    Eyes and head hurt   Dabigatran Etexilate  Mesylate Other (See Comments)    Upset stomach"Pradaxa"   Diamox [Acetazolamide]     Excessive sleepiness   Levofloxacin Other (See Comments)    "made her funny in the head" "made her funny in the head"   Sulfonamide Derivatives Other (See Comments)    Unknown reaction   Tramadol Other (See Comments) and Nausea And Vomiting    "made me feel funny in my head" "made me feel funny in my head"    Immunization History  Administered Date(s) Administered   Fluad Quad(high Dose 65+) 12/27/2016, 11/04/2018, 01/05/2020, 12/09/2020   Influenza Split 12/12/2010, 12/11/2012   Influenza Whole 12/29/2009, 01/22/2012   Influenza, High Dose Seasonal PF 12/27/2016, 01/14/2018   Influenza,inj,Quad PF,6+ Mos 02/12/2014, 11/12/2014, 12/12/2015   Moderna Sars-Covid-2 Vaccination 04/14/2019, 05/11/2019   PPD Test 06/12/2016   Pneumococcal Conjugate-13 11/12/2014   Pneumococcal Polysaccharide-23 03/13/2006   Pneumococcal-Unspecified 03/13/2006   Tdap 11/07/2011   Unspecified SARS-COV-2 Vaccination 03/28/2019, 04/23/2019   Zoster, Live 04/09/2014    Past Medical History:  Diagnosis Date   Anxiety    Aortic insufficiency    Arthritis    Atrial fibrillation (HCC)    Chronic anticoagulation    Colitis    GERD (gastroesophageal reflux disease)    Glaucoma    both eyes   Gout    Hyperlipidemia    Hypertension    Melanoma of nose ([place])    "was only a precancer area"-no problems since   SOB (shortness of breath)  Stroke [facility])     2007 and  TIA-3'08, none recent   TIA (transient ischemic attack)    Tinnitus of both ears     Tobacco History: Social History   Tobacco Use  Smoking Status Former   Packs/day: 0.50   Years: 25.00   Pack years: 12.50   Types: Cigarettes   Quit date: 03/13/1990   Years since quitting: 31.1  Smokeless Tobacco Never   Counseling given: Not Answered   Outpatient Medications Prior to Visit  Medication Sig Dispense Refill   albuterol (VENTOLIN HFA) 108 (90  Base) MCG/ACT inhaler Inhale 1-2 puffs into the lungs every 6 (six) hours as needed. 8 g 2   atorvastatin (LIPITOR) 20 MG tablet Take 1 tablet (20 mg total) by mouth at bedtime. 90 tablet 3   digoxin (LANOXIN) 0.25 MG tablet TAKE 1/2 TABLET EVERY DAY 15 tablet 11   donepezil (ARICEPT) 10 MG tablet Take 10 mg by mouth daily.     escitalopram (LEXAPRO) 10 MG tablet Take 10 mg by mouth daily.      Fluticasone-Umeclidin-Vilant (TRELEGY ELLIPTA) 100-62.5-25 MCG/ACT AEPB Inhale 1 puff into the lungs daily. 1 each 5   ibandronate (BONIVA) 150 MG tablet Take 150 mg by mouth every 30 (thirty) days.     latanoprost (XALATAN) 0.005 % ophthalmic solution Place 1-2 drops into the left eye daily.      megestrol (MEGACE) 40 MG tablet Take by mouth.     nebivolol (BYSTOLIC) 2.5 MG tablet Take 0.5 tablets (1.25 mg total) by mouth daily. Please make overdue appt with Dr. Acie Fredrickson before anymore refills. Thank you 1st attempt 15 tablet 0   prednisoLONE acetate (PRED FORTE) 1 % ophthalmic suspension Place 1 drop into the right eye 3 (three) times daily.     predniSONE (DELTASONE) 20 MG tablet Take 1 tablet (20 mg total) by mouth daily with breakfast. 5 tablet 0   Rivaroxaban (XARELTO) 15 MG TABS tablet TAKE 1 TABLET(15 MG) BY MOUTH DAILY WITH SUPPER 30 tablet 5   SIMBRINZA 1-0.2 % SUSP Place 1 drop into the right eye 3 (three) times daily.   0   SYSTANE ULTRA 0.4-0.3 % SOLN Place 1 drop into both eyes as needed.     No facility-administered medications prior to visit.     Review of Systems:   Constitutional:   No  weight loss, night sweats,  Fevers, chills,  +fatigue, or  lassitude.  HEENT:   No headaches,  Difficulty swallowing,  Tooth/dental problems, or  Sore throat,                No sneezing, itching, ear ache, nasal congestion, post nasal drip,   CV:  No chest pain,  Orthopnea, PND, swelling in lower extremities, anasarca, dizziness, palpitations, syncope.   GI  No heartburn, indigestion, abdominal  pain, nausea, vomiting, diarrhea, change in bowel habits, loss of appetite, bloody stools.   Resp: No excess mucus, no productive cough,  No non-productive cough,  No coughing up of blood.  No change in color of mucus.  No wheezing.  No chest wall deformity  Skin: no rash or lesions.  GU: no dysuria, change in color of urine, no urgency or frequency.  No flank pain, no hematuria   MS:  No joint pain or swelling.  No decreased range of motion.  No back pain.    Physical Exam  BP (!) 160/80 (BP Location: Left Arm, Patient Position: Sitting, Cuff Size: Normal)    Pulse  79    Temp 97.8 F (36.6 C) (Oral)    Ht 5\' 9"  (1.753 m)    Wt 125 lb 3.2 oz (56.8 kg)    SpO2 93%    BMI 18.49 kg/m   GEN: A/Ox3; pleasant , NAD, well nourished    HEENT:  [place]/AT,  EACs-clear, TMs-wnl, NOSE-clear, THROAT-clear, no lesions, no postnasal drip or exudate noted.   NECK:  Supple w/ fair ROM; no JVD; normal carotid impulses w/o bruits; no thyromegaly or nodules palpated; no lymphadenopathy.    RESP  Clear  P & A; w/o, wheezes/ rales/ or rhonchi. no accessory muscle use, no dullness to percussion  CARD:  RRR, no m/r/g, no peripheral edema, pulses intact, no cyanosis or clubbing.  GI:   Soft & nt; nml bowel sounds; no organomegaly or masses detected.   Musco: Warm bil, no deformities or joint swelling noted.   Neuro: alert, no focal deficits noted.    Skin: Warm, no lesions or rashes    Lab Results:    BMET    ProBNP   Imaging: DG Chest 2 View  Result Date: 04/18/2021 CLINICAL DATA:  COPD exacerbation EXAM: CHEST - 2 VIEW COMPARISON:  11/04/2018 FINDINGS: Frontal and lateral views of the chest demonstrates stable enlargement of the cardiac silhouette. No acute airspace disease, effusion, or pneumothorax. Mild increased bronchovascular prominence since prior study which could reflect reactive airway disease or viral pneumonitis. No acute bony abnormalities. IMPRESSION: 1. Increased bilateral  bronchovascular prominence, favor bronchitis or reactive airway disease. No lobar pneumonia. Electronically Signed   By: Randa Ngo M.D.   On: 04/18/2021 16:58      PFT Results Latest Ref Rng & Units 02/23/2016  FVC-Pre L 1.48  FVC-Predicted Pre % 52  FVC-Post L 1.86  FVC-Predicted Post % 66  Pre FEV1/FVC % % 71  Post FEV1/FCV % % 65  FEV1-Pre L 1.06  FEV1-Predicted Pre % 50  FEV1-Post L 1.22  DLCO uncorrected ml/min/mmHg 5.97  DLCO UNC% % 21  DLCO corrected ml/min/mmHg 10.95  DLCO COR %Predicted % 38  DLVA Predicted % 91  TLC L 7.29  TLC % Predicted % 132  RV % Predicted % 219    No results found for: NITRICOXIDE      Assessment & Plan:   COPD mixed type (HCC) Recent COPD exacerbation now improving  Plan  Patient Instructions  Continue on TRELEGY  1 puff daily , rinse after use.  Albuterol inhaler As needed   May remain off oxygen , goal is to keep O2 sats >88-90%. Call if oxygen level is dropping below 88% Follow up with Dr. Annamaria Boots  in 3 months  and As needed   Please contact office for sooner follow up if symptoms do not improve or worsen or seek emergency care         Chronic respiratory failure with hypoxia ([place]) Recent COPD exacerbation with exertional hypoxemia now improved with decreased oxygen demands.  O2 saturations maintaining in the 90s on room air.  We will hold off on oxygen at home.  Of advised patient and family if O2 saturations are dropping into 88 or below to call our office for reevaluation  Plan  Patient Instructions  Continue on TRELEGY  1 puff daily , rinse after use.  Albuterol inhaler As needed   May remain off oxygen , goal is to keep O2 sats >88-90%. Call if oxygen level is dropping below 88% Follow up with Dr. Annamaria Boots  in 3 months  and As needed   Please contact office for sooner follow up if symptoms do not improve or worsen or seek emergency care           Rexene Edison, NP 04/28/2021

## 2021-04-28 NOTE — Assessment & Plan Note (Signed)
Recent COPD exacerbation with exertional hypoxemia now improved with decreased oxygen demands.  O2 saturations maintaining in the 90s on room air.  We will hold off on oxygen at home.  Of advised patient and family if O2 saturations are dropping into 88 or below to call our office for reevaluation  Plan  Patient Instructions  Continue on TRELEGY  1 puff daily , rinse after use.  Albuterol inhaler As needed   May remain off oxygen , goal is to keep O2 sats >88-90%. Call if oxygen level is dropping below 88% Follow up with Dr. Annamaria Boots  in 3 months  and As needed   Please contact office for sooner follow up if symptoms do not improve or worsen or seek emergency care

## 2021-04-28 NOTE — Patient Instructions (Signed)
Continue on TRELEGY  1 puff daily , rinse after use.  Albuterol inhaler As needed   May remain off oxygen , goal is to keep O2 sats >88-90%. Call if oxygen level is dropping below 88% Follow up with Dr. Annamaria Boots  in 3 months  and As needed   Please contact office for sooner follow up if symptoms do not improve or worsen or seek emergency care

## 2021-04-28 NOTE — Assessment & Plan Note (Signed)
Recent COPD exacerbation now improving  Plan  Patient Instructions  Continue on TRELEGY  1 puff daily , rinse after use.  Albuterol inhaler As needed   May remain off oxygen , goal is to keep O2 sats >88-90%. Call if oxygen level is dropping below 88% Follow up with Dr. Annamaria Boots  in 3 months  and As needed   Please contact office for sooner follow up if symptoms do not improve or worsen or seek emergency care

## 2021-05-17 ENCOUNTER — Other Ambulatory Visit: Payer: Self-pay | Admitting: Cardiovascular Disease

## 2021-05-18 ENCOUNTER — Other Ambulatory Visit: Payer: Self-pay

## 2021-05-18 MED ORDER — NEBIVOLOL HCL 2.5 MG PO TABS: 1.2500 mg | ORAL_TABLET | Freq: Every day | ORAL | 0 refills | Status: DC

## 2021-06-01 ENCOUNTER — Telehealth: Payer: Self-pay | Admitting: Internal Medicine

## 2021-06-01 DIAGNOSIS — J9611 Chronic respiratory failure with hypoxia: Secondary | ICD-10-CM

## 2021-06-01 NOTE — Telephone Encounter (Signed)
Called and spoke with patient's husband Thayer Jew to let him know that order has been placed for patient to receive oxygen and that DME should call them once they get order. He expressed understanding. Nothing further needed at this time.  ?

## 2021-06-01 NOTE — Telephone Encounter (Signed)
Received a call from patient's PCP at [facility] who states that patient came in for visit today and is there now and her oxygen is 88% on room air. Nurse stated that patient's daughter and husband stated that they turned in her oxygen because the patient would not wear it. They put her on 2 liters and she recovered to 94%. They are wondering if we can put in an order for patient to get oxygen again.  ? ?Dr. Annamaria Boots please advise ?

## 2021-06-01 NOTE — Telephone Encounter (Signed)
Yes- order DME, home O2 2L/ minute. Need record from PCP office documenting qualifying O2 sat on room air at rest. Dx Chronic respiratory failure with hypoxia ?Note she had home O2 before but turned it in when she refused to wear it. She will need family persistence with this.  ?

## 2021-06-02 ENCOUNTER — Telehealth: Payer: Self-pay | Admitting: Cardiovascular Disease

## 2021-06-02 ENCOUNTER — Telehealth: Payer: Self-pay | Admitting: Internal Medicine

## 2021-06-02 NOTE — Telephone Encounter (Signed)
Pt c/o medication issue: ? ?1. Name of Medication: Lasix ? ?2. How are you currently taking this medication (dosage and times per day)? Not currently taking  ? ?3. Are you having a reaction (difficulty breathing--STAT)? No  ? ?4. What is your medication issue? Caryl Pina is calling from Dr. Teena Dunk office stating they are wanting to start the patient on a low dose of Lasix due to slightly elevated BMP, SOB, and lower extremity edema. States she is faxing over information regarding this.  ?

## 2021-06-02 NOTE — Telephone Encounter (Signed)
Nothing needed send  ?Comm/msg  ?

## 2021-06-03 NOTE — Telephone Encounter (Signed)
ATC phone rang several times then switched over to an extended hold at [facility]. Hold time >10 mins. Will attempt to contact [place] [facility] at another time.  ?

## 2021-06-07 ENCOUNTER — Telehealth: Payer: Self-pay | Admitting: Cardiovascular Disease

## 2021-06-07 DIAGNOSIS — I509 Heart failure, unspecified: Secondary | ICD-10-CM

## 2021-06-07 DIAGNOSIS — I482 Chronic atrial fibrillation, unspecified: Secondary | ICD-10-CM

## 2021-06-07 DIAGNOSIS — I10 Essential (primary) hypertension: Secondary | ICD-10-CM

## 2021-06-07 DIAGNOSIS — I272 Pulmonary hypertension, unspecified: Secondary | ICD-10-CM

## 2021-06-07 MED ORDER — POTASSIUM CHLORIDE CRYS ER 20 MEQ PO TBCR: EXTENDED_RELEASE_TABLET | ORAL | 3 refills | Status: AC

## 2021-06-07 MED ORDER — FUROSEMIDE 40 MG PO TABS: ORAL_TABLET | ORAL | 3 refills | Status: AC

## 2021-06-07 NOTE — Telephone Encounter (Signed)
Asking that dr Cathie Olden looked at patient notes and labs to give this recommendation on what they should do. Sherri Billed sent message. Fax [phone]. Please advise ?

## 2021-06-07 NOTE — Telephone Encounter (Signed)
Spoke with Natalie Ho from [facility], PA-C's office regarding last office visit 06/01/21 where patient had swelling to BLE, weight gain of 10lbs, BNP 2,042 (06/01/21). ? ?Creatinine 0.68, BUN/Creat Ratio 24, BUN 16, Potassium  4.2 on 06/01/21. ? ?She wanted to discuss with Dr. Acie Fredrickson before starting patient on furosemide. Dr. Acie Fredrickson out of office until next week. ? ?Discussed with DOD Dr. Nira Conn Pemberton--recommends patient start furosemide '40mg'$  BID x3 days then once daily, and potassium chloride 20mq BID x3 days then once daily. Also recommends an echocardiogram for heart failure and BMET next week. ? ?Called and spoke with patient's spouse, Natalie Ho who verbalized understanding of the above. ? ?Lasix and Potassium sent to pharmacy of choice. ? ?Patient has an appointment with Dr. NAcie Fredricksonon 06/15/21. ?

## 2021-06-14 ENCOUNTER — Ambulatory Visit: Payer: Medicare Other | Admitting: Cardiovascular Disease

## 2021-06-15 ENCOUNTER — Ambulatory Visit: Payer: Medicare Other | Admitting: Cardiovascular Disease

## 2021-06-15 ENCOUNTER — Encounter: Payer: Self-pay | Admitting: Cardiovascular Disease

## 2021-06-15 VITALS — BP 130/70 | HR 58 | Ht 69.0 in | Wt 126.2 lb

## 2021-06-15 DIAGNOSIS — I482 Chronic atrial fibrillation, unspecified: Secondary | ICD-10-CM | POA: Diagnosis not present

## 2021-06-15 DIAGNOSIS — I509 Heart failure, unspecified: Secondary | ICD-10-CM

## 2021-06-15 NOTE — Progress Notes (Signed)
? ? ? ?Natalie Ho ? ?Date of Birth  Dec 23, 1930 ?[facility] ?[address] ?[place], [place]  [address] ?[phone]  Fax  [phone] ? ?Problems: ?1. Atrial fibrillation ?2. Remote stroke ?3. Aortic insufficiency ?4. Hypertension ?5. Hypercholesterolemia ?6. Dementia  ? ? ? ?88 yo Hx of a-Fib, remote stroke, HTN, hypercholesterolemia.  Her last visit we added amlodipine 2.5 mg a day. She also started taking her Benicar 20 mg twice a day instead of 40 mg at once. She is feeling quite a bit better. Her blood pressure readings have stabilized. She still occasionally has some signs of mild hypertension with a systolic blood pressure 195.      She also has episodes where her blood pressure will become fairly low especially at night. They're given some nights when she has held her medication for several hours because her blood pressure was in the 90 range. ? ?She's not been limited by chest pain or shortness of breath. ? ?She has been having some problems with hypertension.  We added amlodipine 2.5 mg at her last visit.  Her blood pressures have been better.   ? ?Jul 11, 2012: ? ?She has done well after her left hip replacement.   She has some dyspnea but is overall doing well.   She has stopped her Benicar and is feeling much better.  She was having low BP readings.  ? ?September 07, 2014  ? ?Natalie Ho is doing well .  No CP .  Mild dyspnea with exercise. ? ?Jul 28, 2015: ? ?Has some DOE with mowing the grass and vacuuming . ?Echo in 2014 shows normal LV function. ?She did have moderate pulmonary HTN ( PA pressure of 46)  ?BP has been ok at home. ?A bit elevated here.  ?Has a chronic cough  ?Records from Dr. Melford Aase were reviewed.   Labs look great.  ? ?Nov. 30, 2017: ? ?Natalie Ho is seen today for follow up of her atrial fib and pulmonary HTN. ?Has shortness of breath with exertion .  ?The lasix has not helped.  ?Has seen a pulmonologist . ? ?August 16, 2016:   ? ?Natalie Ho has had some low BP readings,  Has had  presyncope ?Has shingles in her right eye  ?Was started on Anoro ( inhaler) and is breathing much better.  ? ?Dec. 5, 2018: ?Natalie Ho is feeling well. ?Is losing the vision in her right eye.  ? is on Anora ( new inhaler ) has mild pulmonary HTN by echo  ?Has greatly reduced her salt intake  ? ?September 28, 2017: ? ?K seen today for follow-up of her chronic atrial fibrillation.  She is also having some eye problems and is on Diamox.  She has a history of mild pulmonary hypertension.  Blood pressure remains well controlled. ?Is blind in her right eye.  ?WBC is low - going to hem-Onc doctor today  ? ?Mar 27, 2018 ? ?Natalie Ho  is seen today for her chronic AFib .   She also has moderate pulmonary HTN - seems to be stable  ?Thinks her HR has increased recently  ? ?Total chol = 134 ?HDL is 63 ?Trigs = 50  ? ?She is on ASA 81 mg as well as Xarelto ?We will DC ASA today  ? ?Oct. 15, 2020 ?Natalie Ho is seen today for follow up of her atrial fib and moderate pulmonary HTN ?Seems to be stable.   Has some DOE if she over does it . Able  to mow grass and do housework .  ? ?May 13, 2019: ?Natalie Ho is seen today for a follow-up visit of her atrial fibrillation, moderate pulmonary hypertension and systemic hypertension.  Her blood pressure is actually low today. ?Able to do all of her normal activities.  ?No cp, no changes in her dyspnea.  ?  She never has any dizziness  ? ?Aug. 30, 2021: ?Natalie Ho is seen today for follow up of her atrial fib, pulmonary HTN, systemic HTN. ?Is still have significant DOE  ?Has seen pulmonary - did not have much more to offer  ?We discussed having her see the [facility]  ? ?Feb. 28, 2022: ? ?Natalie Ho is seen today for follow up of her HTN, pulmonry HTN, ?Has seen Dr. Haroldine Laws. ?He thinks her pulmonary HTN is largely due to her COPD and restrictive lung disease ?ReDs clip 22%. ?They discussed RHC  ? ?June 15, 2021 ?Natalie Ho is seen today for follow up of her HTN,  pulmonary HTN ?COPD, restrictive lung disease ?She has developed  progressive dementia  ?Had gained some weight in Feb. March . ?Had swollen ankles.  ?Saw Dr. Melford Aase ?Pro BNP is 2042 ?Was started on Lasix and Kdur  ?Weight has come down, leg edema persists  ?Echo is scheduled for tomorrow  ?Avoids salt,  Natalie Ho is working very hard to avoid salty foods.  ?Uses meal services ( has been through 3 different meal services )  ?Eating healthy choice prepared meals from the grocery store  ? ?Gave info on the lounge doctor leg rest.  ? ? ?Current Outpatient Medications on File Prior to Visit  ?Medication Sig Dispense Refill  ? albuterol (VENTOLIN HFA) 108 (90 Base) MCG/ACT inhaler Inhale 1-2 puffs into the lungs every 6 (six) hours as needed. 8 g 2  ? atorvastatin (LIPITOR) 20 MG tablet Take 1 tablet (20 mg total) by mouth at bedtime. 90 tablet 3  ? digoxin (LANOXIN) 0.25 MG tablet TAKE 1/2 TABLET EVERY DAY 15 tablet 11  ? donepezil (ARICEPT) 10 MG tablet Take 10 mg by mouth daily.    ? escitalopram (LEXAPRO) 10 MG tablet Take 10 mg by mouth daily.     ? Fluticasone-Umeclidin-Vilant (TRELEGY ELLIPTA) 100-62.5-25 MCG/ACT AEPB Inhale 1 puff into the lungs daily. 1 each 5  ? furosemide (LASIX) 40 MG tablet Take 1 tablet ('40mg'$ ) by mouth twice daily for 3 days, then take once daily. 90 tablet 3  ? ibandronate (BONIVA) 150 MG tablet Take 150 mg by mouth every 30 (thirty) days.    ? latanoprost (XALATAN) 0.005 % ophthalmic solution Place 1-2 drops into the left eye daily.     ? megestrol (MEGACE) 40 MG tablet Take by mouth.    ? nebivolol (BYSTOLIC) 2.5 MG tablet Take 0.5 tablets (1.25 mg total) by mouth daily. Please keep upcoming appt with Dr. Acie Fredrickson in April 2023 before anymore refills. Thank you 15 tablet 0  ? potassium chloride SA (KLOR-CON M) 20 MEQ tablet Take 1 tablet (40mq) by mouth twice daily for 3 days, then take once daily. 90 tablet 3  ? Rivaroxaban (XARELTO) 15 MG TABS tablet TAKE 1 TABLET(15 MG) BY MOUTH DAILY WITH SUPPER 30 tablet 5  ? prednisoLONE acetate (PRED FORTE) 1 %  ophthalmic suspension Place 1 drop into the right eye 3 (three) times daily. (Patient not taking: Reported on 06/15/2021)    ? predniSONE (DELTASONE) 20 MG tablet Take 1 tablet (20 mg total) by mouth daily with breakfast. (Patient not taking: Reported on  06/15/2021) 5 tablet 0  ? SIMBRINZA 1-0.2 % SUSP Place 1 drop into the right eye 3 (three) times daily.  (Patient not taking: Reported on 06/15/2021)  0  ? SYSTANE ULTRA 0.4-0.3 % SOLN Place 1 drop into both eyes as needed. (Patient not taking: Reported on 06/15/2021)    ? ?No current facility-administered medications on file prior to visit.  ? ? ?Allergies  ?Allergen Reactions  ? Benzonatate Other (See Comments) and Rash  ?  Unknown reaction ?Unknown reaction ?Unknown reaction ?  ? Shrimp [Shellfish Allergy] Anaphylaxis and Swelling  ? Dabigatran Diarrhea and Nausea And Vomiting  ? Dabigatran Etexilate Mesylate Other (See Comments) and Rash  ?  "sick to my stomach and lost weight" ?"sick to my stomach and lost weight" ?Upset stomach"Pradaxa" ?"sick to my stomach and lost weight" ?Upset stomach"Pradaxa" ?  ? Diclofenac Other (See Comments) and Rash  ?  Either dizzines, GI upset ?Either dizzines, GI upset ?  ? Gabapentin Other (See Comments)  ? Lorazepam Rash  ? Alendronate Sodium Other (See Comments)  ?  Eyes and head hurt  ? Dabigatran Etexilate Mesylate Other (See Comments)  ?  Upset stomach"Pradaxa"  ? Diamox [Acetazolamide]   ?  Excessive sleepiness  ? Levofloxacin Other (See Comments)  ?  "made her funny in the head" ?"made her funny in the head"  ? Sulfonamide Derivatives Other (See Comments)  ?  Unknown reaction  ? Tramadol Other (See Comments) and Nausea And Vomiting  ?  "made me feel funny in my head" ?"made me feel funny in my head"  ? ? ?Past Medical History:  ?Diagnosis Date  ? Anxiety   ? Aortic insufficiency   ? Arthritis   ? Atrial fibrillation ([place])   ? Chronic anticoagulation   ? Colitis   ? GERD (gastroesophageal reflux disease)   ? Glaucoma   ? both eyes   ? Gout   ? Hyperlipidemia   ? Hypertension   ? Melanoma of nose ([place])   ? "was only a precancer area"-no problems since  ? SOB (shortness of breath)   ? Stroke [facility])   ?  2007 and  TIA-3'08, none recent  ? TIA (

## 2021-06-15 NOTE — Patient Instructions (Signed)
Medication Instructions:  ?Your physician has recommended you make the following change in your medication:  ? ?1) STOP Bystolic ? ?*If you need a refill on your cardiac medications before your next appointment, please call your pharmacy* ? ?Lab Work: ?TODAY: BMET ?If you have labs (blood work) drawn today and your tests are completely normal, you will receive your results only by: ?MyChart Message (if you have MyChart) OR ?A paper copy in the mail ?If you have any lab test that is abnormal or we need to change your treatment, we will call you to review the results. ? ?Testing/Procedures: ?NONE ? ?Follow-Up: ?At [facility], you and your health needs are our priority.  As part of our continuing mission to provide you with exceptional heart care, we have created designated Provider Care Teams.  These Care Teams include your primary Cardiologist (physician) and Advanced Practice Providers (APPs -  Physician Assistants and Nurse Practitioners) who all work together to provide you with the care you need, when you need it. ? ?We recommend signing up for the patient portal called "MyChart".  Sign up information is provided on this After Visit Summary.  MyChart is used to connect with patients for Virtual Visits (Telemedicine).  Patients are able to view lab/test results, encounter notes, upcoming appointments, etc.  Non-urgent messages can be sent to your provider as well.   ?To learn more about what you can do with MyChart, go to [email or web address].   ? ?Your next appointment:   ?6 month(s) ? ?The format for your next appointment:   ?In Person ? ?Provider:   ?Christen Bame, NP    ? ?Other Instructions ?For your  leg edema you  should do  the following ?1. Leg elevation - I recommend the Lounge Dr. Leg rest.  See below for details  ?2. Salt restriction  -  Use potassium chloride instead of regular salt as a salt substitute. ?3. Walk regularly ?4. Compression hose - Medical Supply store  ?  ? ? ?Available on  [email or web address] ?Or  ?Go to [email or web address] ? ? ? ? ?

## 2021-06-16 ENCOUNTER — Ambulatory Visit (HOSPITAL_COMMUNITY): Payer: Medicare Other | Attending: Cardiovascular Disease

## 2021-06-16 DIAGNOSIS — I272 Pulmonary hypertension, unspecified: Secondary | ICD-10-CM | POA: Insufficient documentation

## 2021-06-16 DIAGNOSIS — I509 Heart failure, unspecified: Secondary | ICD-10-CM | POA: Insufficient documentation

## 2021-06-16 LAB — BASIC METABOLIC PANEL
BUN/Creatinine Ratio: 24 (ref 12–28)
BUN: 20 mg/dL (ref 8–27)
CO2: 29 mmol/L (ref 20–29)
Calcium: 9.4 mg/dL (ref 8.7–10.3)
Chloride: 97 mmol/L (ref 96–106)
Creatinine, Ser: 0.82 mg/dL (ref 0.57–1.00)
Glucose: 89 mg/dL (ref 70–99)
Potassium: 4.1 mmol/L (ref 3.5–5.2)
Sodium: 143 mmol/L (ref 134–144)
eGFR: 68 mL/min/{1.73_m2} (ref >59)

## 2021-06-18 LAB — ECHOCARDIOGRAM COMPLETE
P 1/2 time: 543 msec
S' Lateral: 2.3 cm

## 2021-06-21 ENCOUNTER — Telehealth: Payer: Self-pay | Admitting: Cardiovascular Disease

## 2021-06-21 NOTE — Telephone Encounter (Signed)
? ?  Pt's daughter calling to get echo result ?

## 2021-06-21 NOTE — Telephone Encounter (Signed)
Echo results reviewed with patient's daughter. (DPR) ?

## 2021-07-27 NOTE — Progress Notes (Signed)
HPI female former smoker followed for COPD, complicated by history lung nodule, GERD, A. fib, HBP, TIA, glaucoma   PFT: 04/20/2011-mild obstructive airways disease with insignificant response to bronchodilator, air trapping, diffusion mildly reduced. FEV1/FVC 0.63, DLCO 76%. 6 minute walk test-97%, 96%, 98%, 513 m. Well-maintained oxygenation. CT chest 04/20/12 IMPRESSION:  1. No evidence of acute pulmonary embolism.  2. Small pleural effusions, cardiomegaly, and slightly prominent  interstitial markings may indicate very mild interstitial edema.  3. Calcified mediastinal and hilar nodes with calcified right  upper lobe granuloma consistent with prior granulomatous disease.  Original Report Authenticated By: Ivar Drape, M.D PFT 02/23/2016- Severe obstructive airways disease minimal response to bronchodilator, air trapping, overinflation, diffusion reduced. Emphysema pattern. FVC 1.86/66%, FEV1 1.22/57%, ratio 0.65, RV 219%, TLC 132%, DLCO 21%  -----------------------------------------------------------   11/02/20- 88 year old female former smoker followed for COPD, DOE, complicated by history lung nodule, GERD, A. Fib, HBP, TIA, Glaucoma/ blind R eye -Anoro Covid vax-3 Moderna           Husband here Has not used maintenance inhaler since November.  Feels well controlled.  Oxygen saturation always greater than 90% usually greater than 95%.  No recent infection.  07/28/21-  88 year old female former smoker followed for COPD, DOE, complicated by history lung nodule, GERD, A. Fib, CHF,  HBP, TIA, Glaucoma/ blind R eye, Dementia,  O2 2L / [employer]   requalified June 08, 2021 at PCP office with O2 sat 88%. -Trelegy 100> Anoro, Ventolin hfa,  Covid vax-3 Moderna                        Dtr and husband here ECHO- moderately reduced RV function, moderate-severe PHTN -----Patient's husband would like to talk about her oxygen levels and her home oxygen. Arrival O2 sat 91% room air. They report her O2  sat at PCP office was 88%, qualifying for O2. Walk Test 07/28/21- dropped to minimum 88%/ HR 90. On O2 2L minimum O2 sat 96%/ HR 98. Qulifies for portable O2. Dementia interfering with self-care. CXR 04/18/21- IMPRESSION: 1. Increased bilateral bronchovascular prominence, favor bronchitis or reactive airway disease. No lobar pneumonia.  ROS-see HPI   + = positive Constitutional:   No-   weight loss, night sweats, fevers, chills, fatigue, lassitude. HEENT:   No-  headaches, difficulty swallowing, tooth/dental problems, sore throat,       No-  sneezing, itching, ear ache, nasal congestion, post nasal drip,  CV:  No-   chest pain, orthopnea, PND, swelling in lower extremities, anasarca,  dizziness, palpitations Resp: +  shortness of breath with exertion or at rest.              +productive cough,  +non-productive cough,  No- coughing up of blood.                change in color of mucus.   wheezing.   Skin: No-   rash or lesions. GI:  No-   heartburn, indigestion, abdominal pain, nausea, vomiting, GU: . MS:  No-   joint pain or swelling.   Neuro-     nothing unusual Psych:  No- change in mood or affect. No depression or anxiety.  + memory loss.  OBJ- Physical Exam General- +Very Alert, Oriented, Affect-appropriate, Distress- none acute, + thin Skin- rash-none, lesions- none, excoriation- none Lymphadenopathy- none Head- atraumatic            Eyes- Gross vision intact, PERRLA, conjunctivae and secretions clear  Ears- Hearing, canals-normal            Nose- Clear, no-Septal dev, mucus, polyps, erosion, perforation             Throat- Mallampati II , mucosa clear , drainage- none, tonsils- atrophic Neck- flexible , trachea midline, no stridor , thyroid nl, carotid no bruit Chest - symmetrical excursion , unlabored           Heart/CV- +few extra beats/ Afib, no murmur , no gallop  , no rub, nl s1 s2                           - JVD- none , edema- none, stasis changes- none, varices-  none           Lung- clear to P&A,  cough+minimumal dry cough , dullness-none, rub- none, wheeze- none           Chest wall-  Abd-  Br/ Gen/ Rectal- Not done, not indicated Extrem- cyanosis- none, clubbing, none, atrophy- none, strength- nl Neuro- grossly intact to observation

## 2021-07-28 ENCOUNTER — Encounter: Payer: Self-pay | Admitting: Internal Medicine

## 2021-07-28 ENCOUNTER — Ambulatory Visit: Payer: Medicare Other | Admitting: Internal Medicine

## 2021-07-28 DIAGNOSIS — J449 Chronic obstructive pulmonary disease, unspecified: Secondary | ICD-10-CM

## 2021-07-28 DIAGNOSIS — I4811 Longstanding persistent atrial fibrillation: Secondary | ICD-10-CM

## 2021-07-28 DIAGNOSIS — J9611 Chronic respiratory failure with hypoxia: Secondary | ICD-10-CM | POA: Diagnosis not present

## 2021-07-28 NOTE — Patient Instructions (Addendum)
Order- O2 qualifying walk test    dx COPD mixed type  As discussed- use oxygen at rest or with exertion to kee O2 saturation 88% or better most of thee time. You can ue your own judgment.  Try using the Anoro inhaler   inhale 1 puff, once daily. Try it for a week and see if it helps cough and shortness of breath enough to continue.  Please call if we can help

## 2021-08-22 ENCOUNTER — Other Ambulatory Visit: Payer: Self-pay | Admitting: Cardiovascular Disease

## 2021-08-22 DIAGNOSIS — I4811 Longstanding persistent atrial fibrillation: Secondary | ICD-10-CM

## 2021-08-22 NOTE — Telephone Encounter (Signed)
Prescription refill request for Xarelto received.  Indication:  Atrial Fib Last office visit: 06/15/21 P Nahser MD Weight: 57.2kg Age: 88 Scr: 0.82 on 06/15/21 CrCl: 42  Based on above findings Xarelto '15mg'$  daily (renal) is the appropriate dose.  Refill approved.

## 2021-08-29 ENCOUNTER — Encounter: Payer: Self-pay | Admitting: Internal Medicine

## 2021-08-29 NOTE — Assessment & Plan Note (Signed)
Now qualified for rest and portable O2 2L. Discussed with family.

## 2021-08-29 NOTE — Assessment & Plan Note (Addendum)
She had had Trelegy. Unclear benefit. Discussed cost. Has glaucoma She has an Anoro inhaler at home and we discussed trying that for comparison.

## 2021-08-29 NOTE — Assessment & Plan Note (Signed)
Rate controlled. Managed by cardiology.

## 2021-11-24 ENCOUNTER — Other Ambulatory Visit (HOSPITAL_COMMUNITY): Payer: Self-pay | Admitting: Internal Medicine

## 2022-01-28 NOTE — Progress Notes (Unsigned)
HPI female former smoker followed for COPD, complicated by history lung nodule, GERD, A. fib, HBP, TIA, glaucoma   PFT: 04/20/2011-mild obstructive airways disease with insignificant response to bronchodilator, air trapping, diffusion mildly reduced. FEV1/FVC 0.63, DLCO 76%. 6 minute walk test-97%, 96%, 98%, 513 m. Well-maintained oxygenation. CT chest 04/20/12 IMPRESSION:  1. No evidence of acute pulmonary embolism.  2. Small pleural effusions, cardiomegaly, and slightly prominent  interstitial markings may indicate very mild interstitial edema.  3. Calcified mediastinal and hilar nodes with calcified right  upper lobe granuloma consistent with prior granulomatous disease.  Original Report Authenticated By: Ivar Drape, M.D PFT 02/23/2016- Severe obstructive airways disease minimal response to bronchodilator, air trapping, overinflation, diffusion reduced. Emphysema pattern. FVC 1.86/66%, FEV1 1.22/57%, ratio 0.65, RV 219%, TLC 132%, DLCO 21%  -----------------------------------------------------------   07/28/21-  88 year old female former smoker followed for COPD, DOE, complicated by history lung nodule, GERD, A. Fib, CHF,  HBP, TIA, Glaucoma/ blind R eye, Dementia,  O2 2L / [employer]   requalified June 08, 2021 at PCP office with O2 sat 88%. -Trelegy 100> Anoro, Ventolin hfa,  Covid vax-3 Moderna                        Dtr and husband here ECHO- moderately reduced RV function, moderate-severe PHTN -----Patient's husband would like to talk about her oxygen levels and her home oxygen. Arrival O2 sat 91% room air. They report her O2 sat at PCP office was 88%, qualifying for O2. Walk Test 07/28/21- dropped to minimum 88%/ HR 90. On O2 2L minimum O2 sat 96%/ HR 98. Qulifies for portable O2. Dementia interfering with self-care. CXR 04/18/21- IMPRESSION: 1. Increased bilateral bronchovascular prominence, favor bronchitis or reactive airway disease. No lobar pneumonia.  01/30/22- 88 year old  female former smoker followed for COPD, DOE, complicated by history lung nodule, GERD, A. Fib, CHF,  HBP, TIA, Glaucoma/ blind R eye, Dementia,  O2 2L / [employer]   requalified June 08, 2021 at PCP office with O2 sat 88%. -Trelegy 100> Anoro, Ventolin hfa,  Covid vax-3 Moderna        ROS-see HPI   + = positive Constitutional:   No-   weight loss, night sweats, fevers, chills, fatigue, lassitude. HEENT:   No-  headaches, difficulty swallowing, tooth/dental problems, sore throat,       No-  sneezing, itching, ear ache, nasal congestion, post nasal drip,  CV:  No-   chest pain, orthopnea, PND, swelling in lower extremities, anasarca,  dizziness, palpitations Resp: +  shortness of breath with exertion or at rest.              +productive cough,  +non-productive cough,  No- coughing up of blood.                change in color of mucus.   wheezing.   Skin: No-   rash or lesions. GI:  No-   heartburn, indigestion, abdominal pain, nausea, vomiting, GU: . MS:  No-   joint pain or swelling.   Neuro-     nothing unusual Psych:  No- change in mood or affect. No depression or anxiety.  + memory loss.  OBJ- Physical Exam General- +Very Alert, Oriented, Affect-appropriate, Distress- none acute, + thin Skin- rash-none, lesions- none, excoriation- none Lymphadenopathy- none Head- atraumatic            Eyes- Gross vision intact, PERRLA, conjunctivae and secretions clear  Ears- Hearing, canals-normal            Nose- Clear, no-Septal dev, mucus, polyps, erosion, perforation             Throat- Mallampati II , mucosa clear , drainage- none, tonsils- atrophic Neck- flexible , trachea midline, no stridor , thyroid nl, carotid no bruit Chest - symmetrical excursion , unlabored           Heart/CV- +few extra beats/ Afib, no murmur , no gallop  , no rub, nl s1 s2                           - JVD- none , edema- none, stasis changes- none, varices- none           Lung- clear to P&A,  cough+minimumal  dry cough , dullness-none, rub- none, wheeze- none           Chest wall-  Abd-  Br/ Gen/ Rectal- Not done, not indicated Extrem- cyanosis- none, clubbing, none, atrophy- none, strength- nl Neuro- grossly intact to observation

## 2022-01-30 ENCOUNTER — Ambulatory Visit: Payer: Medicare Other | Admitting: Internal Medicine

## 2022-01-30 ENCOUNTER — Encounter: Payer: Self-pay | Admitting: Internal Medicine

## 2022-01-30 VITALS — BP 132/64 | HR 72 | Ht 68.0 in | Wt 115.8 lb

## 2022-01-30 DIAGNOSIS — J449 Chronic obstructive pulmonary disease, unspecified: Secondary | ICD-10-CM | POA: Diagnosis not present

## 2022-01-30 DIAGNOSIS — J9611 Chronic respiratory failure with hypoxia: Secondary | ICD-10-CM | POA: Diagnosis not present

## 2022-01-30 NOTE — Patient Instructions (Signed)
Be quick to start Anoro again- inhale 1 puff once daily- if this cough doesn't clear up.  Please call if we can help

## 2022-01-30 NOTE — Assessment & Plan Note (Signed)
Husband decided she was doing ok not using O2 and they turned it back in. Plan- reassess as needed

## 2022-01-30 NOTE — Assessment & Plan Note (Signed)
Recent dry cough may reflect husband's decision not to use Anoro now. Plan- Resume Anoro if cough persists

## 2022-02-12 NOTE — Progress Notes (Unsigned)
Cardiology Office Note:    Date:  02/13/2022   ID:  Natalie Ho, DOB 10-18-1930, MRN [id number]  PCP:  Chesley Noon, MD   [facility] Providers Cardiologist:  Mertie Moores, MD     Referring MD: Chesley Noon, MD   Chief Complaint: 6 month follow-up a fib  History of Present Illness:    Natalie Ho is a pleasant 88 y.o. female with a hx of atrial fibrillation, HTN, remote stroke, hyperlipidemia, moderate pulmonary hypertension, dementia, and aortic insufficiency.   Has been followed by Dr. Acie Fredrickson for many years. Found to have pulmonary hypertension in 2017, seen by pulmonology. Referred to AHF clinic and seen by Dr. Haroldine Laws 01/2020. He felt pulm HTN secondary to COPD and restrictive lung disease. Discussed RHC but this was not completed.  Last cardiology clinic visit was 06/15/2021 with Dr. Acie Fredrickson at which time she had bilateral leg edema.  She had decrease in weight on Lasix but leg edema persisted.  Bystolic was stopped 2/2 bradycardia.  Was encouraged to elevate her legs and recommended to return in 6 months for follow-up.  2D echo 06/18/2021 revealed normal LV systolic function.  RV function moderately reduced with moderate to severe pulmonary hypertension. RV function decreased in comparison to previous echoes, recommendation to continue medical therapy per Dr. Acie Fredrickson.  Today, she is here with her husband. Her dementia has worsened. She is able to respond to commands but is not alert to place, time.  Husband continues to care for her at home.  She, nor her husband, have any specific cardiac concerns today. She denies chest pain, shortness of breath, fatigue, palpitations, melena, hematuria, hemoptysis, diaphoresis, weakness, presyncope, syncope, orthopnea, and PND. Mild chronic LE edema.   Past Medical History:  Diagnosis Date   Anxiety    Aortic insufficiency    Arthritis    Atrial fibrillation (HCC)    Chronic anticoagulation    Colitis    GERD  (gastroesophageal reflux disease)    Glaucoma    both eyes   Gout    Hyperlipidemia    Hypertension    Melanoma of nose ([place])    "was only a precancer area"-no problems since   SOB (shortness of breath)    Stroke ([place])     2007 and  TIA-3'08, none recent   TIA (transient ischemic attack)    Tinnitus of both ears     Past Surgical History:  Procedure Laterality Date   BLEPHAROPLASTY     bilateral   CARDIOVERSION     Unsuccessful   cataracts     bilateral   CHOLECYSTECTOMY N/A 02/26/2013   Procedure: LAPAROSCOPIC CHOLECYSTECTOMY WITH INTRAOPERATIVE CHOLANGIOGRAM;  Surgeon: Edward Jolly, MD;  Location: [facility];  Service: General;  Laterality: N/A;   colon polyps     removed via colonoscopy   eye surgeries     Multiple eye surgeries for torned retina-bilateral   FRACTURE SURGERY     ORIF-Lt. hip -,removed hardware   TONSILLECTOMY     TOTAL HIP ARTHROPLASTY  04/03/2012   Procedure: TOTAL HIP ARTHROPLASTY ANTERIOR APPROACH;  Surgeon: Gearlean Alf, MD;  Location: [facility];  Service: Orthopedics;  Laterality: Left;    Current Medications: Current Meds  Medication Sig   atorvastatin (LIPITOR) 20 MG tablet Take 1 tablet (20 mg total) by mouth at bedtime.   digoxin (LANOXIN) 0.25 MG tablet TAKE 1/2 TABLET EVERY DAY   donepezil (ARICEPT) 10 MG tablet Take 10 mg by mouth daily.  escitalopram (LEXAPRO) 10 MG tablet Take 20 mg by mouth daily.   furosemide (LASIX) 40 MG tablet Take 1 tablet ('40mg'$ ) by mouth twice daily for 3 days, then take once daily.   ibandronate (BONIVA) 150 MG tablet Take 150 mg by mouth every 30 (thirty) days.   latanoprost (XALATAN) 0.005 % ophthalmic solution Place 1-2 drops into the left eye daily.    potassium chloride SA (KLOR-CON M) 20 MEQ tablet Take 1 tablet (57mq) by mouth twice daily for 3 days, then take once daily.   umeclidinium-vilanterol (ANORO ELLIPTA) 62.5-25 MCG/ACT AEPB Inhale 1 puff into the lungs daily.   XARELTO 15 MG TABS tablet  TAKE 1 TABLET(15 MG) BY MOUTH DAILY WITH SUPPER (Patient taking differently: Take 20 mg by mouth daily with supper.)     Allergies:   Benzonatate, Shrimp [shellfish allergy], Dabigatran, Dabigatran etexilate mesylate, Diclofenac, Gabapentin, Lorazepam, Alendronate sodium, Dabigatran etexilate mesylate, Diamox [acetazolamide], Levofloxacin, Sulfonamide derivatives, and Tramadol   Social History   Socioeconomic History   Marital status: Married    Spouse name: Not on file   Number of children: Not on file   Years of education: Not on file   Highest education level: Not on file  Occupational History   Not on file  Tobacco Use   Smoking status: Former    Packs/day: 0.50    Years: 25.00    Total pack years: 12.50    Types: Cigarettes    Quit date: 03/13/1990    Years since quitting: 31.9   Smokeless tobacco: Never  Vaping Use   Vaping Use: Never used  Substance and Sexual Activity   Alcohol use: Yes    Alcohol/week: 5.0 standard drinks of alcohol    Types: 5 drink(s) per week    Comment: wine daily   Drug use: No   Sexual activity: Not Currently  Other Topics Concern   Not on file  Social History Narrative   Not on file   Social Determinants of Health   Financial Resource Strain: Not on file  Food Insecurity: Not on file  Transportation Needs: Not on file  Physical Activity: Not on file  Stress: Not on file  Social Connections: Not on file     Family History: The patient's family history includes Heart disease in her mother.  ROS:   Please see the history of present illness.   All other systems reviewed and are negative.  Labs/Other Studies Reviewed:    The following studies were reviewed today:  Echo 06/18/21 1. Left ventricular ejection fraction, by estimation, is 60 to 65%. The  left ventricle has normal function. The left ventricle has no regional  wall motion abnormalities. Left ventricular diastolic parameters are  indeterminate.   2. Right ventricular  systolic function is moderately reduced. The right  ventricular size is moderately enlarged.   3. Left atrial size was moderately dilated.   4. Right atrial size was moderately dilated.   5. The mitral valve is abnormal. Trivial mitral valve regurgitation. No  evidence of mitral stenosis.   6. The aortic valve is tricuspid. There is moderate calcification of the  aortic valve. There is moderate thickening of the aortic valve. Aortic  valve regurgitation is mild to moderate. Aortic valve  sclerosis/calcification is present, without any  evidence of aortic stenosis.   7. Aortic dilatation noted. There is moderate dilatation of the ascending  aorta, measuring 40 mm.   8. The inferior vena cava is dilated in size with >50% respiratory  variability, suggesting right atrial pressure of 8 mmHg.  Echo 02/05/19 1. Left ventricular ejection fraction, by visual estimation, is 55 to  60%. The left ventricle has normal function. There is mildly increased  left ventricular hypertrophy.   2. Left ventricular diastolic function could not be evaluated.   3. The left ventricle has no regional wall motion abnormalities.   4. Global right ventricle has normal systolic function.The right  ventricular size is normal. No increase in right ventricular wall  thickness.   5. Left atrial size was severely dilated.   6. Right atrial size was moderately dilated.   7. The mitral valve is normal in structure. Trace mitral valve  regurgitation.   8. The tricuspid valve is normal in structure. Tricuspid valve  regurgitation is trivial.   9. The aortic valve is tricuspid. Aortic valve regurgitation is mild.  Mild aortic valve sclerosis without stenosis.  10. The pulmonic valve was normal in structure. Pulmonic valve  regurgitation is not visualized.  11. Normal pulmonary artery systolic pressure.  12. The inferior vena cava is normal in size with greater than 50%  respiratory variability, suggesting right atrial  pressure of 3 mmHg.    Recent Labs: 06/15/2021: BUN 20; Creatinine, Ser 0.82; Potassium 4.1; Sodium 143  Recent Lipid Panel    Component Value Date/Time   CHOL 126 12/26/2018 0916   TRIG 36 12/26/2018 0916   HDL 66 12/26/2018 0916   CHOLHDL 1.9 12/26/2018 0916   CHOLHDL 3 08/29/2010 1151   VLDL 22.0 08/29/2010 1151   LDLCALC 50 12/26/2018 0916     Risk Assessment/Calculations:    CHA2DS2-VASc Score = 7  This indicates a 11.2% annual risk of stroke. The patient's score is based upon: CHF History: 1 HTN History: 1 Diabetes History: 0 Stroke History: 2 Vascular Disease History: 0 Age Score: 2 Gender Score: 1    Physical Exam:    VS:  BP (!) 108/52   Pulse 77   Ht '5\' 8"'$  (1.727 m)   Wt 116 lb (52.6 kg)   SpO2 91%   BMI 17.64 kg/m     Wt Readings from Last 3 Encounters:  02/13/22 116 lb (52.6 kg)  01/30/22 115 lb 12.8 oz (52.5 kg)  07/28/21 120 lb 9.6 oz (54.7 kg)     GEN:  Well nourished, well developed in no acute distress HEENT: Normal NECK: No JVD; No carotid bruits CARDIAC: Irregular RR, Soft 2/6 murmur. No rubs, gallops RESPIRATORY:  Clear to auscultation without rales, wheezing or rhonchi  ABDOMEN: Soft, non-tender, non-distended MUSCULOSKELETAL:  No edema; No deformity. 2+ pedal pulses, equal bilaterally SKIN: Warm and dry NEUROLOGIC:  Alert, responds to commands PSYCHIATRIC: Confused   EKG:  EKG is not ordered today.     Diagnoses:    1. Longstanding persistent atrial fibrillation ([place])   2. Essential hypertension   3. Pulmonary HTN ([place])   4. COPD mixed type ([place])   5. Chronic anticoagulation   6. Nonrheumatic aortic valve insufficiency    Assessment and Plan:     Pulmonary hypertension: Echo 06/18/21 revealed RV function moderately reduced with moderate to severe pulmonary HTN with decrease in RV function since previous echo 01/2019.  Recommendation per Dr. Acie Fredrickson to continue medical therapy.  Denies dyspnea, orthopnea, edema. Seen by  pulmonology 01/30/2022 with C/O dry cough. Plan to restart Anoro inhaler if cough persists.   Aortic insufficiency: Mild to moderate aortic valve regurgitation on echo 06/18/2021.  Aortic valve calcification present without any evidence of  aortic stenosis. Soft murmur on exam. No chest pain, shortness of breath, presyncope, syncope. Continue to monitor clinically at this time.   Hypertension: BP is well-controlled, somewhat soft. She is asymptomatic. No medication changes today.  Persistent AF on chronic anticoagulation: HR is well-controlled. Husband reports she is taking Xarelto 20 mg, however appropriate dose based on creatine clearance is 15 mg daily.  I have asked her husband to check at home and report to [place] if new prescription is needed. No bleeding concerns. Continue digoxin, Xarelto.    Addendum: Patient's husband called back to verify Xarelto 15 mg daily is patient's current dose.  Disposition: 6 months with Dr. Acie Fredrickson or APP  Medication Adjustments/Labs and Tests Ordered: Current medicines are reviewed at length with the patient today.  Concerns regarding medicines are outlined above.  Orders Placed This Encounter  Procedures   Basic Metabolic Panel (BMET)   CBC   No orders of the defined types were placed in this encounter.   Patient Instructions  Medication Instructions:   Please ensure you are taking Xarelto one (1) tablet by mouth ( 15 mg) daily.   *If you need a refill on your cardiac medications before your next appointment, please call your pharmacy*   Lab Work:  TODAY!!!!! BMET/CBC  If you have labs (blood work) drawn today and your tests are completely normal, you will receive your results only by: [place] (if you have MyChart) OR A paper copy in the mail If you have any lab test that is abnormal or we need to change your treatment, we will call you to review the results.   Testing/Procedures:     Follow-Up: At [facility], you and  your health needs are our priority.  As part of our continuing mission to provide you with exceptional heart care, we have created designated Provider Care Teams.  These Care Teams include your primary Cardiologist (physician) and Advanced Practice Providers (APPs -  Physician Assistants and Nurse Practitioners) who all work together to provide you with the care you need, when you need it.  We recommend signing up for the patient portal called "MyChart".  Sign up information is provided on this After Visit Summary.  MyChart is used to connect with patients for Virtual Visits (Telemedicine).  Patients are able to view lab/test results, encounter notes, upcoming appointments, etc.  Non-urgent messages can be sent to your provider as well.   To learn more about what you can do with MyChart, go to [email or web address].    Your next appointment:   6 month(s)  The format for your next appointment:   In Person  Provider:   Mertie Moores, MD     Important Information About Sugar         Signed, Emmaline Life, NP  02/13/2022 3:35 PM    [place]

## 2022-02-13 ENCOUNTER — Encounter: Payer: Self-pay | Admitting: Nurse Practitioner

## 2022-02-13 ENCOUNTER — Ambulatory Visit: Payer: Medicare Other | Attending: Nurse Practitioner | Admitting: Nurse Practitioner

## 2022-02-13 ENCOUNTER — Telehealth: Payer: Self-pay | Admitting: Cardiovascular Disease

## 2022-02-13 VITALS — BP 108/52 | HR 77 | Ht 68.0 in | Wt 116.0 lb

## 2022-02-13 DIAGNOSIS — Z7901 Long term (current) use of anticoagulants: Secondary | ICD-10-CM

## 2022-02-13 DIAGNOSIS — I10 Essential (primary) hypertension: Secondary | ICD-10-CM

## 2022-02-13 DIAGNOSIS — J449 Chronic obstructive pulmonary disease, unspecified: Secondary | ICD-10-CM | POA: Diagnosis not present

## 2022-02-13 DIAGNOSIS — I272 Pulmonary hypertension, unspecified: Secondary | ICD-10-CM | POA: Diagnosis not present

## 2022-02-13 DIAGNOSIS — I351 Nonrheumatic aortic (valve) insufficiency: Secondary | ICD-10-CM

## 2022-02-13 DIAGNOSIS — I4811 Longstanding persistent atrial fibrillation: Secondary | ICD-10-CM | POA: Diagnosis not present

## 2022-02-13 MED ORDER — RIVAROXABAN 15 MG PO TABS: 15.0000 mg | ORAL_TABLET | Freq: Every day | ORAL | 5 refills | Status: AC

## 2022-02-13 NOTE — Telephone Encounter (Signed)
Husband calling to make NP aware that pt is currently taking   XARELTO 15 MG TABS tablet  Please advise

## 2022-02-13 NOTE — Addendum Note (Signed)
Addended by: Emmaline Life on: 02/13/2022 04:30 PM   Modules accepted: Orders

## 2022-02-13 NOTE — Patient Instructions (Signed)
Medication Instructions:   Please ensure you are taking Xarelto one (1) tablet by mouth ( 15 mg) daily.   *If you need a refill on your cardiac medications before your next appointment, please call your pharmacy*   Lab Work:  TODAY!!!!! BMET/CBC  If you have labs (blood work) drawn today and your tests are completely normal, you will receive your results only by: [place] (if you have MyChart) OR A paper copy in the mail If you have any lab test that is abnormal or we need to change your treatment, we will call you to review the results.   Testing/Procedures:     Follow-Up: At [facility], you and your health needs are our priority.  As part of our continuing mission to provide you with exceptional heart care, we have created designated Provider Care Teams.  These Care Teams include your primary Cardiologist (physician) and Advanced Practice Providers (APPs -  Physician Assistants and Nurse Practitioners) who all work together to provide you with the care you need, when you need it.  We recommend signing up for the patient portal called "MyChart".  Sign up information is provided on this After Visit Summary.  MyChart is used to connect with patients for Virtual Visits (Telemedicine).  Patients are able to view lab/test results, encounter notes, upcoming appointments, etc.  Non-urgent messages can be sent to your provider as well.   To learn more about what you can do with MyChart, go to [email or web address].    Your next appointment:   6 month(s)  The format for your next appointment:   In Person  Provider:   Mertie Moores, MD     Important Information About Sugar

## 2022-02-13 NOTE — Telephone Encounter (Signed)
This is appropriate dose for her creatinine clearance < 50.  Office visit note has been updated.

## 2022-02-14 LAB — CBC
Hematocrit: 45 % (ref 34.0–46.6)
Hemoglobin: 14.9 g/dL (ref 11.1–15.9)
MCH: 31.4 pg (ref 26.6–33.0)
MCHC: 33.1 g/dL (ref 31.5–35.7)
MCV: 95 fL (ref 79–97)
Platelets: 210 10*3/uL (ref 150–450)
RBC: 4.75 x10E6/uL (ref 3.77–5.28)
RDW: 12.4 % (ref 11.7–15.4)
WBC: 4.7 10*3/uL (ref 3.4–10.8)

## 2022-02-14 LAB — BASIC METABOLIC PANEL
BUN/Creatinine Ratio: 25 (ref 12–28)
BUN: 18 mg/dL (ref 8–27)
CO2: 31 mmol/L — ABNORMAL HIGH (ref 20–29)
Calcium: 9.2 mg/dL (ref 8.7–10.3)
Chloride: 102 mmol/L (ref 96–106)
Creatinine, Ser: 0.72 mg/dL (ref 0.57–1.00)
Glucose: 95 mg/dL (ref 70–99)
Potassium: 4.9 mmol/L (ref 3.5–5.2)
Sodium: 148 mmol/L — ABNORMAL HIGH (ref 134–144)
eGFR: 80 mL/min/{1.73_m2} (ref >59)

## 2022-04-27 ENCOUNTER — Telehealth: Payer: Self-pay | Admitting: Internal Medicine

## 2022-04-27 NOTE — Telephone Encounter (Signed)
Dr. Annamaria Boots just an [place]. Ms. Neeper has passed away

## 2022-05-01 ENCOUNTER — Telehealth: Payer: Self-pay | Admitting: Cardiovascular Disease

## 2022-05-01 NOTE — Telephone Encounter (Signed)
Patient's son called to discuss patient's cause of death. He said he would like to talk with Dr. Acie Fredrickson

## 2022-05-02 ENCOUNTER — Telehealth: Payer: Self-pay | Admitting: Internal Medicine

## 2022-05-02 NOTE — Telephone Encounter (Signed)
Son calling to discuss causes if death. Would like to speak to Dr. Annamaria Boots. His # [phone] His name is Richard.

## 2022-05-02 NOTE — Telephone Encounter (Signed)
MD called and spoke to son at length after clinic was completed 05/01/22. No further questions. Will close encounter.

## 2022-05-03 NOTE — Telephone Encounter (Signed)
Spoke with patients son advised we could not give out information on patient since she has patient he would have to call medical records. I provided him with that number. Nothing further needed.

## 2022-05-12 DEATH — deceased

## 2022-05-25 NOTE — Telephone Encounter (Signed)
.  h 

## 2022-08-01 ENCOUNTER — Ambulatory Visit: Payer: Medicare Other | Admitting: Internal Medicine

## 2022-08-30 ENCOUNTER — Ambulatory Visit: Payer: Medicare Other | Admitting: Cardiovascular Disease

## 2024-01-25 IMAGING — DX DG CHEST 2V
2 series · 2 of 2 positions shown · non-contrast
Comparison: 11/04/2018

CLINICAL DATA: COPD exacerbation

EXAM:
CHEST - 2 VIEW

[chest pa]
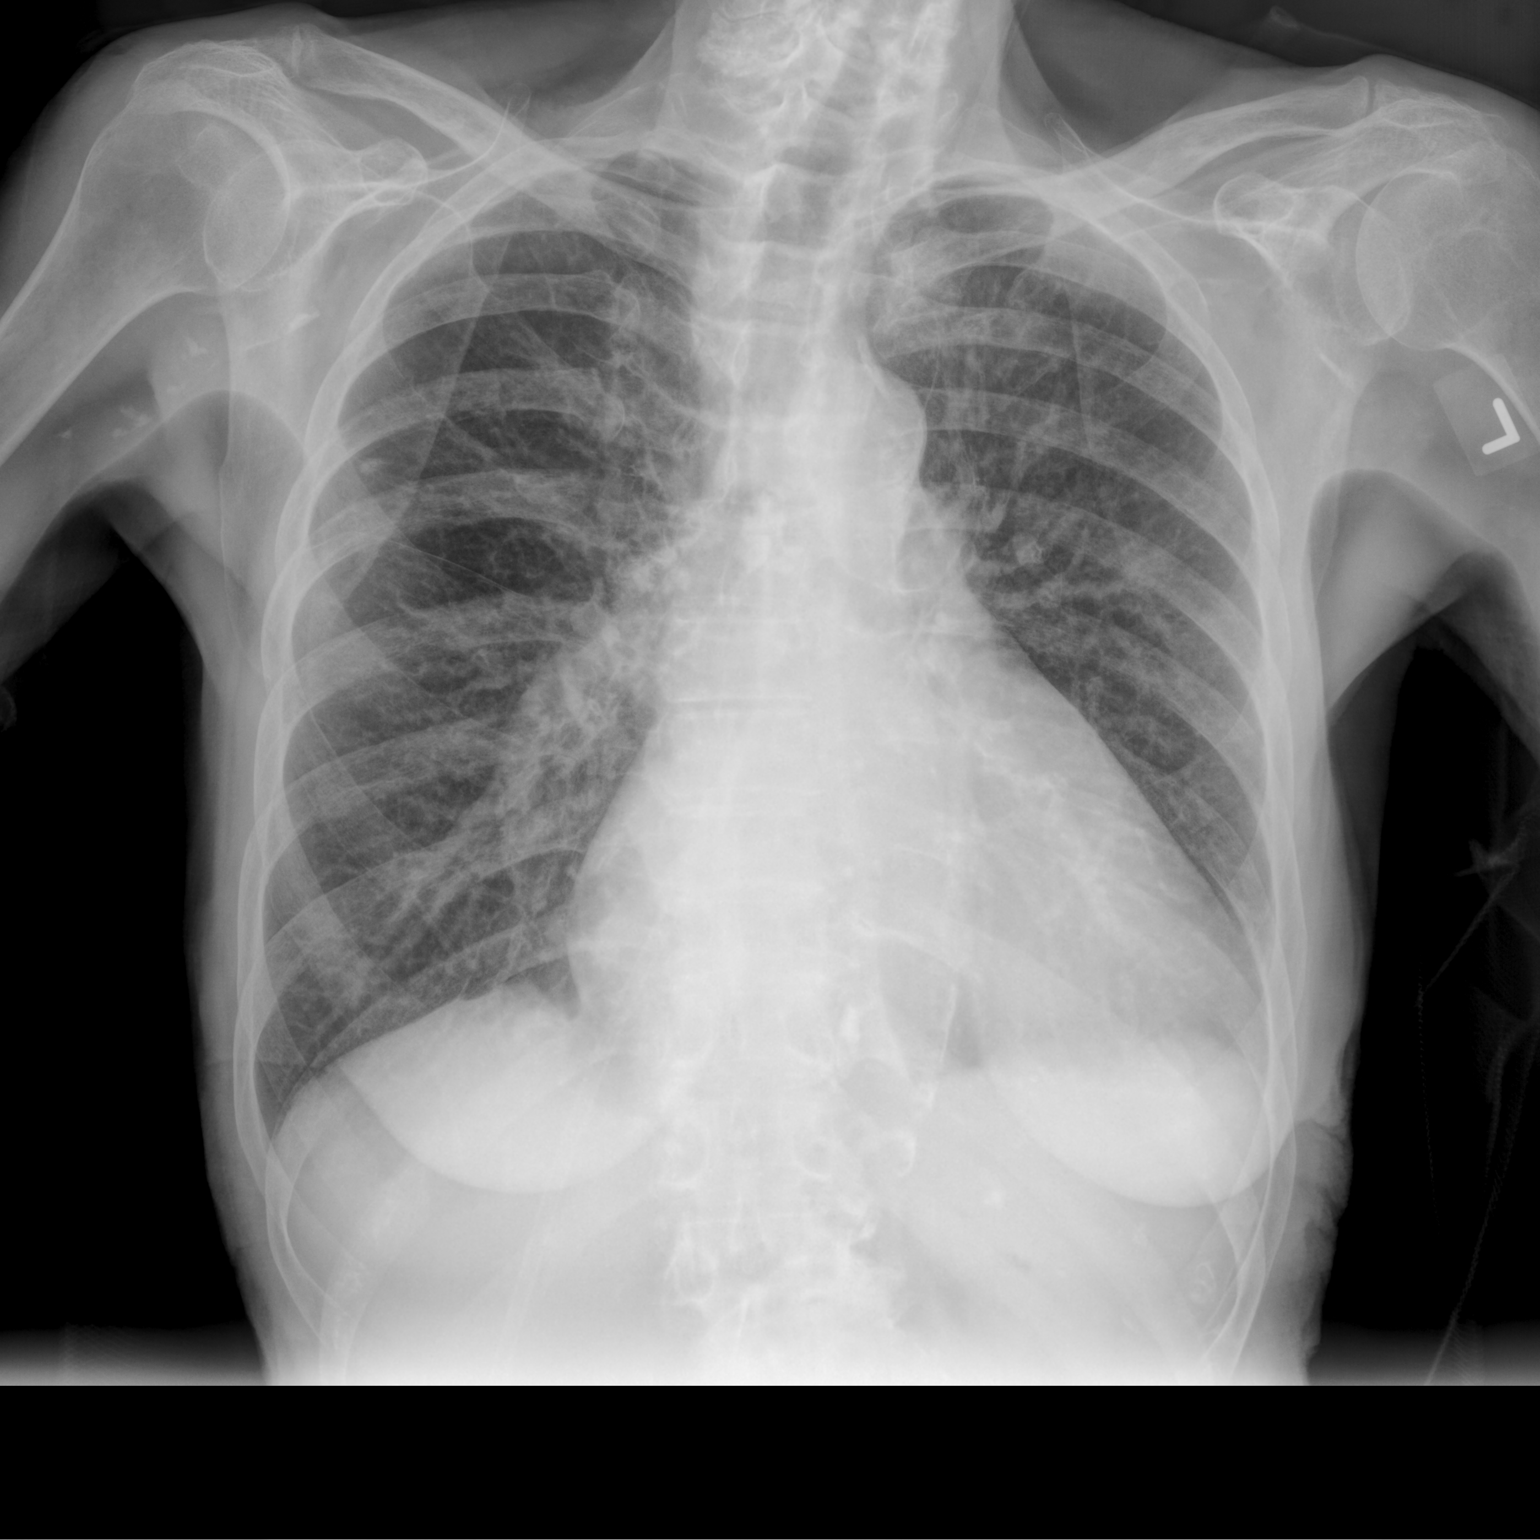

[chest lat]
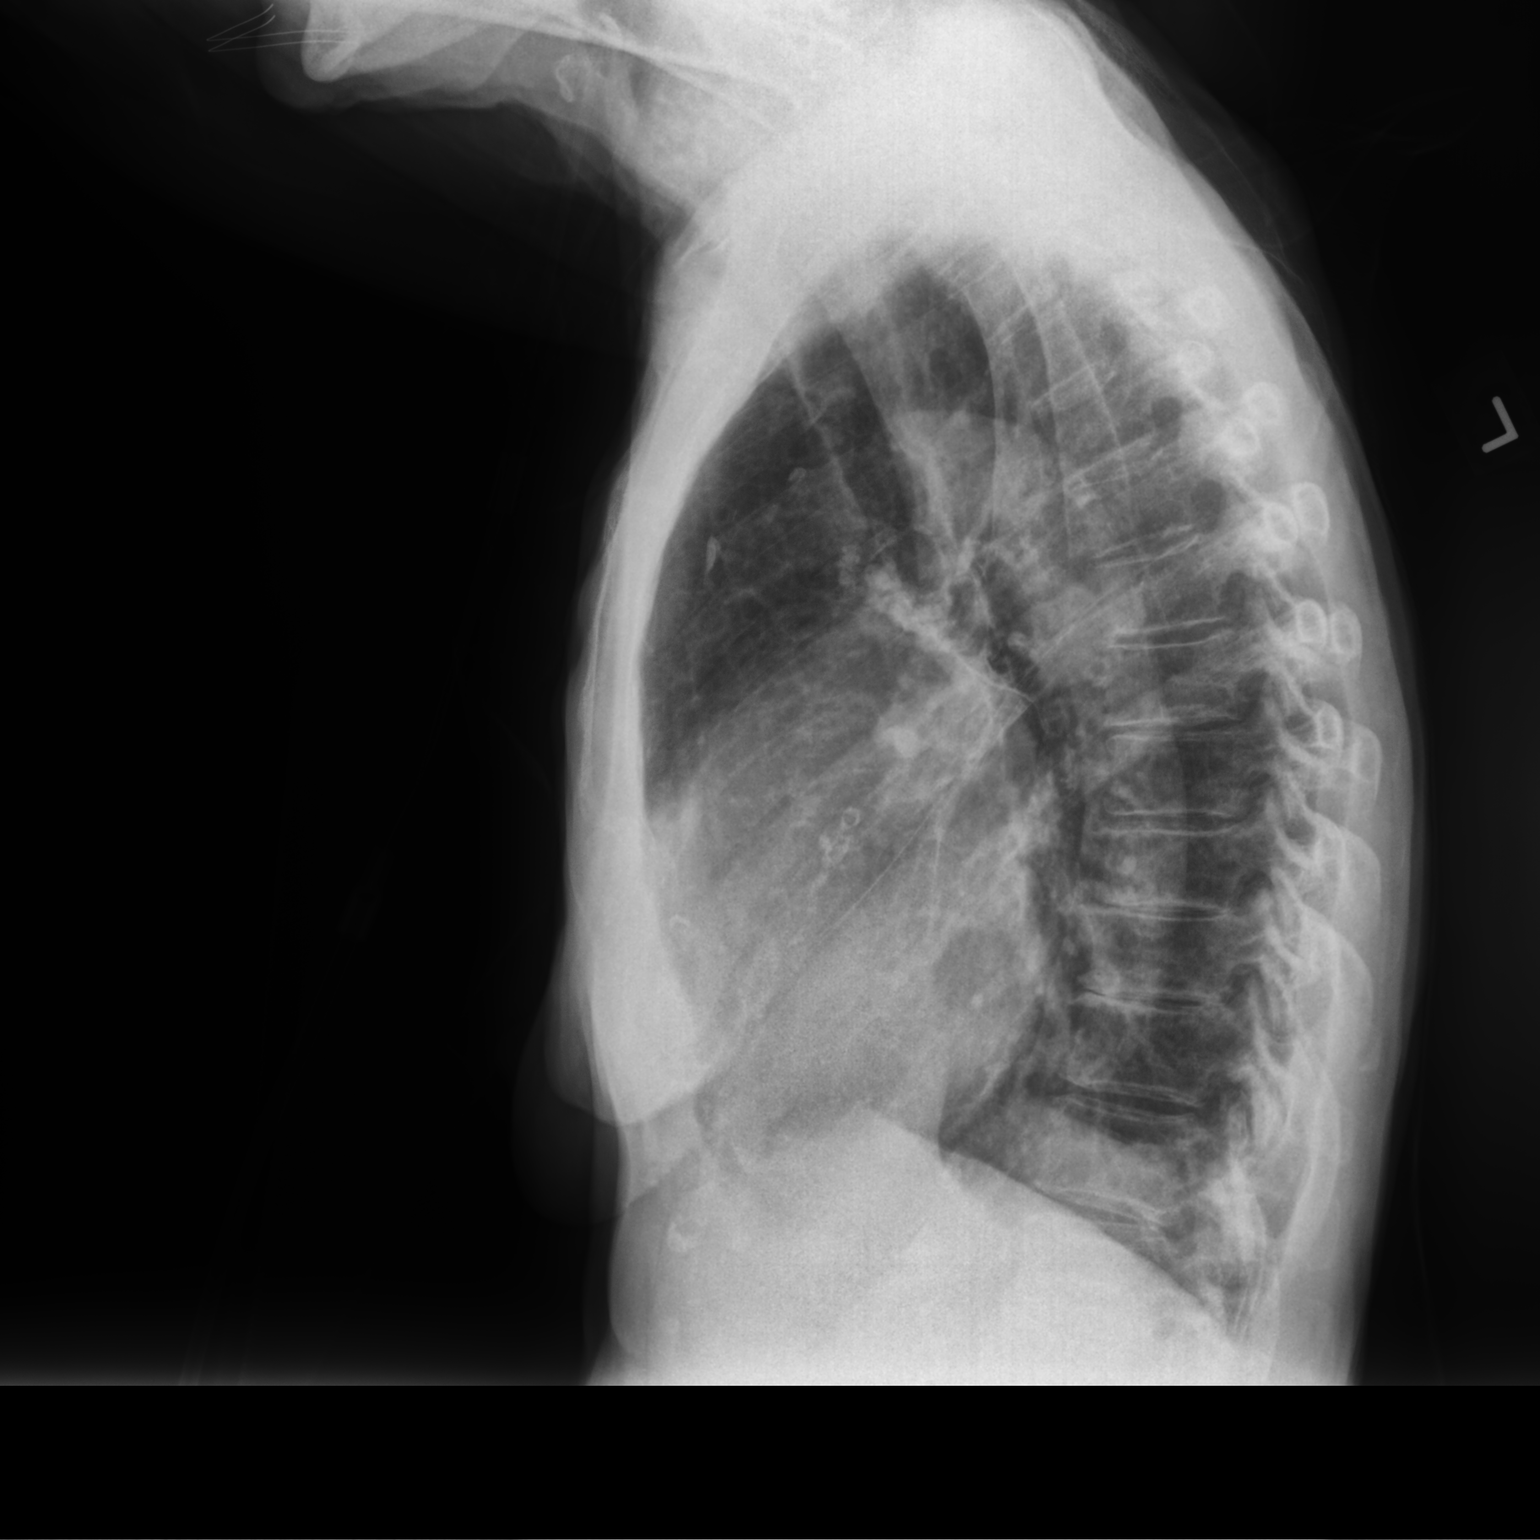

[2 of 2 positions shown; findings below may reference images not displayed]

FINDINGS: Frontal and lateral views of the chest demonstrates stable
enlargement of the cardiac silhouette. No acute airspace disease,
effusion, or pneumothorax. Mild increased bronchovascular prominence
since prior study which could reflect reactive airway disease or
viral pneumonitis. No acute bony abnormalities.
IMPRESSION: 1. Increased bilateral bronchovascular prominence, favor bronchitis
or reactive airway disease. No lobar pneumonia.
